# Patient Record
Sex: Female | Born: 1935 | Race: White | Hispanic: No | Smoking: Never smoker
Health system: Southern US, Community
[De-identification: ages and names within clinical notes are randomized; demographics above are authoritative.]

## PROBLEM LIST (undated history)

## (undated) DIAGNOSIS — N3010 Interstitial cystitis (chronic) without hematuria: Secondary | ICD-10-CM

## (undated) DIAGNOSIS — R109 Unspecified abdominal pain: Secondary | ICD-10-CM

## (undated) DIAGNOSIS — D649 Anemia, unspecified: Secondary | ICD-10-CM

## (undated) DIAGNOSIS — M899 Disorder of bone, unspecified: Secondary | ICD-10-CM

## (undated) DIAGNOSIS — B029 Zoster without complications: Secondary | ICD-10-CM

## (undated) DIAGNOSIS — R319 Hematuria, unspecified: Secondary | ICD-10-CM

## (undated) DIAGNOSIS — D473 Essential (hemorrhagic) thrombocythemia: Secondary | ICD-10-CM

## (undated) DIAGNOSIS — I454 Nonspecific intraventricular block: Secondary | ICD-10-CM

## (undated) DIAGNOSIS — M1990 Unspecified osteoarthritis, unspecified site: Secondary | ICD-10-CM

## (undated) DIAGNOSIS — M949 Disorder of cartilage, unspecified: Secondary | ICD-10-CM

## (undated) DIAGNOSIS — M545 Low back pain: Secondary | ICD-10-CM

## (undated) DIAGNOSIS — I499 Cardiac arrhythmia, unspecified: Secondary | ICD-10-CM

## (undated) DIAGNOSIS — B3321 Viral endocarditis: Secondary | ICD-10-CM

## (undated) DIAGNOSIS — S129XXA Fracture of neck, unspecified, initial encounter: Secondary | ICD-10-CM

## (undated) DIAGNOSIS — E785 Hyperlipidemia, unspecified: Secondary | ICD-10-CM

## (undated) DIAGNOSIS — R413 Other amnesia: Secondary | ICD-10-CM

## (undated) DIAGNOSIS — I4460 Unspecified fascicular block: Secondary | ICD-10-CM

## (undated) DIAGNOSIS — I10 Essential (primary) hypertension: Secondary | ICD-10-CM

## (undated) DIAGNOSIS — M5430 Sciatica, unspecified side: Secondary | ICD-10-CM

## (undated) DIAGNOSIS — R634 Abnormal weight loss: Secondary | ICD-10-CM

## (undated) DIAGNOSIS — K922 Gastrointestinal hemorrhage, unspecified: Secondary | ICD-10-CM

## (undated) DIAGNOSIS — K5730 Diverticulosis of large intestine without perforation or abscess without bleeding: Secondary | ICD-10-CM

## (undated) HISTORY — DX: Interstitial cystitis (chronic) without hematuria: N30.10

## (undated) HISTORY — DX: Unspecified fascicular block: I44.60

## (undated) HISTORY — DX: Disorder of bone, unspecified: M89.9

## (undated) HISTORY — PX: TONSILLECTOMY: SHX5217

## (undated) HISTORY — DX: Diverticulosis of large intestine without perforation or abscess without bleeding: K57.30

## (undated) HISTORY — DX: Low back pain: M54.5

## (undated) HISTORY — DX: Anemia, unspecified: D64.9

## (undated) HISTORY — DX: Essential (primary) hypertension: I10

## (undated) HISTORY — DX: Essential (hemorrhagic) thrombocythemia: D47.3

## (undated) HISTORY — DX: Hyperlipidemia, unspecified: E78.5

## (undated) HISTORY — DX: Unspecified osteoarthritis, unspecified site: M19.90

## (undated) HISTORY — DX: Zoster without complications: B02.9

## (undated) HISTORY — PX: CATARACT EXTRACTION W/ INTRAOCULAR LENS  IMPLANT, BILATERAL: SHX1307

## (undated) HISTORY — DX: Sciatica, unspecified side: M54.30

## (undated) HISTORY — DX: Hematuria, unspecified: R31.9

## (undated) HISTORY — DX: Unspecified abdominal pain: R10.9

## (undated) HISTORY — PX: APPENDECTOMY: SHX54

## (undated) HISTORY — DX: Other amnesia: R41.3

## (undated) HISTORY — PX: ABDOMINAL HYSTERECTOMY: SHX81

## (undated) HISTORY — DX: Disorder of cartilage, unspecified: M94.9

## (undated) HISTORY — DX: Fracture of neck, unspecified, initial encounter: S12.9XXA

## (undated) HISTORY — DX: Nonspecific intraventricular block: I45.4

## (undated) HISTORY — PX: BREAST CYST EXCISION: SHX579

## (undated) HISTORY — DX: Cardiac arrhythmia, unspecified: I49.9

## (undated) HISTORY — DX: Viral endocarditis: B33.21

## (undated) HISTORY — DX: Abnormal weight loss: R63.4

## (undated) HISTORY — DX: Gastrointestinal hemorrhage, unspecified: K92.2

---

## 1970-03-03 DIAGNOSIS — B3321 Viral endocarditis: Secondary | ICD-10-CM

## 1970-03-03 HISTORY — DX: Viral endocarditis: B33.21

## 2004-03-03 DIAGNOSIS — B029 Zoster without complications: Secondary | ICD-10-CM

## 2004-03-03 HISTORY — DX: Zoster without complications: B02.9

## 2004-04-22 DIAGNOSIS — R319 Hematuria, unspecified: Secondary | ICD-10-CM

## 2004-04-22 HISTORY — DX: Hematuria, unspecified: R31.9

## 2006-03-03 DIAGNOSIS — I499 Cardiac arrhythmia, unspecified: Secondary | ICD-10-CM

## 2006-03-03 DIAGNOSIS — M1990 Unspecified osteoarthritis, unspecified site: Secondary | ICD-10-CM

## 2006-03-03 DIAGNOSIS — I454 Nonspecific intraventricular block: Secondary | ICD-10-CM

## 2006-03-03 DIAGNOSIS — N3010 Interstitial cystitis (chronic) without hematuria: Secondary | ICD-10-CM

## 2006-03-03 HISTORY — DX: Interstitial cystitis (chronic) without hematuria: N30.10

## 2006-03-03 HISTORY — DX: Unspecified osteoarthritis, unspecified site: M19.90

## 2006-03-03 HISTORY — DX: Nonspecific intraventricular block: I45.4

## 2006-03-03 HISTORY — DX: Cardiac arrhythmia, unspecified: I49.9

## 2006-09-01 DIAGNOSIS — E785 Hyperlipidemia, unspecified: Secondary | ICD-10-CM

## 2006-09-01 HISTORY — DX: Hyperlipidemia, unspecified: E78.5

## 2007-03-09 DIAGNOSIS — K922 Gastrointestinal hemorrhage, unspecified: Secondary | ICD-10-CM

## 2007-03-09 HISTORY — DX: Gastrointestinal hemorrhage, unspecified: K92.2

## 2007-04-20 DIAGNOSIS — K5730 Diverticulosis of large intestine without perforation or abscess without bleeding: Secondary | ICD-10-CM

## 2007-04-20 DIAGNOSIS — M899 Disorder of bone, unspecified: Secondary | ICD-10-CM

## 2007-04-20 HISTORY — DX: Diverticulosis of large intestine without perforation or abscess without bleeding: K57.30

## 2007-04-20 HISTORY — DX: Disorder of bone, unspecified: M89.9

## 2009-12-19 DIAGNOSIS — R413 Other amnesia: Secondary | ICD-10-CM

## 2009-12-19 HISTORY — DX: Other amnesia: R41.3

## 2010-06-19 DIAGNOSIS — M5430 Sciatica, unspecified side: Secondary | ICD-10-CM

## 2010-06-19 HISTORY — DX: Sciatica, unspecified side: M54.30

## 2010-12-18 DIAGNOSIS — M545 Low back pain, unspecified: Secondary | ICD-10-CM

## 2010-12-18 HISTORY — DX: Low back pain, unspecified: M54.50

## 2011-06-18 ENCOUNTER — Other Ambulatory Visit: Payer: Self-pay | Admitting: Internal Medicine

## 2011-06-18 DIAGNOSIS — D649 Anemia, unspecified: Secondary | ICD-10-CM

## 2011-06-18 DIAGNOSIS — R634 Abnormal weight loss: Secondary | ICD-10-CM | POA: Insufficient documentation

## 2011-06-18 DIAGNOSIS — D473 Essential (hemorrhagic) thrombocythemia: Secondary | ICD-10-CM

## 2011-06-18 DIAGNOSIS — I4460 Unspecified fascicular block: Secondary | ICD-10-CM

## 2011-06-18 HISTORY — DX: Unspecified fascicular block: I44.60

## 2011-06-18 HISTORY — DX: Anemia, unspecified: D64.9

## 2011-06-18 HISTORY — DX: Essential (hemorrhagic) thrombocythemia: D47.3

## 2011-06-18 HISTORY — DX: Abnormal weight loss: R63.4

## 2011-06-19 ENCOUNTER — Ambulatory Visit
Admission: RE | Admit: 2011-06-19 | Discharge: 2011-06-19 | Disposition: A | Discharge status: Home / Self Care | Payer: Medicare Other | Source: Ambulatory Visit | Attending: Internal Medicine | Admitting: Internal Medicine

## 2011-06-26 ENCOUNTER — Other Ambulatory Visit: Payer: Self-pay | Admitting: Hematology and Oncology

## 2011-06-26 ENCOUNTER — Encounter (HOSPITAL_BASED_OUTPATIENT_CLINIC_OR_DEPARTMENT_OTHER): Payer: Medicare Other | Admitting: Hematology and Oncology

## 2011-06-26 ENCOUNTER — Encounter: Payer: Medicare Other | Admitting: Hematology and Oncology

## 2011-06-26 DIAGNOSIS — D649 Anemia, unspecified: Secondary | ICD-10-CM

## 2011-06-26 DIAGNOSIS — D473 Essential (hemorrhagic) thrombocythemia: Secondary | ICD-10-CM

## 2011-06-26 LAB — COMPREHENSIVE METABOLIC PANEL
ALT: 12 U/L (ref 0–35)
Albumin: 3.8 g/dL (ref 3.5–5.2)
CO2: 29 mEq/L (ref 19–32)
Chloride: 103 mEq/L (ref 96–112)
Glucose, Bld: 83 mg/dL (ref 70–99)
Potassium: 3.9 mEq/L (ref 3.5–5.3)
Sodium: 142 mEq/L (ref 135–145)
Total Protein: 6.7 g/dL (ref 6.0–8.3)

## 2011-06-26 LAB — CBC WITH DIFFERENTIAL/PLATELET
BASO%: 0.9 % (ref 0.0–2.0)
Basophils Absolute: 0.1 10e3/uL (ref 0.0–0.1)
EOS%: 1.9 % (ref 0.0–7.0)
Eosinophils Absolute: 0.1 10e3/uL (ref 0.0–0.5)
HCT: 35.2 % (ref 34.8–46.6)
HGB: 11.4 g/dL — ABNORMAL LOW (ref 11.6–15.9)
LYMPH%: 20.9 % (ref 14.0–49.7)
MCH: 29.9 pg (ref 25.1–34.0)
MCHC: 32.4 g/dL (ref 31.5–36.0)
MCV: 92.4 fL (ref 79.5–101.0)
MONO#: 0.6 10e3/uL (ref 0.1–0.9)
MONO%: 8.7 % (ref 0.0–14.0)
NEUT#: 4.3 10e3/uL (ref 1.5–6.5)
NEUT%: 67.6 % (ref 38.4–76.8)
Platelets: 1064 10e3/uL — ABNORMAL HIGH (ref 145–400)
RBC: 3.81 10e6/uL (ref 3.70–5.45)
RDW: 16.4 % — ABNORMAL HIGH (ref 11.2–14.5)
WBC: 6.4 10e3/uL (ref 3.9–10.3)
lymph#: 1.3 10e3/uL (ref 0.9–3.3)
nRBC: 0 % (ref 0–0)

## 2011-06-26 LAB — URINALYSIS, MICROSCOPIC - CHCC
Bilirubin (Urine): NEGATIVE
Ketones: NEGATIVE mg/dL
Specific Gravity, Urine: 1.01 (ref 1.003–1.035)

## 2011-06-26 LAB — MORPHOLOGY: PLT EST: INCREASED

## 2011-06-26 LAB — LACTATE DEHYDROGENASE: LDH: 585 U/L — ABNORMAL HIGH (ref 94–250)

## 2011-06-30 ENCOUNTER — Other Ambulatory Visit: Payer: Self-pay | Admitting: Hematology and Oncology

## 2011-06-30 DIAGNOSIS — D649 Anemia, unspecified: Secondary | ICD-10-CM

## 2011-06-30 LAB — JAK-2 V617F

## 2011-06-30 LAB — PROTEIN ELECTROPHORESIS, SERUM, WITH REFLEX
Alpha-1-Globulin: 5.4 % — ABNORMAL HIGH (ref 2.9–4.9)
Beta 2: 4.1 % (ref 3.2–6.5)
Gamma Globulin: 9.8 % — ABNORMAL LOW (ref 11.1–18.8)

## 2011-06-30 LAB — IRON AND TIBC
%SAT: 23 % (ref 20–55)
Iron: 84 ug/dL (ref 42–145)
TIBC: 364 ug/dL (ref 250–470)
UIBC: 280 ug/dL

## 2011-06-30 LAB — FERRITIN: Ferritin: 109 ng/mL (ref 10–291)

## 2011-07-01 ENCOUNTER — Encounter (HOSPITAL_BASED_OUTPATIENT_CLINIC_OR_DEPARTMENT_OTHER): Payer: Medicare Other | Admitting: Hematology and Oncology

## 2011-07-01 DIAGNOSIS — D473 Essential (hemorrhagic) thrombocythemia: Secondary | ICD-10-CM

## 2011-07-01 DIAGNOSIS — D649 Anemia, unspecified: Secondary | ICD-10-CM

## 2011-07-03 ENCOUNTER — Encounter (HOSPITAL_BASED_OUTPATIENT_CLINIC_OR_DEPARTMENT_OTHER): Payer: Medicare Other | Admitting: Hematology and Oncology

## 2011-07-03 ENCOUNTER — Other Ambulatory Visit: Payer: Self-pay | Admitting: Hematology and Oncology

## 2011-07-03 DIAGNOSIS — D649 Anemia, unspecified: Secondary | ICD-10-CM

## 2011-07-03 DIAGNOSIS — D473 Essential (hemorrhagic) thrombocythemia: Secondary | ICD-10-CM

## 2011-07-03 LAB — CBC WITH DIFFERENTIAL/PLATELET
Basophils Absolute: 0.1 10*3/uL (ref 0.0–0.1)
EOS%: 2.5 % (ref 0.0–7.0)
MCH: 29.3 pg (ref 25.1–34.0)
MCV: 92.3 fL (ref 79.5–101.0)
MONO%: 5.2 % (ref 0.0–14.0)
RBC: 3.65 10*6/uL — ABNORMAL LOW (ref 3.70–5.45)
RDW: 16.2 % — ABNORMAL HIGH (ref 11.2–14.5)
nRBC: 0 % (ref 0–0)

## 2011-07-08 ENCOUNTER — Encounter (HOSPITAL_BASED_OUTPATIENT_CLINIC_OR_DEPARTMENT_OTHER): Payer: Medicare Other | Admitting: Hematology and Oncology

## 2011-07-08 ENCOUNTER — Other Ambulatory Visit: Payer: Self-pay | Admitting: Hematology and Oncology

## 2011-07-08 DIAGNOSIS — D473 Essential (hemorrhagic) thrombocythemia: Secondary | ICD-10-CM

## 2011-07-08 DIAGNOSIS — D649 Anemia, unspecified: Secondary | ICD-10-CM

## 2011-07-08 LAB — CBC WITH DIFFERENTIAL/PLATELET
BASO%: 0.3 % (ref 0.0–2.0)
EOS%: 1.7 % (ref 0.0–7.0)
HCT: 32.2 % — ABNORMAL LOW (ref 34.8–46.6)
MCH: 30.8 pg (ref 25.1–34.0)
MCHC: 33.7 g/dL (ref 31.5–36.0)
MONO%: 1.2 % (ref 0.0–14.0)
NEUT%: 82.6 % — ABNORMAL HIGH (ref 38.4–76.8)
lymph#: 0.6 10*3/uL — ABNORMAL LOW (ref 0.9–3.3)

## 2011-07-08 LAB — BCR/ABL (LIO MMD)

## 2011-07-09 ENCOUNTER — Other Ambulatory Visit: Payer: Self-pay | Admitting: Hematology and Oncology

## 2011-07-09 ENCOUNTER — Encounter (HOSPITAL_BASED_OUTPATIENT_CLINIC_OR_DEPARTMENT_OTHER): Payer: Medicare Other | Admitting: Hematology and Oncology

## 2011-07-09 DIAGNOSIS — D649 Anemia, unspecified: Secondary | ICD-10-CM

## 2011-07-09 DIAGNOSIS — D473 Essential (hemorrhagic) thrombocythemia: Secondary | ICD-10-CM

## 2011-07-09 LAB — CBC WITH DIFFERENTIAL/PLATELET
BASO%: 0.8 % (ref 0.0–2.0)
Basophils Absolute: 0 10*3/uL (ref 0.0–0.1)
EOS%: 1.2 % (ref 0.0–7.0)
HGB: 10.8 g/dL — ABNORMAL LOW (ref 11.6–15.9)
MCH: 31 pg (ref 25.1–34.0)
MCHC: 33.7 g/dL (ref 31.5–36.0)
RBC: 3.49 10*6/uL — ABNORMAL LOW (ref 3.70–5.45)
RDW: 16.8 % — ABNORMAL HIGH (ref 11.2–14.5)
lymph#: 0.5 10*3/uL — ABNORMAL LOW (ref 0.9–3.3)

## 2011-07-14 ENCOUNTER — Other Ambulatory Visit: Payer: Self-pay | Admitting: Hematology and Oncology

## 2011-07-14 ENCOUNTER — Encounter (HOSPITAL_BASED_OUTPATIENT_CLINIC_OR_DEPARTMENT_OTHER): Payer: Medicare Other | Admitting: Hematology and Oncology

## 2011-07-14 LAB — CBC WITH DIFFERENTIAL/PLATELET
Basophils Absolute: 0 10*3/uL (ref 0.0–0.1)
Eosinophils Absolute: 0 10*3/uL (ref 0.0–0.5)
HGB: 10.1 g/dL — ABNORMAL LOW (ref 11.6–15.9)
MONO#: 0 10*3/uL — ABNORMAL LOW (ref 0.1–0.9)
NEUT#: 0.3 10*3/uL — CL (ref 1.5–6.5)
RDW: 15.2 % — ABNORMAL HIGH (ref 11.2–14.5)
WBC: 1 10*3/uL — ABNORMAL LOW (ref 3.9–10.3)
lymph#: 0.7 10*3/uL — ABNORMAL LOW (ref 0.9–3.3)
nRBC: 0 % (ref 0–0)

## 2011-07-17 ENCOUNTER — Encounter: Payer: Medicare Other | Admitting: Hematology and Oncology

## 2011-07-17 ENCOUNTER — Other Ambulatory Visit: Payer: Self-pay | Admitting: Hematology and Oncology

## 2011-07-17 DIAGNOSIS — D473 Essential (hemorrhagic) thrombocythemia: Secondary | ICD-10-CM

## 2011-07-17 DIAGNOSIS — D649 Anemia, unspecified: Secondary | ICD-10-CM

## 2011-07-17 LAB — CBC WITH DIFFERENTIAL/PLATELET
BASO%: 3.1 % — ABNORMAL HIGH (ref 0.0–2.0)
Basophils Absolute: 0 10*3/uL (ref 0.0–0.1)
HCT: 29.5 % — ABNORMAL LOW (ref 34.8–46.6)
HGB: 10.2 g/dL — ABNORMAL LOW (ref 11.6–15.9)
MONO#: 0 10*3/uL — ABNORMAL LOW (ref 0.1–0.9)
NEUT%: 21.8 % — ABNORMAL LOW (ref 38.4–76.8)
RDW: 16.3 % — ABNORMAL HIGH (ref 11.2–14.5)
WBC: 1 10*3/uL — ABNORMAL LOW (ref 3.9–10.3)
lymph#: 0.7 10*3/uL — ABNORMAL LOW (ref 0.9–3.3)

## 2011-07-21 ENCOUNTER — Other Ambulatory Visit: Payer: Self-pay | Admitting: Hematology and Oncology

## 2011-07-21 ENCOUNTER — Encounter (HOSPITAL_BASED_OUTPATIENT_CLINIC_OR_DEPARTMENT_OTHER): Payer: Medicare Other | Admitting: Hematology and Oncology

## 2011-07-21 DIAGNOSIS — D649 Anemia, unspecified: Secondary | ICD-10-CM

## 2011-07-21 DIAGNOSIS — D473 Essential (hemorrhagic) thrombocythemia: Secondary | ICD-10-CM

## 2011-07-21 LAB — CBC WITH DIFFERENTIAL/PLATELET
BASO%: 1.6 % (ref 0.0–2.0)
Basophils Absolute: 0 10e3/uL (ref 0.0–0.1)
EOS%: 0.7 % (ref 0.0–7.0)
Eosinophils Absolute: 0 10e3/uL (ref 0.0–0.5)
HCT: 29.1 % — ABNORMAL LOW (ref 34.8–46.6)
HGB: 9.9 g/dL — ABNORMAL LOW (ref 11.6–15.9)
LYMPH%: 45 % (ref 14.0–49.7)
MCH: 31.5 pg (ref 25.1–34.0)
MCHC: 33.9 g/dL (ref 31.5–36.0)
MCV: 92.9 fL (ref 79.5–101.0)
MONO#: 0.2 10e3/uL (ref 0.1–0.9)
MONO%: 11.4 % (ref 0.0–14.0)
NEUT#: 0.8 10e3/uL — ABNORMAL LOW (ref 1.5–6.5)
NEUT%: 41.3 % (ref 38.4–76.8)
Platelets: 106 10e3/uL — ABNORMAL LOW (ref 145–400)
RBC: 3.13 10e6/uL — ABNORMAL LOW (ref 3.70–5.45)
RDW: 16.3 % — ABNORMAL HIGH (ref 11.2–14.5)
WBC: 1.9 10e3/uL — ABNORMAL LOW (ref 3.9–10.3)
lymph#: 0.8 10e3/uL — ABNORMAL LOW (ref 0.9–3.3)

## 2011-07-28 ENCOUNTER — Encounter (HOSPITAL_BASED_OUTPATIENT_CLINIC_OR_DEPARTMENT_OTHER): Payer: Medicare Other | Admitting: Hematology and Oncology

## 2011-07-28 ENCOUNTER — Other Ambulatory Visit: Payer: Self-pay | Admitting: Hematology and Oncology

## 2011-07-28 DIAGNOSIS — D649 Anemia, unspecified: Secondary | ICD-10-CM

## 2011-07-28 DIAGNOSIS — D473 Essential (hemorrhagic) thrombocythemia: Secondary | ICD-10-CM

## 2011-07-28 LAB — CBC WITH DIFFERENTIAL/PLATELET
Basophils Absolute: 0 10*3/uL (ref 0.0–0.1)
EOS%: 1.9 % (ref 0.0–7.0)
Eosinophils Absolute: 0.1 10*3/uL (ref 0.0–0.5)
HGB: 10.1 g/dL — ABNORMAL LOW (ref 11.6–15.9)
LYMPH%: 28 % (ref 14.0–49.7)
MCH: 32.6 pg (ref 25.1–34.0)
MCV: 95.9 fL (ref 79.5–101.0)
MONO%: 9.2 % (ref 0.0–14.0)
NEUT#: 2 10*3/uL (ref 1.5–6.5)
NEUT%: 60.4 % (ref 38.4–76.8)
Platelets: 429 10*3/uL — ABNORMAL HIGH (ref 145–400)
RDW: 18.8 % — ABNORMAL HIGH (ref 11.2–14.5)

## 2011-08-05 ENCOUNTER — Encounter (HOSPITAL_BASED_OUTPATIENT_CLINIC_OR_DEPARTMENT_OTHER): Payer: Medicare Other | Admitting: Hematology and Oncology

## 2011-08-05 ENCOUNTER — Other Ambulatory Visit: Payer: Self-pay | Admitting: Hematology and Oncology

## 2011-08-05 DIAGNOSIS — D473 Essential (hemorrhagic) thrombocythemia: Secondary | ICD-10-CM

## 2011-08-05 DIAGNOSIS — D649 Anemia, unspecified: Secondary | ICD-10-CM

## 2011-08-05 LAB — CBC WITH DIFFERENTIAL/PLATELET
BASO%: 0.4 % (ref 0.0–2.0)
Basophils Absolute: 0 10*3/uL (ref 0.0–0.1)
MCH: 32.3 pg (ref 25.1–34.0)
MCHC: 33.4 g/dL (ref 31.5–36.0)
MCV: 96.7 fL (ref 79.5–101.0)
MONO#: 0.7 10*3/uL (ref 0.1–0.9)
NEUT#: 4.2 10*3/uL (ref 1.5–6.5)
NEUT%: 64.9 % (ref 38.4–76.8)
Platelets: 736 10*3/uL — ABNORMAL HIGH (ref 145–400)
RBC: 3.28 10*6/uL — ABNORMAL LOW (ref 3.70–5.45)
lymph#: 1.5 10*3/uL (ref 0.9–3.3)

## 2011-08-05 LAB — BASIC METABOLIC PANEL
BUN: 17 mg/dL (ref 6–23)
CO2: 26 mEq/L (ref 19–32)
Chloride: 106 mEq/L (ref 96–112)
Creatinine, Ser: 0.9 mg/dL (ref 0.50–1.10)

## 2011-08-12 ENCOUNTER — Other Ambulatory Visit: Payer: Self-pay | Admitting: Hematology and Oncology

## 2011-08-12 ENCOUNTER — Encounter (HOSPITAL_BASED_OUTPATIENT_CLINIC_OR_DEPARTMENT_OTHER): Payer: Medicare Other | Admitting: Hematology and Oncology

## 2011-08-12 DIAGNOSIS — D473 Essential (hemorrhagic) thrombocythemia: Secondary | ICD-10-CM

## 2011-08-12 DIAGNOSIS — D649 Anemia, unspecified: Secondary | ICD-10-CM

## 2011-08-12 LAB — CBC WITH DIFFERENTIAL/PLATELET
Basophils Absolute: 0 10*3/uL (ref 0.0–0.1)
EOS%: 0.7 % (ref 0.0–7.0)
HGB: 10.9 g/dL — ABNORMAL LOW (ref 11.6–15.9)
LYMPH%: 22.5 % (ref 14.0–49.7)
MCH: 32.9 pg (ref 25.1–34.0)
MCV: 96.5 fL (ref 79.5–101.0)
MONO%: 8 % (ref 0.0–14.0)
RDW: 21.4 % — ABNORMAL HIGH (ref 11.2–14.5)

## 2011-08-19 ENCOUNTER — Encounter (HOSPITAL_BASED_OUTPATIENT_CLINIC_OR_DEPARTMENT_OTHER): Payer: Medicare Other | Admitting: Hematology and Oncology

## 2011-08-19 ENCOUNTER — Other Ambulatory Visit: Payer: Self-pay | Admitting: Hematology and Oncology

## 2011-08-19 DIAGNOSIS — D649 Anemia, unspecified: Secondary | ICD-10-CM

## 2011-08-19 DIAGNOSIS — D473 Essential (hemorrhagic) thrombocythemia: Secondary | ICD-10-CM

## 2011-08-19 LAB — CBC WITH DIFFERENTIAL/PLATELET
BASO%: 0.4 % (ref 0.0–2.0)
Basophils Absolute: 0 10*3/uL (ref 0.0–0.1)
EOS%: 1.6 % (ref 0.0–7.0)
HCT: 34.2 % — ABNORMAL LOW (ref 34.8–46.6)
HGB: 11.4 g/dL — ABNORMAL LOW (ref 11.6–15.9)
MCH: 32.7 pg (ref 25.1–34.0)
MCHC: 33.2 g/dL (ref 31.5–36.0)
MONO#: 0.3 10*3/uL (ref 0.1–0.9)
NEUT%: 71.4 % (ref 38.4–76.8)
RDW: 22.3 % — ABNORMAL HIGH (ref 11.2–14.5)
WBC: 6.2 10*3/uL (ref 3.9–10.3)
lymph#: 1.3 10*3/uL (ref 0.9–3.3)

## 2011-08-26 ENCOUNTER — Encounter (HOSPITAL_BASED_OUTPATIENT_CLINIC_OR_DEPARTMENT_OTHER): Payer: Medicare Other | Admitting: Hematology and Oncology

## 2011-08-26 ENCOUNTER — Other Ambulatory Visit: Payer: Self-pay | Admitting: Hematology and Oncology

## 2011-08-26 DIAGNOSIS — D473 Essential (hemorrhagic) thrombocythemia: Secondary | ICD-10-CM

## 2011-08-26 DIAGNOSIS — D649 Anemia, unspecified: Secondary | ICD-10-CM

## 2011-08-26 LAB — CBC WITH DIFFERENTIAL/PLATELET
Basophils Absolute: 0 10*3/uL (ref 0.0–0.1)
Eosinophils Absolute: 0.2 10*3/uL (ref 0.0–0.5)
HCT: 34.7 % — ABNORMAL LOW (ref 34.8–46.6)
HGB: 11.7 g/dL (ref 11.6–15.9)
NEUT#: 4.1 10*3/uL (ref 1.5–6.5)
NEUT%: 69.3 % (ref 38.4–76.8)
RDW: 24 % — ABNORMAL HIGH (ref 11.2–14.5)
lymph#: 1.1 10*3/uL (ref 0.9–3.3)

## 2011-09-09 ENCOUNTER — Other Ambulatory Visit: Payer: Self-pay | Admitting: Hematology and Oncology

## 2011-09-09 ENCOUNTER — Encounter (HOSPITAL_BASED_OUTPATIENT_CLINIC_OR_DEPARTMENT_OTHER): Payer: Medicare Other | Admitting: Hematology and Oncology

## 2011-09-09 DIAGNOSIS — D473 Essential (hemorrhagic) thrombocythemia: Secondary | ICD-10-CM

## 2011-09-09 DIAGNOSIS — D649 Anemia, unspecified: Secondary | ICD-10-CM

## 2011-09-09 LAB — CBC WITH DIFFERENTIAL/PLATELET
Basophils Absolute: 0 10*3/uL (ref 0.0–0.1)
Eosinophils Absolute: 0.1 10*3/uL (ref 0.0–0.5)
HCT: 33.6 % — ABNORMAL LOW (ref 34.8–46.6)
HGB: 11.1 g/dL — ABNORMAL LOW (ref 11.6–15.9)
LYMPH%: 25.9 % (ref 14.0–49.7)
MCH: 34.2 pg — ABNORMAL HIGH (ref 25.1–34.0)
MCV: 103.2 fL — ABNORMAL HIGH (ref 79.5–101.0)
MONO%: 6.8 % (ref 0.0–14.0)
NEUT#: 3.3 10*3/uL (ref 1.5–6.5)
NEUT%: 64.5 % (ref 38.4–76.8)
Platelets: 387 10*3/uL (ref 145–400)

## 2011-09-09 LAB — BASIC METABOLIC PANEL
BUN: 22 mg/dL (ref 6–23)
Creatinine, Ser: 0.87 mg/dL (ref 0.50–1.10)
Glucose, Bld: 98 mg/dL (ref 70–99)
Potassium: 4.1 mEq/L (ref 3.5–5.3)

## 2011-09-23 ENCOUNTER — Other Ambulatory Visit (HOSPITAL_BASED_OUTPATIENT_CLINIC_OR_DEPARTMENT_OTHER): Payer: Medicare Other

## 2011-09-23 ENCOUNTER — Other Ambulatory Visit: Payer: Self-pay | Admitting: Hematology and Oncology

## 2011-09-23 ENCOUNTER — Telehealth: Payer: Self-pay | Admitting: Nurse Practitioner

## 2011-09-23 DIAGNOSIS — D473 Essential (hemorrhagic) thrombocythemia: Secondary | ICD-10-CM

## 2011-09-23 DIAGNOSIS — D649 Anemia, unspecified: Secondary | ICD-10-CM

## 2011-09-23 LAB — CBC WITH DIFFERENTIAL/PLATELET
BASO%: 0.9 % (ref 0.0–2.0)
LYMPH%: 24 % (ref 14.0–49.7)
MCHC: 33.6 g/dL (ref 31.5–36.0)
MONO#: 0.4 10*3/uL (ref 0.1–0.9)
MONO%: 9.9 % (ref 0.0–14.0)
Platelets: 321 10*3/uL (ref 145–400)
RBC: 3.31 10*6/uL — ABNORMAL LOW (ref 3.70–5.45)
RDW: 27.8 % — ABNORMAL HIGH (ref 11.2–14.5)
WBC: 4.1 10*3/uL (ref 3.9–10.3)

## 2011-09-23 NOTE — Telephone Encounter (Signed)
Spoke with patient.  Instructed per Dr. Dalene Carrow- continue with Hydrea 1000mg /500mg  alternating daily.  Confirmed next lab appointment for 10/07/11.

## 2011-10-07 ENCOUNTER — Other Ambulatory Visit (HOSPITAL_BASED_OUTPATIENT_CLINIC_OR_DEPARTMENT_OTHER): Payer: Medicare Other | Admitting: Lab

## 2011-10-07 ENCOUNTER — Telehealth: Payer: Self-pay | Admitting: *Deleted

## 2011-10-07 ENCOUNTER — Other Ambulatory Visit: Payer: Self-pay | Admitting: *Deleted

## 2011-10-07 ENCOUNTER — Other Ambulatory Visit: Payer: Self-pay | Admitting: Hematology and Oncology

## 2011-10-07 DIAGNOSIS — D649 Anemia, unspecified: Secondary | ICD-10-CM

## 2011-10-07 DIAGNOSIS — D473 Essential (hemorrhagic) thrombocythemia: Secondary | ICD-10-CM

## 2011-10-07 LAB — CBC WITH DIFFERENTIAL/PLATELET
Eosinophils Absolute: 0.1 10*3/uL (ref 0.0–0.5)
HGB: 11.9 g/dL (ref 11.6–15.9)
MONO#: 0.3 10*3/uL (ref 0.1–0.9)
NEUT#: 2.7 10*3/uL (ref 1.5–6.5)
RBC: 3.18 10*6/uL — ABNORMAL LOW (ref 3.70–5.45)
RDW: 26.9 % — ABNORMAL HIGH (ref 11.2–14.5)
WBC: 4.2 10*3/uL (ref 3.9–10.3)

## 2011-10-07 NOTE — Telephone Encounter (Signed)
Dr. Dalene Carrow reviewed lab results today.   Spoke with pt at home and instructed pt re:   Continue with  Hydrea  500 mg  Alternate with  1000 mg  As per md.    Gave pt date and time for lab  In 1 month  11/06/11  At  10  AM.

## 2011-10-08 ENCOUNTER — Telehealth: Payer: Self-pay | Admitting: Hematology and Oncology

## 2011-10-08 NOTE — Telephone Encounter (Signed)
lmonvm for pt re appt for 12/28 @ 10 am d/t per orders

## 2011-10-08 NOTE — Telephone Encounter (Signed)
lmonvm for pt re appt for 12/28 @ 10 am. Dec/feb schedule mailed today.

## 2011-11-06 ENCOUNTER — Telehealth: Payer: Self-pay | Admitting: *Deleted

## 2011-11-06 ENCOUNTER — Other Ambulatory Visit (HOSPITAL_BASED_OUTPATIENT_CLINIC_OR_DEPARTMENT_OTHER): Payer: Medicare Other | Admitting: Lab

## 2011-11-06 DIAGNOSIS — D473 Essential (hemorrhagic) thrombocythemia: Secondary | ICD-10-CM

## 2011-11-06 LAB — CBC WITH DIFFERENTIAL/PLATELET
Basophils Absolute: 0 10*3/uL (ref 0.0–0.1)
Eosinophils Absolute: 0.1 10*3/uL (ref 0.0–0.5)
HCT: 35.3 % (ref 34.8–46.6)
HGB: 12.1 g/dL (ref 11.6–15.9)
LYMPH%: 23.8 % (ref 14.0–49.7)
MONO#: 0.3 10*3/uL (ref 0.1–0.9)
NEUT#: 2.5 10*3/uL (ref 1.5–6.5)
NEUT%: 66 % (ref 38.4–76.8)
Platelets: 312 10*3/uL (ref 145–400)
WBC: 3.9 10*3/uL (ref 3.9–10.3)

## 2011-11-06 NOTE — Telephone Encounter (Signed)
Duplicate

## 2011-11-11 ENCOUNTER — Other Ambulatory Visit: Payer: Self-pay | Admitting: Nurse Practitioner

## 2011-11-11 DIAGNOSIS — D473 Essential (hemorrhagic) thrombocythemia: Secondary | ICD-10-CM

## 2011-11-11 MED ORDER — HYDROXYUREA 500 MG PO CAPS: 500.0000 mg | ORAL_CAPSULE | ORAL | Status: DC

## 2011-12-10 DIAGNOSIS — E785 Hyperlipidemia, unspecified: Secondary | ICD-10-CM | POA: Diagnosis not present

## 2011-12-10 DIAGNOSIS — I10 Essential (primary) hypertension: Secondary | ICD-10-CM | POA: Diagnosis not present

## 2011-12-10 DIAGNOSIS — D649 Anemia, unspecified: Secondary | ICD-10-CM | POA: Diagnosis not present

## 2011-12-21 ENCOUNTER — Other Ambulatory Visit: Payer: Self-pay | Admitting: Cardiology

## 2011-12-31 DIAGNOSIS — D473 Essential (hemorrhagic) thrombocythemia: Secondary | ICD-10-CM | POA: Diagnosis not present

## 2011-12-31 DIAGNOSIS — H26499 Other secondary cataract, unspecified eye: Secondary | ICD-10-CM | POA: Diagnosis not present

## 2011-12-31 DIAGNOSIS — H40019 Open angle with borderline findings, low risk, unspecified eye: Secondary | ICD-10-CM | POA: Diagnosis not present

## 2011-12-31 DIAGNOSIS — H43819 Vitreous degeneration, unspecified eye: Secondary | ICD-10-CM | POA: Diagnosis not present

## 2011-12-31 DIAGNOSIS — D649 Anemia, unspecified: Secondary | ICD-10-CM | POA: Diagnosis not present

## 2011-12-31 DIAGNOSIS — E785 Hyperlipidemia, unspecified: Secondary | ICD-10-CM | POA: Diagnosis not present

## 2011-12-31 DIAGNOSIS — Z961 Presence of intraocular lens: Secondary | ICD-10-CM | POA: Diagnosis not present

## 2011-12-31 DIAGNOSIS — I10 Essential (primary) hypertension: Secondary | ICD-10-CM | POA: Diagnosis not present

## 2012-01-06 ENCOUNTER — Encounter: Payer: Self-pay | Admitting: Hematology and Oncology

## 2012-01-06 ENCOUNTER — Other Ambulatory Visit (HOSPITAL_BASED_OUTPATIENT_CLINIC_OR_DEPARTMENT_OTHER): Payer: Medicare Other | Admitting: Lab

## 2012-01-06 ENCOUNTER — Ambulatory Visit (HOSPITAL_BASED_OUTPATIENT_CLINIC_OR_DEPARTMENT_OTHER): Payer: Medicare Other | Admitting: Hematology and Oncology

## 2012-01-06 ENCOUNTER — Telehealth: Payer: Self-pay | Admitting: Hematology and Oncology

## 2012-01-06 VITALS — BP 140/74 | HR 55 | Temp 97.4°F | Ht 61.0 in | Wt 100.1 lb

## 2012-01-06 DIAGNOSIS — D649 Anemia, unspecified: Secondary | ICD-10-CM | POA: Diagnosis not present

## 2012-01-06 DIAGNOSIS — H409 Unspecified glaucoma: Secondary | ICD-10-CM | POA: Insufficient documentation

## 2012-01-06 DIAGNOSIS — D473 Essential (hemorrhagic) thrombocythemia: Secondary | ICD-10-CM

## 2012-01-06 LAB — CBC WITH DIFFERENTIAL/PLATELET
Basophils Absolute: 0 10*3/uL (ref 0.0–0.1)
EOS%: 0.8 % (ref 0.0–7.0)
Eosinophils Absolute: 0 10*3/uL (ref 0.0–0.5)
HCT: 34.8 % (ref 34.8–46.6)
HGB: 11.7 g/dL (ref 11.6–15.9)
LYMPH%: 22.8 % (ref 14.0–49.7)
MCH: 42.5 pg — ABNORMAL HIGH (ref 25.1–34.0)
MCV: 126.3 fL — ABNORMAL HIGH (ref 79.5–101.0)
MONO%: 7.3 % (ref 0.0–14.0)
NEUT%: 68.7 % (ref 38.4–76.8)
Platelets: 306 10*3/uL (ref 145–400)

## 2012-01-06 LAB — BASIC METABOLIC PANEL
BUN: 17 mg/dL (ref 6–23)
Creatinine, Ser: 0.85 mg/dL (ref 0.50–1.10)
Glucose, Bld: 80 mg/dL (ref 70–99)

## 2012-01-06 NOTE — Progress Notes (Signed)
CC:   Lenon Curt. Chilton Si, M.D. Excell Seltzer. Annabell Howells, M.D. Georga Hacking, M.D.  IDENTIFYING STATEMENT:  The patient is an 78 year old woman with essential thrombocytosis who presents for followup.  INTERVAL HISTORY:  Mrs. Ishida has no current issues or concerns.  She is tolerating Hydrea with very minimal difficulties.  She denies bursitis.  She has good energy level.  CBC obtained on 01/06/2012:  White cell count 4, hemoglobin 11.7, hematocrit 34, platelets 306 (212).  MEDICATIONS:  Reviewed and updated.  PHYSICAL EXAMINATION:  General Appearance:  The patient is alert and oriented x3.  Vital Signs:  Pulse 55.  Blood pressure 140/74. Temperature 97.4.  Respirations 20.  Weight 100 pounds.  HEENT:  Had is atraumatic, normocephalic.  Sclerae are anicteric.  Mouth is moist. Chest:  Clear.  Abdomen:  Soft.  Bowel sounds present.  Extremities:  No edema.  LABORATORY DATA:  As above.  IMPRESSION AND PLAN:  Mrs. Campus is an 78 year old woman with essential thrombocytosis.  She was initiated on Hydrea on 07/05/2011.  Her lab parameters remain unremarkable.  She will continue with Hydrea at the present dose of 100 mg alternating with 500 mg daily.  She follows up with labs in 2 months' time.  She has a clinic visit in 6 months' time.    ______________________________ Laurice Record, M.D. LIO/MEDQ  D:  01/06/2012  T:  01/06/2012  Job:  [id number]

## 2012-01-06 NOTE — Patient Instructions (Signed)
Patient to follow up as instructed.  

## 2012-01-06 NOTE — Telephone Encounter (Signed)
appts made and printed for may and aug  aom

## 2012-01-06 NOTE — Progress Notes (Signed)
This office note has been dictated.

## 2012-02-11 DIAGNOSIS — H01009 Unspecified blepharitis unspecified eye, unspecified eyelid: Secondary | ICD-10-CM | POA: Diagnosis not present

## 2012-02-11 DIAGNOSIS — H4011X0 Primary open-angle glaucoma, stage unspecified: Secondary | ICD-10-CM | POA: Diagnosis not present

## 2012-02-11 DIAGNOSIS — H409 Unspecified glaucoma: Secondary | ICD-10-CM | POA: Diagnosis not present

## 2012-03-23 ENCOUNTER — Other Ambulatory Visit (HOSPITAL_BASED_OUTPATIENT_CLINIC_OR_DEPARTMENT_OTHER): Payer: Medicare Other | Admitting: Lab

## 2012-03-23 ENCOUNTER — Other Ambulatory Visit: Payer: Self-pay | Admitting: *Deleted

## 2012-03-23 ENCOUNTER — Telehealth: Payer: Self-pay | Admitting: *Deleted

## 2012-03-23 DIAGNOSIS — D473 Essential (hemorrhagic) thrombocythemia: Secondary | ICD-10-CM | POA: Diagnosis not present

## 2012-03-23 LAB — CBC WITH DIFFERENTIAL/PLATELET
BASO%: 0.9 % (ref 0.0–2.0)
Eosinophils Absolute: 0 10*3/uL (ref 0.0–0.5)
LYMPH%: 27.3 % (ref 14.0–49.7)
MCHC: 34.1 g/dL (ref 31.5–36.0)
MCV: 129.9 fL — ABNORMAL HIGH (ref 79.5–101.0)
MONO#: 0.4 10*3/uL (ref 0.1–0.9)
MONO%: 12.9 % (ref 0.0–14.0)
NEUT#: 2 10*3/uL (ref 1.5–6.5)
RBC: 2.6 10*6/uL — ABNORMAL LOW (ref 3.70–5.45)
RDW: 13.3 % (ref 11.2–14.5)
WBC: 3.4 10*3/uL — ABNORMAL LOW (ref 3.9–10.3)
nRBC: 0 % (ref 0–0)

## 2012-03-23 NOTE — Telephone Encounter (Signed)
Dr. Dalene Carrow reviewed lab results today.   Spoke with pt and instructed pt to continue with  Hydrea  500 mg  Alternate with  1000 mg  As per md.   Gave pt date and time for lab in 8 weeks  05/25/12 at 0830 am.   Confirmed f/u appt with pt for 06/24/12.    Pt voiced understanding.

## 2012-04-25 DIAGNOSIS — N3010 Interstitial cystitis (chronic) without hematuria: Secondary | ICD-10-CM | POA: Diagnosis not present

## 2012-05-02 DIAGNOSIS — N390 Urinary tract infection, site not specified: Secondary | ICD-10-CM | POA: Diagnosis not present

## 2012-05-09 DIAGNOSIS — N390 Urinary tract infection, site not specified: Secondary | ICD-10-CM | POA: Diagnosis not present

## 2012-05-25 ENCOUNTER — Other Ambulatory Visit (HOSPITAL_BASED_OUTPATIENT_CLINIC_OR_DEPARTMENT_OTHER): Payer: Medicare Other | Admitting: Lab

## 2012-05-25 ENCOUNTER — Telehealth: Payer: Self-pay | Admitting: *Deleted

## 2012-05-25 DIAGNOSIS — D473 Essential (hemorrhagic) thrombocythemia: Secondary | ICD-10-CM | POA: Diagnosis not present

## 2012-05-25 LAB — CBC WITH DIFFERENTIAL/PLATELET
Basophils Absolute: 0 10*3/uL (ref 0.0–0.1)
Eosinophils Absolute: 0 10*3/uL (ref 0.0–0.5)
HGB: 11.9 g/dL (ref 11.6–15.9)
LYMPH%: 26.2 % (ref 14.0–49.7)
MONO#: 0.3 10*3/uL (ref 0.1–0.9)
NEUT#: 1.8 10*3/uL (ref 1.5–6.5)
Platelets: 243 10*3/uL (ref 145–400)
RBC: 2.7 10*6/uL — ABNORMAL LOW (ref 3.70–5.45)
WBC: 2.9 10*3/uL — ABNORMAL LOW (ref 3.9–10.3)

## 2012-05-25 NOTE — Telephone Encounter (Signed)
Spoke with pt and instructed pt re:  Continue with  Hydrea  500 mg  Alternate with  1000 mg  As per md.   Confirmed next appt  For 06/24/12.   Pt voiced understanding.

## 2012-06-21 DIAGNOSIS — I10 Essential (primary) hypertension: Secondary | ICD-10-CM | POA: Diagnosis not present

## 2012-06-21 DIAGNOSIS — E785 Hyperlipidemia, unspecified: Secondary | ICD-10-CM | POA: Diagnosis not present

## 2012-06-21 DIAGNOSIS — D649 Anemia, unspecified: Secondary | ICD-10-CM | POA: Diagnosis not present

## 2012-06-24 ENCOUNTER — Other Ambulatory Visit (HOSPITAL_BASED_OUTPATIENT_CLINIC_OR_DEPARTMENT_OTHER): Payer: Medicare Other | Admitting: Lab

## 2012-06-24 ENCOUNTER — Ambulatory Visit (HOSPITAL_BASED_OUTPATIENT_CLINIC_OR_DEPARTMENT_OTHER): Payer: Medicare Other | Admitting: Hematology and Oncology

## 2012-06-24 ENCOUNTER — Telehealth: Payer: Self-pay | Admitting: Hematology and Oncology

## 2012-06-24 ENCOUNTER — Encounter: Payer: Self-pay | Admitting: Hematology and Oncology

## 2012-06-24 VITALS — BP 145/68 | HR 54 | Temp 97.0°F | Resp 16 | Ht 61.0 in | Wt 96.0 lb

## 2012-06-24 DIAGNOSIS — D473 Essential (hemorrhagic) thrombocythemia: Secondary | ICD-10-CM | POA: Diagnosis not present

## 2012-06-24 DIAGNOSIS — D649 Anemia, unspecified: Secondary | ICD-10-CM

## 2012-06-24 LAB — BASIC METABOLIC PANEL
BUN: 19 mg/dL (ref 6–23)
CO2: 28 mEq/L (ref 19–32)
Chloride: 106 mEq/L (ref 96–112)
Creatinine, Ser: 0.76 mg/dL (ref 0.50–1.10)
Glucose, Bld: 88 mg/dL (ref 70–99)

## 2012-06-24 LAB — CBC WITH DIFFERENTIAL/PLATELET
Eosinophils Absolute: 0 10*3/uL (ref 0.0–0.5)
HCT: 33.2 % — ABNORMAL LOW (ref 34.8–46.6)
LYMPH%: 18 % (ref 14.0–49.7)
MCHC: 34.3 g/dL (ref 31.5–36.0)
MCV: 124.3 fL — ABNORMAL HIGH (ref 79.5–101.0)
MONO#: 0.3 10*3/uL (ref 0.1–0.9)
NEUT#: 2.8 10*3/uL (ref 1.5–6.5)
NEUT%: 73.7 % (ref 38.4–76.8)
Platelets: 242 10*3/uL (ref 145–400)
WBC: 3.8 10*3/uL — ABNORMAL LOW (ref 3.9–10.3)

## 2012-06-24 NOTE — Telephone Encounter (Signed)
gv pt appt schedule for November 2013 and February 2014.

## 2012-06-24 NOTE — Progress Notes (Signed)
CC:   [name] [name]. [name] [name], M.D. [name] [name]. [name] [name], M.D. [name] [name], M.D.  IDENTIFYING STATEMENT:  The patient is an 78 year old woman with essential thrombocytosis who presents for followup.  INTERVAL HISTORY:  [name] [name] continues on Hydrea, which she is tolerating with very minimal difficulties.  She has good energy levels. She has no bleeding.  Denies mucositis or GI upset.  She has some concerns about ongoing weight loss with lack of appetite.  She is only managing to eat small meals.  CBC obtained 06/24/2012:  White cell count 3.8, hemoglobin 11.4, hematocrit 33.2, platelets 242.  ANC 2800.  MEDICATIONS:  Hydrea 500 mg alternating with 100 mg.  Rest of medicines reviewed and updated.  PHYSICAL EXAMINATION:  General:  The patient is a well-appearing, well- nourished woman in no distress.  Vitals:  Pulse 54, blood pressure 145/68, temperature 97, respirations 16, weight 96 pounds.  HEENT: Sclerae anicteric.  Mouth moist.  Chest:  Clear.  Abdomen:  Soft, nontender.  Bowel sounds present.  Extremities:  No edema.  LABORATORY DATA:  CBC as above.  BMET pending.  IMPRESSION AND PLAN:  [name] [name] is an 78 year old woman with essential thrombocytosis, who was initiated on Hydrea on 07/05/2011.  Her labs remain stable.  She will continue with Hydrea at the current dose.  I have advised her to try and will supplement her diet with a can of Ensure or Boost with each meal.  We will see if this helps.  She also tells me she sees Dr. Frederik Pear next week.  She follows up for labs in 3 months' time.  Has a clinic visit in 6 months' time.    ______________________________ Laurice Record, M.D. LIO/MEDQ  D:  06/24/2012  T:  06/24/2012  Job:  [id number]

## 2012-06-24 NOTE — Patient Instructions (Signed)
Teka Chanda  [id number]  [name] Discharge Instructions  RECOMMENDATIONS MADE BY THE CONSULTANT AND ANY TEST RESULTS WILL BE SENT TO YOUR REFERRING DOCTOR.   EXAM FINDINGS BY MD TODAY AND SIGNS AND SYMPTOMS TO REPORT TO CLINIC OR PRIMARY MD:   Your current list of medications are: Current Outpatient Prescriptions  Medication Sig Dispense Refill  . atenolol (TENORMIN) 50 MG tablet Take 50 mg by mouth daily.       . calcitonin, salmon, (MIACALCIN/FORTICAL) 200 UNIT/ACT nasal spray Place 1 spray into the nose daily. 1 spray in alternate nostril daily.      . Calcium-Vitamin D-Vitamin K (VIACTIV) 500-500-40 MG-UNT-MCG CHEW Chew 1 each by mouth daily.      . flurbiprofen (ANSAID) 100 MG tablet 100 mg daily.       . Glucosamine-Chondroit-Vit C-Mn (GLUCOSAMINE CHONDR 1500 COMPLX) CAPS Take 2 capsules by mouth daily.       . hydroxyurea (HYDREA) 500 MG capsule Take 1 capsule (500 mg total) by mouth as directed.  100 capsule  3  . latanoprost (XALATAN) 0.005 % ophthalmic solution Place 1 drop into both eyes daily.      . propafenone (RYTHMOL) 225 MG tablet 225 mg daily.       Marland Kitchen URELLE (URELLE/URISED) 81 MG TABS Take 1 tablet by mouth daily.       . vitamin E 400 UNIT capsule Take 400 Units by mouth every other day.         INSTRUCTIONS GIVEN AND DISCUSSED:   SPECIAL INSTRUCTIONS/FOLLOW-UP:  See above.  I acknowledge that I have been informed and understand all the instructions given to me and received a copy. I do not have any more questions at this time, but understand that I may call the [facility] at [phone] during business hours should I have any further questions or need assistance in obtaining follow-up care.

## 2012-06-24 NOTE — Progress Notes (Signed)
This office note has been dictated.

## 2012-06-30 DIAGNOSIS — I10 Essential (primary) hypertension: Secondary | ICD-10-CM | POA: Diagnosis not present

## 2012-06-30 DIAGNOSIS — D473 Essential (hemorrhagic) thrombocythemia: Secondary | ICD-10-CM | POA: Diagnosis not present

## 2012-06-30 DIAGNOSIS — E785 Hyperlipidemia, unspecified: Secondary | ICD-10-CM | POA: Diagnosis not present

## 2012-06-30 DIAGNOSIS — D649 Anemia, unspecified: Secondary | ICD-10-CM | POA: Diagnosis not present

## 2012-07-13 DIAGNOSIS — M899 Disorder of bone, unspecified: Secondary | ICD-10-CM | POA: Diagnosis not present

## 2012-07-13 DIAGNOSIS — M949 Disorder of cartilage, unspecified: Secondary | ICD-10-CM | POA: Diagnosis not present

## 2012-07-13 DIAGNOSIS — Z8262 Family history of osteoporosis: Secondary | ICD-10-CM | POA: Diagnosis not present

## 2012-08-02 ENCOUNTER — Other Ambulatory Visit: Payer: Self-pay | Admitting: Hematology and Oncology

## 2012-08-09 DIAGNOSIS — H4011X0 Primary open-angle glaucoma, stage unspecified: Secondary | ICD-10-CM | POA: Diagnosis not present

## 2012-08-09 DIAGNOSIS — H409 Unspecified glaucoma: Secondary | ICD-10-CM | POA: Diagnosis not present

## 2012-08-09 DIAGNOSIS — H26499 Other secondary cataract, unspecified eye: Secondary | ICD-10-CM | POA: Diagnosis not present

## 2012-08-11 DIAGNOSIS — D473 Essential (hemorrhagic) thrombocythemia: Secondary | ICD-10-CM | POA: Diagnosis not present

## 2012-08-11 DIAGNOSIS — N3010 Interstitial cystitis (chronic) without hematuria: Secondary | ICD-10-CM | POA: Diagnosis not present

## 2012-08-11 DIAGNOSIS — R109 Unspecified abdominal pain: Secondary | ICD-10-CM | POA: Diagnosis not present

## 2012-08-11 DIAGNOSIS — K5730 Diverticulosis of large intestine without perforation or abscess without bleeding: Secondary | ICD-10-CM | POA: Diagnosis not present

## 2012-08-11 HISTORY — DX: Unspecified abdominal pain: R10.9

## 2012-09-17 DIAGNOSIS — Z23 Encounter for immunization: Secondary | ICD-10-CM | POA: Diagnosis not present

## 2012-09-27 ENCOUNTER — Other Ambulatory Visit (HOSPITAL_BASED_OUTPATIENT_CLINIC_OR_DEPARTMENT_OTHER): Payer: Medicare Other | Admitting: Lab

## 2012-09-27 DIAGNOSIS — D473 Essential (hemorrhagic) thrombocythemia: Secondary | ICD-10-CM | POA: Diagnosis not present

## 2012-09-27 LAB — CBC WITH DIFFERENTIAL/PLATELET
Basophils Absolute: 0 10*3/uL (ref 0.0–0.1)
EOS%: 1.2 % (ref 0.0–7.0)
MCH: 44.8 pg — ABNORMAL HIGH (ref 25.1–34.0)
MCHC: 34.6 g/dL (ref 31.5–36.0)
MCV: 129.2 fL — ABNORMAL HIGH (ref 79.5–101.0)
MONO%: 8.7 % (ref 0.0–14.0)
RBC: 2.65 10*6/uL — ABNORMAL LOW (ref 3.70–5.45)
RDW: 12.2 % (ref 11.2–14.5)

## 2012-09-28 ENCOUNTER — Telehealth: Payer: Self-pay | Admitting: *Deleted

## 2012-09-28 NOTE — Telephone Encounter (Signed)
Dr. Dalene Carrow reviewed lab results done 09/27/12.   Spoke with pt at home and instructed pt to continue with  Hydrea 500 mg alternate with 1000 mg as per md's instructions.   Confirmed date and time for next appts on 12/27/12.   Pt voiced understanding.

## 2012-10-10 DIAGNOSIS — H5710 Ocular pain, unspecified eye: Secondary | ICD-10-CM | POA: Diagnosis not present

## 2012-11-07 DIAGNOSIS — J029 Acute pharyngitis, unspecified: Secondary | ICD-10-CM | POA: Diagnosis not present

## 2012-11-07 DIAGNOSIS — B9789 Other viral agents as the cause of diseases classified elsewhere: Secondary | ICD-10-CM | POA: Diagnosis not present

## 2012-11-12 ENCOUNTER — Telehealth: Payer: Self-pay | Admitting: Oncology

## 2012-11-12 ENCOUNTER — Encounter: Payer: Self-pay | Admitting: Oncology

## 2012-11-12 NOTE — Telephone Encounter (Signed)
s/w pt and she is aware of her new phy and appt with jh on 2/18.  a letter is mailed and a new sch printed for her.       anne 11/12/12

## 2012-12-20 DIAGNOSIS — I10 Essential (primary) hypertension: Secondary | ICD-10-CM | POA: Diagnosis not present

## 2012-12-20 DIAGNOSIS — D649 Anemia, unspecified: Secondary | ICD-10-CM | POA: Diagnosis not present

## 2012-12-23 ENCOUNTER — Telehealth: Payer: Self-pay | Admitting: Oncology

## 2012-12-23 NOTE — Telephone Encounter (Signed)
Moved to 2/18 appt to 3/5. lmonvm for pt re new appt and mailed schedule. Not able to reach friend listed in EPIC or lm.

## 2012-12-27 ENCOUNTER — Ambulatory Visit: Payer: Medicare Other | Admitting: Family

## 2012-12-27 ENCOUNTER — Ambulatory Visit: Payer: Medicare Other | Admitting: Hematology and Oncology

## 2012-12-27 ENCOUNTER — Other Ambulatory Visit: Payer: Medicare Other | Admitting: Lab

## 2013-01-04 ENCOUNTER — Other Ambulatory Visit: Payer: Medicare Other | Admitting: Lab

## 2013-01-04 ENCOUNTER — Ambulatory Visit: Payer: Medicare Other | Admitting: Physician Assistant

## 2013-01-11 ENCOUNTER — Ambulatory Visit (HOSPITAL_BASED_OUTPATIENT_CLINIC_OR_DEPARTMENT_OTHER): Payer: Medicare Other | Admitting: Physician Assistant

## 2013-01-11 ENCOUNTER — Other Ambulatory Visit (HOSPITAL_BASED_OUTPATIENT_CLINIC_OR_DEPARTMENT_OTHER): Payer: Medicare Other | Admitting: Lab

## 2013-01-11 VITALS — BP 150/68 | HR 52 | Temp 96.8°F | Resp 18 | Ht 61.0 in | Wt 99.4 lb

## 2013-01-11 DIAGNOSIS — D473 Essential (hemorrhagic) thrombocythemia: Secondary | ICD-10-CM | POA: Diagnosis not present

## 2013-01-11 LAB — CBC WITH DIFFERENTIAL/PLATELET
Eosinophils Absolute: 0 10*3/uL (ref 0.0–0.5)
MCV: 131.2 fL — ABNORMAL HIGH (ref 79.5–101.0)
MONO%: 11 % (ref 0.0–14.0)
NEUT#: 2.8 10*3/uL (ref 1.5–6.5)
RBC: 2.92 10*6/uL — ABNORMAL LOW (ref 3.70–5.45)
RDW: 14.9 % — ABNORMAL HIGH (ref 11.2–14.5)
WBC: 4.3 10*3/uL (ref 3.9–10.3)

## 2013-01-11 NOTE — Patient Instructions (Addendum)
Will check CBC every 2 months and will see you in follow up in 6 months with Dr. Arline Asp.
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
## 2013-02-07 DIAGNOSIS — H4011X0 Primary open-angle glaucoma, stage unspecified: Secondary | ICD-10-CM | POA: Diagnosis not present

## 2013-02-07 DIAGNOSIS — Z961 Presence of intraocular lens: Secondary | ICD-10-CM | POA: Diagnosis not present

## 2013-02-07 DIAGNOSIS — H26499 Other secondary cataract, unspecified eye: Secondary | ICD-10-CM | POA: Diagnosis not present

## 2013-03-13 ENCOUNTER — Other Ambulatory Visit (HOSPITAL_BASED_OUTPATIENT_CLINIC_OR_DEPARTMENT_OTHER): Payer: Medicare Other | Admitting: Lab

## 2013-03-13 DIAGNOSIS — D473 Essential (hemorrhagic) thrombocythemia: Secondary | ICD-10-CM | POA: Diagnosis not present

## 2013-03-13 LAB — CBC WITH DIFFERENTIAL/PLATELET
Eosinophils Absolute: 0 10*3/uL (ref 0.0–0.5)
LYMPH%: 21.5 % (ref 14.0–49.7)
MONO#: 0.4 10*3/uL (ref 0.1–0.9)
NEUT#: 2.6 10*3/uL (ref 1.5–6.5)
Platelets: 262 10*3/uL (ref 145–400)
RBC: 2.74 10*6/uL — ABNORMAL LOW (ref 3.70–5.45)
RDW: 13.4 % (ref 11.2–14.5)
WBC: 3.8 10*3/uL — ABNORMAL LOW (ref 3.9–10.3)
lymph#: 0.8 10*3/uL — ABNORMAL LOW (ref 0.9–3.3)

## 2013-03-17 DIAGNOSIS — K219 Gastro-esophageal reflux disease without esophagitis: Secondary | ICD-10-CM | POA: Diagnosis not present

## 2013-03-17 DIAGNOSIS — N390 Urinary tract infection, site not specified: Secondary | ICD-10-CM | POA: Diagnosis not present

## 2013-03-17 DIAGNOSIS — R319 Hematuria, unspecified: Secondary | ICD-10-CM | POA: Diagnosis not present

## 2013-03-17 DIAGNOSIS — R109 Unspecified abdominal pain: Secondary | ICD-10-CM | POA: Diagnosis not present

## 2013-03-20 DIAGNOSIS — N3010 Interstitial cystitis (chronic) without hematuria: Secondary | ICD-10-CM | POA: Diagnosis not present

## 2013-03-20 DIAGNOSIS — N94819 Vulvodynia, unspecified: Secondary | ICD-10-CM | POA: Diagnosis not present

## 2013-04-25 DIAGNOSIS — I10 Essential (primary) hypertension: Secondary | ICD-10-CM | POA: Diagnosis not present

## 2013-04-25 DIAGNOSIS — E785 Hyperlipidemia, unspecified: Secondary | ICD-10-CM | POA: Diagnosis not present

## 2013-05-04 ENCOUNTER — Non-Acute Institutional Stay: Payer: Medicare Other | Admitting: Internal Medicine

## 2013-05-04 ENCOUNTER — Encounter: Payer: Self-pay | Admitting: Internal Medicine

## 2013-05-04 VITALS — BP 124/62 | HR 60 | Ht 61.0 in | Wt 99.5 lb

## 2013-05-04 DIAGNOSIS — R413 Other amnesia: Secondary | ICD-10-CM

## 2013-05-04 DIAGNOSIS — D473 Essential (hemorrhagic) thrombocythemia: Secondary | ICD-10-CM | POA: Diagnosis not present

## 2013-05-04 DIAGNOSIS — D649 Anemia, unspecified: Secondary | ICD-10-CM

## 2013-05-04 DIAGNOSIS — R634 Abnormal weight loss: Secondary | ICD-10-CM | POA: Diagnosis not present

## 2013-05-04 DIAGNOSIS — E785 Hyperlipidemia, unspecified: Secondary | ICD-10-CM

## 2013-05-04 DIAGNOSIS — R490 Dysphonia: Secondary | ICD-10-CM

## 2013-05-04 DIAGNOSIS — I10 Essential (primary) hypertension: Secondary | ICD-10-CM

## 2013-05-04 NOTE — Patient Instructions (Addendum)
Continue current medications. 

## 2013-05-04 NOTE — Progress Notes (Signed)
Subjective:    Patient ID: [name] [name], female    DOB: [birth year], 79 y.o.   MRN: [id number]  HPI  Hypertension : Controlled  Essential thrombocythemia: Most recent platelet count was 234,000. Problem has been followed by Dr. Dalene Carrow, but [facility] has [facility] the [facility], so the patient will be switching doctors soon.  Anemia, unspecified: Mild, hemoglobin 11.4  Loss of weight: Patient's weight appears to be stable over the last year. Trying to gain weight. Not eating well. No night sweats or fevers. Dr. Dalene Carrow told her she should be over 100#.  Memory loss: She has noted some mild memory problems  Other and unspecified hyperlipidemia: Not recently checked  Hoarse: Raspy voice started in Dec 2013 when she had a cold and coughed a lot. Cough is better, but voice raspiness has persisted.    Review of Systems DATA OBTAINED: from patient GENERAL: Feels well.   No fevers, fatigue, change in appetite or weight SKIN: No itch, rash or open wounds EYES: No eye pain. History of dry eyes. No change in vision EARS: No earache, tinnitus, change in hearing NOSE: No congestion, drainage or bleeding MOUTH/THROAT: No mouth or tooth pain  No sore throat No difficulty chewing or swallowing RESPIRATORY: No cough, wheezing, SOB CARDIAC: No chest pain, palpitations  No edema. CHEST/BREASTS: No discomfort, discharge or lumps in breasts GI: No abdominal pain  No N/V/D or constipation. Constipation improved on Magnesium tablets. No heartburn or reflux  GU: No dysuria, frequency or urgency  No change in urine volume or character No nocturia or change in stream   MUSCULOSKELETAL: No joint pain, swelling or stiffness  No back pain  No muscle ache, pain, weakness  Gait is steady  No recent falls.  NEUROLOGIC: No dizziness, fainting, headache, imbalance, numbness  No change in mental status.  PSYCHIATRIC: No feelings of anxiety, depression Sleeps well.  No behavior issue.      Objective:   Physical Exam BP 124/62  Pulse 60  Ht 5\' 1"  (1.549 m)  Wt 99 lb 8 oz (45.133 kg)  BMI 18.81 kg/m2 GENERAL APPEARANCE: No acute distress, appropriately groomed, normal body habitus. Alert, pleasant, conversant. HEAD: Normocephalic, atraumatic EYES: Conjunctiva/lids clear. Pupils round, reactive. EOMs intact. Wearing corrective lenses. EARS: External exam WNL, canals clear, TM WNL. Hearing grossly normal. NECK: Supple, full ROM. No thyroid tenderness, enlargement or nodule LYMPHATICS: No head, neck or supraclavicular adenopathy RESPIRATORY: Breathing is even, unlabored. Lung sounds are clear and full.  CARDIOVASCULAR: Heart RRR. Grade 2/6 systolic ejection murmur along left sternal border.  ARTERIAL: No carotid, aortic or femoral bruit. Carotid, Femoral, Popliteal, DP,PT pulse 2+.  VENOUS: No varicosities. No venous stasis skin changes  EDEMA: No peripheral or periorbital edema. No ascites GASTROINTESTINAL: Abdomen is soft, non-tender, not distended w/ normal bowel sounds. No hepatic or splenic enlargement. No mass, ventral or inguinal hernia. MUSCULOSKELETAL: Moves all extremities with full ROM, strength and tone. Back is without kyphosis, scoliosis or spinal process tenderness. Gait is steady. Hands have both Heberden's nodes and Bouchard's nodes. NEUROLOGIC: Oriented to time, place, person. Cranial nerves 2-12 grossly intact, speech clear, no tremor. Patella, brachial DTR 2+. PSYCHIATRIC: Mood and affect appropriate to situation   Lab Reports  Appointment on 03/13/2013  Component Date Value Range Status  . WBC 03/13/2013 3.8* 3.9 - 10.3 10e3/uL Final  . NEUT# 03/13/2013 2.6  1.5 - 6.5 10e3/uL Final  . HGB 03/13/2013 12.0  11.6 - 15.9 g/dL Final  . HCT 40/98/1191 35.3  34.8 - 46.6 % Final  . Platelets 03/13/2013 262  145 - 400 10e3/uL Final  . MCV 03/13/2013 128.9* 79.5 - 101.0 fL Final  . MCH 03/13/2013 43.7* 25.1 - 34.0 pg Final  . MCHC 03/13/2013 33.9  31.5 - 36.0 g/dL Final  .  RBC 56/21/3086 2.74* 3.70 - 5.45 10e6/uL Final  . RDW 03/13/2013 13.4  11.2 - 14.5 % Final  . lymph# 03/13/2013 0.8* 0.9 - 3.3 10e3/uL Final  . MONO# 03/13/2013 0.4  0.1 - 0.9 10e3/uL Final  . Eosinophils Absolute 03/13/2013 0.0  0.0 - 0.5 10e3/uL Final  . Basophils Absolute 03/13/2013 0.0  0.0 - 0.1 10e3/uL Final  . NEUT% 03/13/2013 68.0  38.4 - 76.8 % Final  . LYMPH% 03/13/2013 21.5  14.0 - 49.7 % Final  . MONO% 03/13/2013 9.2  0.0 - 14.0 % Final  . EOS% 03/13/2013 0.9  0.0 - 7.0 % Final  . BASO% 03/13/2013 0.4  0.0 - 2.0 % Final   04/25/2013 CBC: WBC 2600, hemoglobin 11.4, MCV 122.0, platelet count 234,000 (patient has known hematologic abnormalities and is on Hydrea for previous thrombocythemia)  CMP normal     Assessment & Plan:  Hypertension -controlled on current medications   Plan: CMP  Essential thrombocythemia - under control. Associated with neutropenia and a mild anemia.   Plan: CBC With differential/Platelet  Anemia, unspecified: Mild, chronic  Loss of weight: Weight actually seems to be fairly stable although the patient continues to worry about it.   Memory loss: Stable  Other and unspecified hyperlipidemia: Last checked 06/21/12. At that time total cholesterol 195, triglyceride 73, HDL 67, LDL 113. She is not currently on medications. We are just following this periodically.  Hoarse: Discussed this new problem with the patient in detail today. She has no pain or discomfort in the throat. We discussed possible etiologies which could be lax vocal cords, vocal cord polyp, singer's nodules, or cancer of the vocal cords. We offered to get her an appointment with ear nose and throat specialist, but she declines at this time.

## 2013-05-07 DIAGNOSIS — R490 Dysphonia: Secondary | ICD-10-CM | POA: Insufficient documentation

## 2013-05-10 ENCOUNTER — Other Ambulatory Visit: Payer: Self-pay | Admitting: Medical Oncology

## 2013-05-10 DIAGNOSIS — N94819 Vulvodynia, unspecified: Secondary | ICD-10-CM | POA: Diagnosis not present

## 2013-05-10 DIAGNOSIS — N3010 Interstitial cystitis (chronic) without hematuria: Secondary | ICD-10-CM | POA: Diagnosis not present

## 2013-05-10 DIAGNOSIS — D473 Essential (hemorrhagic) thrombocythemia: Secondary | ICD-10-CM

## 2013-05-11 ENCOUNTER — Other Ambulatory Visit (HOSPITAL_BASED_OUTPATIENT_CLINIC_OR_DEPARTMENT_OTHER): Payer: Medicare Other | Admitting: Lab

## 2013-05-11 DIAGNOSIS — D473 Essential (hemorrhagic) thrombocythemia: Secondary | ICD-10-CM | POA: Diagnosis not present

## 2013-05-11 LAB — CBC WITH DIFFERENTIAL/PLATELET
Basophils Absolute: 0 10*3/uL (ref 0.0–0.1)
EOS%: 1.4 % (ref 0.0–7.0)
Eosinophils Absolute: 0 10*3/uL (ref 0.0–0.5)
HCT: 32.8 % — ABNORMAL LOW (ref 34.8–46.6)
HGB: 11.1 g/dL — ABNORMAL LOW (ref 11.6–15.9)
MCH: 43.9 pg — ABNORMAL HIGH (ref 25.1–34.0)
NEUT#: 1.7 10*3/uL (ref 1.5–6.5)
NEUT%: 59.1 % (ref 38.4–76.8)
lymph#: 0.8 10*3/uL — ABNORMAL LOW (ref 0.9–3.3)

## 2013-05-16 ENCOUNTER — Other Ambulatory Visit: Payer: Self-pay | Admitting: Medical Oncology

## 2013-05-18 ENCOUNTER — Telehealth: Payer: Self-pay | Admitting: Hematology and Oncology

## 2013-05-18 NOTE — Telephone Encounter (Signed)
Per Trey Paula pt moved from CP 1 9/5 to CP 2 9/25 as she wishes to see Dr Bertis Ruddy when she arrives. Pt moved to CP 2 as this is the scheduling for former [facility] pt's which Dr. Bertis Ruddy is expected to take over. S/w pt re changes w/new d/t/provider for 9/25 @ 1:30pm lb/CP 2 Bertis Ruddy). Schedule mailed.

## 2013-05-19 ENCOUNTER — Other Ambulatory Visit: Payer: Self-pay | Admitting: *Deleted

## 2013-05-19 DIAGNOSIS — D473 Essential (hemorrhagic) thrombocythemia: Secondary | ICD-10-CM

## 2013-05-19 MED ORDER — HYDROXYUREA 500 MG PO CAPS: 1000.0000 mg | ORAL_CAPSULE | ORAL | Status: DC

## 2013-06-05 ENCOUNTER — Encounter: Payer: Self-pay | Admitting: Internal Medicine

## 2013-06-23 DIAGNOSIS — J060 Acute laryngopharyngitis: Secondary | ICD-10-CM | POA: Diagnosis not present

## 2013-06-29 ENCOUNTER — Encounter: Payer: Self-pay | Admitting: Nurse Practitioner

## 2013-06-29 ENCOUNTER — Ambulatory Visit (INDEPENDENT_AMBULATORY_CARE_PROVIDER_SITE_OTHER): Payer: Medicare Other | Admitting: Nurse Practitioner

## 2013-06-29 VITALS — BP 116/62 | HR 65 | Temp 98.5°F | Resp 16

## 2013-06-29 DIAGNOSIS — R05 Cough: Secondary | ICD-10-CM

## 2013-06-29 DIAGNOSIS — R040 Epistaxis: Secondary | ICD-10-CM

## 2013-06-29 DIAGNOSIS — J060 Acute laryngopharyngitis: Secondary | ICD-10-CM

## 2013-06-29 DIAGNOSIS — R197 Diarrhea, unspecified: Secondary | ICD-10-CM | POA: Diagnosis not present

## 2013-06-29 MED ORDER — GUAIFENESIN-CODEINE 100-10 MG/5ML PO SYRP: 5.0000 mL | ORAL_SOLUTION | Freq: Three times a day (TID) | ORAL | Status: DC | PRN

## 2013-06-29 NOTE — Patient Instructions (Addendum)
STOP antibiotic (augmentin) Take probiotic twice daily for 1 week and Eat a yogurt daily -- this is to help promote gut health  Will give refill for cough medication

## 2013-06-29 NOTE — Progress Notes (Signed)
Patient ID: [name] [name], female   DOB: 02-04-1923, 79 y.o.   MRN: [id number]   Allergies  Allergen Reactions  . Darvocet [Propoxyphene-Acetaminophen] Nausea And Vomiting  . Darvon Nausea And Vomiting  . Meperidine And Related Nausea And Vomiting  . Vicodin [Hydrocodone-Acetaminophen] Nausea And Vomiting    Chief Complaint  Patient presents with  . Acute Visit    laryngopharyngitis, cough, nose bleed    HPI: Patient is a 79 y.o. female seen in the office today for follow up from urgent care.  Went for loss of voice and cough- denies sore throat was dx with laryngitis and prescribed augmentin however since she has been on this she has had increase diarrhea and loss of bowels with episodes on incontinence. She denies fevers, chills, congestion, sore throat, shortness of breath or chest pains  Had bloody nose on arrival to office. Pt reports she does not normally have these (maybe twice a year) and it stopped after a few mins. Reports some dizziness due to episode.   Review of Systems:  Review of Systems  Constitutional: Negative for fever, chills, weight loss and malaise/fatigue.  HENT: Positive for nosebleeds (today but not reoccurring ). Negative for congestion, sore throat and tinnitus.   Eyes: Negative.   Respiratory: Negative for cough and shortness of breath.   Cardiovascular: Negative for chest pain and palpitations.  Musculoskeletal: Negative.   Skin: Negative.   Neurological: Negative for dizziness, tremors, weakness and headaches.  Endo/Heme/Allergies: Does not bruise/bleed easily.     Past Medical History  Diagnosis Date  . Glaucoma   . Hypertension   . Arthritis   . Neck fracture   . Abdominal pain, unspecified site 08/11/2012  . Essential thrombocythemia 06/18/2011  . Anemia, unspecified 06/18/2011  . 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 Loss of weight 06/18/2011  . Lumbago 12/18/2010  . Sciatica 06/19/2010  . Memory loss 12/19/2009  . Diverticulosis of colon  (without mention of hemorrhage) 04/20/2007  . Disorder of bone and cartilage, unspecified 04/20/2007  . Hemorrhage of gastrointestinal tract, unspecified 03/09/2007  . Other and unspecified hyperlipidemia 09/01/2006  . BBB (bundle branch block) 03/03/2006  . Cardiac dysrhythmia, unspecified 03/03/2006  . Chronic interstitial cystitis 03/03/2006  . Osteoarthrosis, unspecified whether generalized or localized, unspecified site 03/03/2006  . Hematuria, unspecified 04/22/2004  . Herpes zoster without mention of complication 03/03/2004  . 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 Appendectomy    . Tonsillectomy    . Abdominal hysterectomy    . Cataract extraction w/ intraocular lens  implant, bilateral    . Breast cyst excision Bilateral     benign   Social History:   reports that she has never smoked. She has never used smokeless tobacco. She reports that she does not drink alcohol or use illicit drugs.  Family History  Problem Relation Age of Onset  . Heart disease Father     MI    Medications: Patient's Medications  New Prescriptions   No medications on file  Previous Medications   ATENOLOL (TENORMIN) 50 MG TABLET    Take 50 mg by mouth. Take 1/2 tablet twice daily for blood pressure   CALCITONIN, SALMON, (MIACALCIN/FORTICAL) 200 UNIT/ACT NASAL SPRAY    Place 1 spray into the nose daily. 1 spray in alternate nostril daily.   CALCIUM-VITAMIN D-VITAMIN K (VIACTIV) 500-500-40 MG-UNT-MCG CHEW    Chew 1 each by mouth daily.   FLURBIPROFEN (ANSAID) 100 MG TABLET  100 mg daily.    GLUCOSAMINE-CHONDROIT-VIT C-MN (GLUCOSAMINE CHONDR 1500 COMPLX) CAPS    Take 2 capsules by mouth daily.    HYDROXYUREA (HYDREA) 500 MG CAPSULE    Take 2 capsules (1,000 mg total) by mouth every other day. Alternating with 500 mg or as directed.  Take with food/   LATANOPROST (XALATAN) 0.005 % OPHTHALMIC SOLUTION    Place 1 drop into both eyes daily.   MAGNESIUM 250 MG  TABS    Take by mouth. Take one tablet daily to help with constipation   URELLE (URELLE/URISED) 81 MG TABS    Take 1 tablet by mouth. Take one twice daily   VITAMIN E 400 UNIT CAPSULE    Take 400 Units by mouth every other day.  Modified Medications   No medications on file  Discontinued Medications   No medications on file     Physical Exam:  Filed Vitals:   06/29/13 1031  BP: 116/62  Pulse: 65  Temp: 98.5 F (36.9 C)  TempSrc: Oral  Resp: 16  SpO2: 98%    Physical Exam  Constitutional: She is oriented to person, place, and time and well-developed, well-nourished, and in no distress. No distress.  HENT:  Head: Normocephalic and atraumatic.  Right Ear: Ear canal normal.  Left Ear: Ear canal normal.  Nose: Nose normal.  Mouth/Throat: Oropharynx is clear and moist and mucous membranes are normal. No oropharyngeal exudate.  Eyes: Conjunctivae and EOM are normal. Pupils are equal, round, and reactive to light.  Neck: Normal range of motion. Neck supple.  Cardiovascular: Normal rate, regular rhythm and normal heart sounds.   Pulmonary/Chest: Effort normal and breath sounds normal. No respiratory distress.  Abdominal: Soft. Bowel sounds are normal. She exhibits no distension.  Neurological: She is alert and oriented to person, place, and time.  Skin: Skin is warm and dry. She is not diaphoretic.  Psychiatric: Affect normal.    CBC:  Recent Labs  01/11/13 0759 03/13/13 0755 05/11/13 0810  WBC 4.3 3.8* 2.9*  NEUTROABS 2.8 2.6 1.7  HGB 12.5 12.0 11.1*  HCT 38.3 35.3 32.8*  MCV 131.2* 128.9* 129.6*  PLT 262 262 239    Assessment/Plan 1. Laryngopharyngitis Pt currently without symptoms of sore throat or congestion, pt was placed on antibiotics which are now causing her severe diarrhea. Will have pt stop taking antibiotic at this time due to question if she even needed one.  2. Cough Overall improved but still there will refill medication  - guaiFENesin-codeine  (ROBITUSSIN AC) 100-10 MG/5ML syrup; Take 5 mLs by mouth 3 (three) times daily as needed for cough.  Dispense: 120 mL; Refill: 0  3. Epistaxis One episode at office today will follow up cbc due to pts history of thrombocythemia - CBC With differential/Platelet  4. Diarrhea Will stop antibiotic which is most likely cause To take florastor twice daily for 1 week and add yogurt daily  - Basic metabolic panel To call and rtc if this has not resolved in 2-3 days

## 2013-06-30 ENCOUNTER — Encounter: Payer: Self-pay | Admitting: Geriatric Medicine

## 2013-06-30 LAB — CBC WITH DIFFERENTIAL
Basos: 0 % (ref 0–3)
Eos: 1 % (ref 0–5)
Eosinophils Absolute: 0.1 10*3/uL (ref 0.0–0.4)
HCT: 29.4 % — ABNORMAL LOW (ref 34.0–46.6)
Hemoglobin: 10.2 g/dL — ABNORMAL LOW (ref 11.1–15.9)
Lymphocytes Absolute: 0.6 10*3/uL — ABNORMAL LOW (ref 0.7–3.1)
MCH: 43 pg — ABNORMAL HIGH (ref 26.6–33.0)
MCHC: 34.7 g/dL (ref 31.5–35.7)
MCV: 124 fL — ABNORMAL HIGH (ref 79–97)
Monocytes Absolute: 0.6 10*3/uL (ref 0.1–0.9)
Neutrophils Absolute: 3.7 10*3/uL (ref 1.4–7.0)
Neutrophils Relative %: 77 % — ABNORMAL HIGH (ref 40–74)
RBC: 2.37 x10E6/uL — CL (ref 3.77–5.28)

## 2013-06-30 LAB — BASIC METABOLIC PANEL
BUN/Creatinine Ratio: 16 (ref 11–26)
BUN: 10 mg/dL (ref 8–27)
CO2: 26 mmol/L (ref 18–29)
Creatinine, Ser: 0.61 mg/dL (ref 0.57–1.00)
GFR calc non Af Amer: 81 mL/min/{1.73_m2} (ref >59)
Sodium: 139 mmol/L (ref 134–144)

## 2013-07-04 ENCOUNTER — Other Ambulatory Visit: Payer: Self-pay | Admitting: Nurse Practitioner

## 2013-07-07 ENCOUNTER — Telehealth: Payer: Self-pay | Admitting: Hematology and Oncology

## 2013-07-07 ENCOUNTER — Telehealth: Payer: Self-pay

## 2013-07-07 NOTE — Telephone Encounter (Signed)
Verified with patient that she is taking Hydrea 500 mg alt. with 1000 mg every other day.

## 2013-07-07 NOTE — Telephone Encounter (Signed)
Per desk nurse moved 9/25 appt to 9/10 due to pt needs sooner appt. Because pt needs sooner appt she will not be able to see Dr. Bertis Ruddy. Pt aware she will be seeing Dr. Rosie Fate. Pt has new appt d/t for lb/CP 9/10 @ 2:30pm. Pt aware 9/25 appt cx'd and Dr. Rosie Fate will decide when she should return for next f/u.

## 2013-07-07 NOTE — Telephone Encounter (Signed)
Ms. Trippett called wanting to see MD sooner than 08-03-13.  She has been feeling weak and strung out the last few weeks. Pt.'s Hgb. Was down to 10.2on 06-29-13 from 11.1 on 05-11-13 with visits with Candelaria Celeste NP with Dr. Frederik Pear. She denies any sob or further nose bleeds since 06-29-13 visit with Ms. Purnell Shoemaker.   Dory Peru in scheduling will call patient with an appointment with Provider with in the next 14 days.  Patient appreciated call.

## 2013-07-07 NOTE — Telephone Encounter (Signed)
Former LO pt calling requesting a sooner appt due to hemoglobin dropping. Message to desk nurse Sallye Ober) who is working on this. Pt has called a second time today and was made aware that nurse is working on her request and will call her back.

## 2013-07-14 ENCOUNTER — Ambulatory Visit: Payer: Medicare Other

## 2013-07-14 ENCOUNTER — Other Ambulatory Visit: Payer: Medicare Other | Admitting: Lab

## 2013-07-19 ENCOUNTER — Other Ambulatory Visit (HOSPITAL_BASED_OUTPATIENT_CLINIC_OR_DEPARTMENT_OTHER): Payer: Medicare Other | Admitting: Lab

## 2013-07-19 ENCOUNTER — Telehealth: Payer: Self-pay | Admitting: Internal Medicine

## 2013-07-19 ENCOUNTER — Ambulatory Visit (HOSPITAL_BASED_OUTPATIENT_CLINIC_OR_DEPARTMENT_OTHER): Payer: Medicare Other | Admitting: Internal Medicine

## 2013-07-19 VITALS — BP 118/54 | HR 65 | Temp 98.8°F | Resp 17 | Ht 61.0 in | Wt 95.1 lb

## 2013-07-19 DIAGNOSIS — D75839 Thrombocytosis, unspecified: Secondary | ICD-10-CM

## 2013-07-19 DIAGNOSIS — D473 Essential (hemorrhagic) thrombocythemia: Secondary | ICD-10-CM | POA: Diagnosis not present

## 2013-07-19 LAB — COMPREHENSIVE METABOLIC PANEL (CC13)
ALT: 12 U/L (ref 0–55)
AST: 18 U/L (ref 5–34)
Albumin: 3.3 g/dL — ABNORMAL LOW (ref 3.5–5.0)
Alkaline Phosphatase: 63 U/L (ref 40–150)
BUN: 15.1 mg/dL (ref 7.0–26.0)
CO2: 25 meq/L (ref 22–29)
Calcium: 9 mg/dL (ref 8.4–10.4)
Chloride: 105 meq/L (ref 98–109)
Creatinine: 0.9 mg/dL (ref 0.6–1.1)
Glucose: 155 mg/dL — ABNORMAL HIGH (ref 70–140)
Potassium: 4 meq/L (ref 3.5–5.1)
Sodium: 141 meq/L (ref 136–145)
Total Bilirubin: 0.42 mg/dL (ref 0.20–1.20)
Total Protein: 6.6 g/dL (ref 6.4–8.3)

## 2013-07-19 LAB — CBC WITH DIFFERENTIAL/PLATELET
BASO%: 0.4 % (ref 0.0–2.0)
Basophils Absolute: 0 10e3/uL (ref 0.0–0.1)
EOS%: 0.3 % (ref 0.0–7.0)
Eosinophils Absolute: 0 10e3/uL (ref 0.0–0.5)
HCT: 34.4 % — ABNORMAL LOW (ref 34.8–46.6)
HGB: 11.7 g/dL (ref 11.6–15.9)
LYMPH%: 18.9 % (ref 14.0–49.7)
MCH: 44.3 pg — ABNORMAL HIGH (ref 25.1–34.0)
MCHC: 34.1 g/dL (ref 31.5–36.0)
MCV: 129.8 fL — ABNORMAL HIGH (ref 79.5–101.0)
MONO#: 0.4 10e3/uL (ref 0.1–0.9)
MONO%: 8.4 % (ref 0.0–14.0)
NEUT#: 3.2 10e3/uL (ref 1.5–6.5)
NEUT%: 72 % (ref 38.4–76.8)
Platelets: 542 10e3/uL — ABNORMAL HIGH (ref 145–400)
RBC: 2.65 10e6/uL — ABNORMAL LOW (ref 3.70–5.45)
RDW: 12.7 % (ref 11.2–14.5)
WBC: 4.5 10e3/uL (ref 3.9–10.3)
lymph#: 0.9 10e3/uL (ref 0.9–3.3)

## 2013-07-19 NOTE — Patient Instructions (Signed)
Fall Prevention, Elderly Falls are the leading cause of injuries, accidents, and accidental deaths in people over the age of 65. Falling is a real threat to your ability to live on your own. CAUSES    Poor eyesight or poor hearing can make you more likely to fall.   Illnesses and physical conditions can affect your strength and balance.   Poor lighting, throw rugs and pets in your home can make you more likely to trip or slip.   The side effects of some medicines can upset your balance and lead to falling. These include medicines for depression, sleep problems, high blood pressure, diabetes, and heart conditions.  PREVENTION   Be sure your home is as safe as possible. Here are some tips:  Wear shoes with non-skid soles (not house slippers).   Be sure your home and outside area are well lit.   Use night lights throughout your house, including hallways and stairways.   Remove clutter and clean up spills on floors and walkways.   Remove throw rugs or fasten them to the floor with carpet tape. Tack down carpet edges.   Do not place electrical cords across pathways.   Install grab bars in your bathtub, shower, and toilet area. Towel bars should not be used as a grab bar.   Install handrails on both sides of stairways.   Do not climb on stools or stepladders. Get someone else to help with jobs that require climbing.   Do not wax your floors at all, or use a non-skid wax.   Repair uneven or unsafe sidewalks, walkways or stairs.   Keep frequently used items within reach.   Be aware of pets so you do not trip.  Get regular check-ups from your doctor, and take good care of yourself:  Have your eyes checked every year for vision changes, cataracts, glaucoma, and other eye problems. Wear eyeglasses as directed.   Have your hearing checked every 2 years, or anytime you or others think that you cannot hear well. Use hearing aids as directed.   See your caregiver if you have foot pain  or corns. Sore feet can contribute to falls.   Let your caregiver know if a medicine is making you feel dizzy or making you lose your balance.   Use a cane, walker, or wheelchair as directed. Use walker or wheelchair brakes when getting in and out.   When you get up from bed, sit on the side of the bed for 1 to 2 minutes before you stand up. This will give your blood pressure time to adjust, and you will feel less dizzy.   If you need to go to the bathroom often, consider using a bedside commode.  Keep your body in good shape:  Get regular exercise, especially walking.   Do exercises to strengthen the muscles you use for walking and lifting.   Do not smoke.   Minimize use of alcohol.  SEEK IMMEDIATE MEDICAL CARE IF:    You feel dizzy, weak, or unsteady on your feet.   You feel confused.   You fall.  Document Released: 10/26/2005 Document Revised: 10/15/2011 Document Reviewed: 04/22/2007 Exit[employer] Patient Information 2012 [employer]. 

## 2013-07-19 NOTE — Telephone Encounter (Signed)
Gave pt appt for labs  And Md visit on December 2014

## 2013-07-23 NOTE — Progress Notes (Signed)
Hematology and Oncology Follow Up Visit  [place] Fratto [id number] 10/12/1923 79 y.o. 07/19/2013 8:00 pm GREEN, Lenon Curt, MD  Principle Diagnosis: Essential thrombocytosis,intitiated on Hydrea on 07/05/2011 ; Ree Kida 2 mutation analysis performed on 06/26/2011 was negative. Quantitative RT-PCR for BCR-ABL 1 performed on 07/07/2011 was not detected. Initially the patient was on Hydrea 1500 mg Q. OD and 1000 mg QOD, with significant drop in platelet count to 60,000and neutropenia, adjusted to current dose of  Hydrea 500 mg ii po qod and i po qod, with adequate values.  Prior Therapy:  None  Current therapy: None  Interim History:  Ms. Trapani returns to the office of the cancer Center for a followup visit. She is overall in good health.she remains active. She denies any headaches, nausea, vomiting, shortness of breath, or cardiac complaints. No GI or GU complaints . Denies any bleeding issues, or mucositis. Her appetite is adequate.  She is accompanied by her grandson Will [place].  She states one month ago she was diagnosed with acute laryngophargitis but it has now improved.   Medications: I have reviewed the patient's current medications.  Current Outpatient Prescriptions  Medication Sig Dispense Refill  . atenolol (TENORMIN) 50 MG tablet Take 50 mg by mouth. Take 1/2 tablet twice daily for blood pressure      . calcitonin, salmon, (MIACALCIN/FORTICAL) 200 UNIT/ACT nasal spray Place 1 spray into the nose daily. 1 spray in alternate nostril daily.      . Calcium-Vitamin D-Vitamin K (VIACTIV) 500-500-40 MG-UNT-MCG CHEW Chew 1 each by mouth daily.      . flurbiprofen (ANSAID) 100 MG tablet 100 mg daily.       . Glucosamine-Chondroit-Vit C-Mn (GLUCOSAMINE CHONDR 1500 COMPLX) CAPS Take 2 capsules by mouth daily.       . hydroxyurea (HYDREA) 500 MG capsule Take 2 capsules (1,000 mg total) by mouth every other day. Alternating with 500 mg or as directed.  Take with food/  100 capsule  1  . latanoprost  (XALATAN) 0.005 % ophthalmic solution Place 1 drop into both eyes daily.      . Magnesium 250 MG TABS Take by mouth. Take one tablet daily to help with constipation      . URELLE (URELLE/URISED) 81 MG TABS Take 1 tablet by mouth. Take one twice daily      . vitamin E 400 UNIT capsule Take 400 Units by mouth every other day.       No current facility-administered medications for this visit.     Allergies:  Allergies  Allergen Reactions  . Darvocet [Propoxyphene-Acetaminophen] Nausea And Vomiting  . Darvon Nausea And Vomiting  . Meperidine And Related Nausea And Vomiting  . Vicodin [Hydrocodone-Acetaminophen] Nausea And Vomiting    Past Medical History, Surgical history, Social history, and Family History were reviewed and updated.  Review of Systems: Constitutional:  Negative for fever, chills, night sweats, anorexia, weight loss, pain. Cardiovascular: no chest pain or dyspnea on exertion Respiratory: no cough, shortness of breath, or wheezing Neurological: no TIA or stroke symptoms Dermatological: negative for rash ENT: negative for - epistaxis or headaches Skin: Negative. Gastrointestinal: no abdominal pain, change in bowel habits, or black or bloody stools Genito-Urinary: no dysuria, trouble voiding, or hematuria Hematological and Lymphatic: negative for - bleeding problems or blood clots Breast: negative for breast lumps Musculoskeletal: negative for - gait disturbance Remaining ROS negative. Physical Exam: Blood pressure 118/54, pulse 65, temperature 98.8 F (37.1 C), temperature source Oral, resp. rate 17, height 5\' 1"  (1.549  m), weight 95 lb 1.6 oz (43.137 kg), SpO2 99.00%. ECOG: 0 General appearance: alert, cooperative, appears stated age and no distress Head: Normocephalic, without obvious abnormality, atraumatic Neck: no adenopathy, supple, symmetrical, trachea midline and thyroid not enlarged, symmetric, no tenderness/mass/nodules HEENT: OP clear; PERRL; EOMi.   Lymph nodes: Cervical, supraclavicular, and axillary nodes normal. Heart:regular rate and rhythm, S1, S2 normal, no murmur, click, rub or gallop Lung:chest clear, no wheezing, rales, normal symmetric air entry Abdomin: soft, non-tender, without masses or organomegaly EXT:No peripheral edema   Lab Results: Lab Results  Component Value Date   WBC 4.5 07/19/2013   HGB 11.7 07/19/2013   HCT 34.4* 07/19/2013   MCV 129.8* 07/19/2013   PLT 542* 07/19/2013     Chemistry      Component Value Date/Time   NA 141 07/19/2013 1417   NA 139 06/29/2013 1119   NA 140 06/24/2012 0902   K 4.0 07/19/2013 1417   K 4.1 06/29/2013 1119   CL 101 06/29/2013 1119   CO2 25 07/19/2013 1417   CO2 26 06/29/2013 1119   BUN 15.1 07/19/2013 1417   BUN 10 06/29/2013 1119   BUN 19 06/24/2012 0902   CREATININE 0.9 07/19/2013 1417   CREATININE 0.61 06/29/2013 1119      Component Value Date/Time   CALCIUM 9.0 07/19/2013 1417   CALCIUM 8.6 06/29/2013 1119   ALKPHOS 63 07/19/2013 1417   ALKPHOS 54 06/26/2011 1448   AST 18 07/19/2013 1417   AST 22 06/26/2011 1448   ALT 12 07/19/2013 1417   ALT 12 06/26/2011 1448   BILITOT 0.42 07/19/2013 1417   BILITOT 0.4 06/26/2011 1448       Radiological Studies: No results found.   Impression and Plan: 1. Thrombocytosis.  -- [name] is an 79 year old woman with essential thrombocytosis, currently on Hydrea, tolerating this current dosage well.  -- We reviewed her labs, her plts of 542 are slightly up from prior levels.  We will repeat her CBC in one month and if increased will slowly titrate her hydrea up.   -- She will return in 6 months for a followup visit with at which time CBC with differential and CMET will be drawn.  Spent more than half the time coordinating care.    Fermin Yan, MD 07/19/2013 8:00 pm

## 2013-07-25 ENCOUNTER — Telehealth: Payer: Self-pay | Admitting: Dietician

## 2013-08-03 ENCOUNTER — Other Ambulatory Visit: Payer: Medicare Other | Admitting: Lab

## 2013-08-03 ENCOUNTER — Ambulatory Visit: Payer: Medicare Other

## 2013-08-08 DIAGNOSIS — H43819 Vitreous degeneration, unspecified eye: Secondary | ICD-10-CM | POA: Diagnosis not present

## 2013-08-08 DIAGNOSIS — H04129 Dry eye syndrome of unspecified lacrimal gland: Secondary | ICD-10-CM | POA: Diagnosis not present

## 2013-08-08 DIAGNOSIS — H01029 Squamous blepharitis unspecified eye, unspecified eyelid: Secondary | ICD-10-CM | POA: Diagnosis not present

## 2013-08-08 DIAGNOSIS — H18519 Endothelial corneal dystrophy, unspecified eye: Secondary | ICD-10-CM | POA: Diagnosis not present

## 2013-08-08 DIAGNOSIS — Z961 Presence of intraocular lens: Secondary | ICD-10-CM | POA: Diagnosis not present

## 2013-08-08 DIAGNOSIS — H4011X0 Primary open-angle glaucoma, stage unspecified: Secondary | ICD-10-CM | POA: Diagnosis not present

## 2013-08-12 DIAGNOSIS — Z23 Encounter for immunization: Secondary | ICD-10-CM | POA: Diagnosis not present

## 2013-08-18 ENCOUNTER — Other Ambulatory Visit (HOSPITAL_BASED_OUTPATIENT_CLINIC_OR_DEPARTMENT_OTHER): Payer: Medicare Other

## 2013-08-18 ENCOUNTER — Encounter (INDEPENDENT_AMBULATORY_CARE_PROVIDER_SITE_OTHER): Payer: Self-pay

## 2013-08-18 DIAGNOSIS — D473 Essential (hemorrhagic) thrombocythemia: Secondary | ICD-10-CM

## 2013-08-18 LAB — CBC WITH DIFFERENTIAL/PLATELET
Eosinophils Absolute: 0.1 10*3/uL (ref 0.0–0.5)
LYMPH%: 19.2 % (ref 14.0–49.7)
MCV: 127.2 fL — ABNORMAL HIGH (ref 79.5–101.0)
MONO%: 9.2 % (ref 0.0–14.0)
NEUT#: 3.5 10*3/uL (ref 1.5–6.5)
Platelets: 390 10*3/uL (ref 145–400)
RBC: 2.9 10*6/uL — ABNORMAL LOW (ref 3.70–5.45)

## 2013-08-21 ENCOUNTER — Other Ambulatory Visit: Payer: Self-pay | Admitting: Internal Medicine

## 2013-08-29 DIAGNOSIS — D649 Anemia, unspecified: Secondary | ICD-10-CM | POA: Diagnosis not present

## 2013-08-29 DIAGNOSIS — I10 Essential (primary) hypertension: Secondary | ICD-10-CM | POA: Diagnosis not present

## 2013-09-07 ENCOUNTER — Non-Acute Institutional Stay: Payer: Medicare Other | Admitting: Internal Medicine

## 2013-09-07 ENCOUNTER — Encounter: Payer: Self-pay | Admitting: Internal Medicine

## 2013-09-07 VITALS — BP 104/66 | HR 64 | Ht 61.0 in | Wt 98.0 lb

## 2013-09-07 DIAGNOSIS — E785 Hyperlipidemia, unspecified: Secondary | ICD-10-CM

## 2013-09-07 DIAGNOSIS — D473 Essential (hemorrhagic) thrombocythemia: Secondary | ICD-10-CM | POA: Diagnosis not present

## 2013-09-07 DIAGNOSIS — R634 Abnormal weight loss: Secondary | ICD-10-CM

## 2013-09-07 DIAGNOSIS — D649 Anemia, unspecified: Secondary | ICD-10-CM

## 2013-09-07 DIAGNOSIS — I10 Essential (primary) hypertension: Secondary | ICD-10-CM

## 2013-09-07 NOTE — Patient Instructions (Signed)
Continuecurrent medicatioins 

## 2013-09-07 NOTE — Progress Notes (Signed)
Subjective:    Patient ID: [name] [name], female    DOB: 04-02-1923, 79 y.o.   MRN: [id number]  Chief Complaint  Patient presents with  . Medical Managment of Chronic Issues    blood pressure, anemia, weight loss, memory    HPI Hypertension: controlled  Essential thrombocythemia: controlled  Anemia, unspecified: stable  Loss of weight: regained 3#  Other and unspecified hyperlipidemia: controlled    Current Outpatient Prescriptions on File Prior to Visit  Medication Sig Dispense Refill  . atenolol (TENORMIN) 50 MG tablet Take by mouth. Take 1/2 tablet twice daily for blood pressure      . calcitonin, salmon, (MIACALCIN/FORTICAL) 200 UNIT/ACT nasal spray USE 1 SQUIRT IN ALTERNATING NOSTRILS DAILY FOR BONES  3.7 mL  3  . Calcium-Vitamin D-Vitamin K (VIACTIV) 500-500-40 MG-UNT-MCG CHEW Chew 1 each by mouth daily.      . flurbiprofen (ANSAID) 100 MG tablet 100 mg daily.       . Glucosamine-Chondroit-Vit C-Mn (GLUCOSAMINE CHONDR 1500 COMPLX) CAPS Take 2 capsules by mouth daily.       . hydroxyurea (HYDREA) 500 MG capsule Take 2 capsules (1,000 mg total) by mouth every other day. Alternating with 500 mg or as directed.  Take with food/  100 capsule  1  . latanoprost (XALATAN) 0.005 % ophthalmic solution Place 1 drop into both eyes daily.      . Magnesium 250 MG TABS Take by mouth. Take one tablet daily to help with constipation      . URELLE (URELLE/URISED) 81 MG TABS Take 1 tablet by mouth. Take one every other day      . vitamin E 400 UNIT capsule Take 400 Units by mouth every other day.       No current facility-administered medications on file prior to visit.    Review of Systems  Constitutional: Negative for fever, diaphoresis, activity change, appetite change, fatigue and unexpected weight change.  Eyes: Negative.   Respiratory: Negative.   Cardiovascular: Negative.   Gastrointestinal: Negative.   Endocrine: Negative.   Genitourinary: Negative.   Musculoskeletal:  Negative.   Neurological: Negative.   Hematological:       Thrombocythemia  Psychiatric/Behavioral: Negative.        Objective:BP 104/66  Pulse 64  Ht 5\' 1"  (1.549 m)  Wt 98 lb (44.453 kg)  BMI 18.53 kg/m2    Physical Exam  Constitutional: She is oriented to person, place, and time. She appears well-developed and well-nourished. No distress.  HENT:  Head: Normocephalic and atraumatic.  Right Ear: External ear normal.  Left Ear: External ear normal.  Nose: Nose normal.  Mouth/Throat: Oropharynx is clear and moist.  Eyes: Conjunctivae and EOM are normal. Pupils are equal, round, and reactive to light.  Corrective lenses  Neck: No tracheal deviation present. No thyromegaly present.  Cardiovascular: Normal rate and regular rhythm.  Exam reveals no gallop and no friction rub.   Murmur (2/6/LSB SEM) heard. Pulmonary/Chest: No respiratory distress. She has no wheezes. She has no rales.  Abdominal: She exhibits no mass. There is no tenderness.  Musculoskeletal:  Heberden's nodes. Bouchard's nodes.  Neurological: She is alert and oriented to person, place, and time. She has normal reflexes. No cranial nerve deficit. Coordination normal.  Skin: No rash noted. No erythema. No pallor.  Psychiatric: She has a normal mood and affect. Her behavior is normal. Judgment and thought content normal.      LAB REVIEW 08/29/13 CBC; WBC 3,000, HGB 11.9, MCV 119.9, PLT 424,000  CMP: normal    Assessment & Plan:  Hypertension: controlled  Essential thrombocythemia;controlled  Anemia, unspecified: stable  Loss of weight: improving  Other and unspecified hyperlipidemia: recheck next visit

## 2013-09-12 ENCOUNTER — Encounter: Payer: Self-pay | Admitting: Internal Medicine

## 2013-09-26 ENCOUNTER — Encounter: Payer: Self-pay | Admitting: Cardiology

## 2013-09-26 DIAGNOSIS — I447 Left bundle-branch block, unspecified: Secondary | ICD-10-CM | POA: Diagnosis not present

## 2013-09-26 NOTE — Progress Notes (Signed)
Patient ID: [name] [name], female   DOB: 1923/09/20, 79 y.o.   MRN: [id number]  [name], [name]  Date of visit:  09/26/2013 DOB:  [birth year]    Age:  79 yrs. Medical record number:  [id number]     Account number:  [id number] Primary Care Provider: GREEN III, ARTHUR G ____________________________ CURRENT DIAGNOSES  1. Aortic Valve Disorder  2. Essential Thrombocythemia  3. Cardiomyopathy Idiopathic  4. LBBB  5. Arrhythmia-PVC's(symptomatic) ____________________________ ALLERGIES  Ace Inhibitors, Intolerance-cough  Darvon, Intolerance-unknown  Demerol, Intolerance-unknown  Disopyramide, Intolerance-unknown  Hydrocodone, Intolerance-unknown  Mexiletine, Intolerance-unknown  Procainamide, Intolerance-unknown  Tylenol, Intolerance-unknown ____________________________ MEDICATIONS  1. Glucosamine 500 mg tablet, TID  2. vitamin E 400 unit capsule, qod  3. Urelle 81-0.12 mg Tablet, 1 p.o. daily  4. calcitonin (salmon) 200 unit/actuation Spray, Non-Aerosol, 1 spray qd  5. hydroxyurea 500 mg capsule, Take as directed  6. flurbiprofen 100 mg tablet, 1 p.o. daily  7. Viactiv 500-500-40 mg-unit-mcg tablet,chewable, 1 p.o. daily  8. latanoprost 0.005 % drops, 1 gtt ou qd  9. atenolol 50 mg tablet, 1/2 tab b.i.d.  10. magnesium 250 mg tablet, 1 p.o. daily ____________________________ CHIEF COMPLAINTS  Followup of Aortic Valve Disorder  Followup of Arrhythmia-PVC's(symptomatic)  Followup of LBBB ____________________________ HISTORY OF PRESENT ILLNESS Patient seen for cardiac followup. She has doing well since she was previously here. She denies angina and has no PND, orthopnea or claudication. She has a history of a cardiomyopathy that has resolved. She remains underweight. Her major complaint is some persistent hoarseness that is persistent after an upper respiratory infection. She does have known aortic valve disease. ____________________________ PAST HISTORY  Past Medical Illnesses:   hypertension, hyperlipidemia, history of Coxsackie virus infection, history of interstitial cystitis, history of migraine headaches, diverticulosis, essential thrombocyosis;  Cardiovascular Illnesses:  cardiomyopathy, valvular heart disease;  Surgical Procedures:  bladder surgery, breast lumpectomy, cataract extraction OU;  Cardiology Procedures-Invasive:  no history of prior cardiac procedures;  Cardiology Procedures-Noninvasive:  echocardiogram January 2011;  LVEF of 60% documented via echocardiogram on 11/27/2009,   ____________________________ CARDIO-PULMONARY TEST DATES EKG Date:  09/26/2013;  Holter/Event Monitor Date: 07/30/2004;  Echocardiography Date: 11/27/2009;   ____________________________ FAMILY HISTORY Brother -- Brother dead Brother -- Brother dead, Myocardial infarction Father -- Myocardial infarction, Father dead Mother -- Mother dead Sister -- Sister dead Sister -- Sister dead ____________________________ SOCIAL HISTORY Alcohol Use:  no alcohol use;  Smoking:  never smoked;  Diet:  regular diet;  Lifestyle:  widowed and 2 sons;  Exercise:  some exercise;  Occupation:  retired Teacher, early years/pre;  Residence:  assisted living resident [facility];   ____________________________ REVIEW OF SYSTEMS General:  denies recent weight change, fatique or change in exercise tolerance. Eyes: cataract extraction bilaterally, wears eye glasses/contact lenses Respiratory: denies dyspnea, cough, wheezing or hemoptysis. Cardiovascular:  please review HPI Abdominal: occasional constipationGenitourinary-Female: no dysuria, urgency, frequency, UTIs, or stress incontinence Musculoskeletal:  arthritis of the hands, arthritis of the neck, nocturnal cramps Hematological/Immunologic:  see HPI ____________________________ PHYSICAL EXAMINATION VITAL SIGNS  Blood Pressure:  130/60 Sitting, Left arm, regular cuff  , 134/60 Standing, Left arm and regular cuff   Pulse:  64/min. Weight:  97.50 lbs. Height:  62"BMI:  18  Constitutional:  pleasant white female, in no acute distress Head:  normocephalic, normal hair pattern, no masses or tenderness ENT:  ears, nose and throat reveal no gross abnormalities.  Dentition good. Neck:  supple, no masses, thyromegaly, JVD. Carotid pulses are full and equal bilaterally without bruits. Chest:  normal symmetry, clear to auscultation and percussion. Cardiac:  regular rhythm, normal S1 and S2, no S3 or S4, grade 1/6 systolic murmur Peripheral Pulses:  the femoral,dorsalis pedis, and posterior tibial pulses are full and equal bilaterally with no bruits auscultated. Extremities & Back:  Heberden's nodes of hands Neurological:  no gross motor or sensory deficits noted, affect appropriate, oriented x3. ____________________________ MOST RECENT LIPID PANEL 06/21/12  CHOL TOTL 195 mg/dl, LDL 096 calc, HDL 67 mg/dl, TRIGLYCER 73 mg/dl and CHOL/HDL 2.9 (Calc) ____________________________ IMPRESSIONS/PLAN 1. Aortic valve disease with mild aortic regurgitation 2. History of cardio myopathy that is resolved 3. Chronic left bundle branch block 4. PVCs that have been symptomatic in the past  Recommendations:  Continue hydroxyurea for thrombocytosis. She is clinically doing well and recommended followup in one year. EKG shows her to be in sinus rhythm without PVCs today. ____________________________ TODAYS ORDERS  1. 12 Lead EKG: Today  2. Return Visit: 1 year                       ____________________________ Cardiology Physician:  Darden Palmer MD [facility]

## 2013-10-19 ENCOUNTER — Other Ambulatory Visit (HOSPITAL_BASED_OUTPATIENT_CLINIC_OR_DEPARTMENT_OTHER): Payer: Medicare Other

## 2013-10-19 ENCOUNTER — Telehealth: Payer: Self-pay | Admitting: Internal Medicine

## 2013-10-19 ENCOUNTER — Ambulatory Visit (HOSPITAL_BASED_OUTPATIENT_CLINIC_OR_DEPARTMENT_OTHER): Payer: Medicare Other | Admitting: Internal Medicine

## 2013-10-19 VITALS — BP 139/63 | HR 53 | Temp 97.0°F | Resp 17 | Ht 61.0 in | Wt 99.3 lb

## 2013-10-19 DIAGNOSIS — D473 Essential (hemorrhagic) thrombocythemia: Secondary | ICD-10-CM

## 2013-10-19 LAB — CBC WITH DIFFERENTIAL/PLATELET
BASO%: 0.5 % (ref 0.0–2.0)
EOS%: 0.7 % (ref 0.0–7.0)
LYMPH%: 17.5 % (ref 14.0–49.7)
MCHC: 33.6 g/dL (ref 31.5–36.0)
MCV: 127 fL — ABNORMAL HIGH (ref 79.5–101.0)
MONO%: 10.1 % (ref 0.0–14.0)
Platelets: 474 10*3/uL — ABNORMAL HIGH (ref 145–400)
RBC: 3.02 10*6/uL — ABNORMAL LOW (ref 3.70–5.45)
WBC: 5.6 10*3/uL (ref 3.9–10.3)

## 2013-10-19 LAB — COMPREHENSIVE METABOLIC PANEL (CC13)
ALT: 14 U/L (ref 0–55)
Alkaline Phosphatase: 50 U/L (ref 40–150)
Sodium: 142 mEq/L (ref 136–145)
Total Bilirubin: 0.53 mg/dL (ref 0.20–1.20)
Total Protein: 6.8 g/dL (ref 6.4–8.3)

## 2013-10-19 NOTE — Telephone Encounter (Signed)
Gave pt appt for for lab and MD on February and April 2015

## 2013-10-19 NOTE — Patient Instructions (Signed)
Hydroxyurea capsules What is this medicine? HYDROXYUREA (hye drox ee yoor EE a) is a chemotherapy drug. It slows the growth of cancer cells. This medicine is used to treat certain leukemias, skin cancer, head and neck cancer, and advanced ovarian cancer. It is also used to control the painful crises of sickle cell anemia. This medicine may be used for other purposes; ask your health care provider or pharmacist if you have questions. COMMON BRAND NAME(S): Droxia, Hydrea What should I tell my health care provider before I take this medicine? They need to know if you have any of these conditions: -immune system problems -infection (especially a virus infection such as chickenpox, cold sores, or herpes) -kidney disease -low blood counts, like low white cell, platelet, or red cell counts -previous or ongoing radiation therapy -an unusual or allergic reaction to hydroxyurea, other chemotherapy, other medicines, foods, dyes, or preservatives -pregnant or trying to get pregnant -breast-feeding How should I use this medicine? Take this medicine by mouth with a glass of water. Follow the directions on the prescription label. Take your medicine at regular intervals. Do not take it more often than directed. Do not stop taking except on your doctor's advice. People who are not taking this medicine should not be exposed to it. Wash your hands before and after handling your bottle or medicine. Caregivers should wear disposable gloves if they must touch the bottle or medicine. Clean up any medicine powder that spills with a damp disposable towel and throw the towel away in a closed container, such as a plastic bag. Talk to your pediatrician regarding the use of this medicine in children. Special care may be needed. Patients over 65 years old may have a stronger reaction and need a smaller dose. Overdosage: If you think you have taken too much of this medicine contact a poison control center or emergency room at  once. NOTE: This medicine is only for you. Do not share this medicine with others. What if I miss a dose? If you miss a dose, take it as soon as you can. If it is almost time for your next dose, take only that dose. Do not take double or extra doses. What may interact with this medicine? -didanosine -other chemotherapy agents -stavudine -tenofovir -vaccines This list may not describe all possible interactions. Give your health care provider a list of all the medicines, herbs, non-prescription drugs, or dietary supplements you use. Also tell them if you smoke, drink alcohol, or use illegal drugs. Some items may interact with your medicine. What should I watch for while using this medicine? This drug may make you feel generally unwell. This is not uncommon, as chemotherapy can affect healthy cells as well as cancer cells. Report any side effects. Continue your course of treatment even though you feel ill unless your doctor tells you to stop. You will receive regular blood tests during your treatment. Call your doctor or health care professional for advice if you get a fever, chills or sore throat, or other symptoms of a cold or flu. Do not treat yourself. This drug decreases your body's ability to fight infections. Try to avoid being around people who are sick. This medicine may increase your risk to bruise or bleed. Call your doctor or health care professional if you notice any unusual bleeding. Be careful brushing and flossing your teeth or using a toothpick because you may get an infection or bleed more easily. If you have any dental work done, tell your dentist you are   receiving this medicine. Avoid taking products that contain aspirin, acetaminophen, ibuprofen, naproxen, or ketoprofen unless instructed by your doctor. These medicines may hide a fever. Do not become pregnant while taking this medicine. Women should inform their doctor if they wish to become pregnant or think they might be  pregnant. There is a potential for serious side effects to an unborn child. Men should inform their doctors if they wish to father a child. This medicine may lower sperm counts. Talk to your health care professional or pharmacist for more information. Do not breast-feed an infant while taking this medicine. What side effects may I notice from receiving this medicine? Side effects that you should report to your doctor or health care professional as soon as possible: -allergic reactions like skin rash, itching or hives, swelling of the face, lips, or tongue -low blood counts - this medicine may decrease the number of white blood cells, red blood cells and platelets. You may be at increased risk for infections and bleeding. -signs of infection - fever or chills, cough, sore throat, pain or difficulty passing urine -signs of decreased platelets or bleeding - bruising, pinpoint red spots on the skin, black, tarry stools, blood in the urine -signs of decreased red blood cells - unusually weak or tired, fainting spells, lightheadedness -breathing problems -burning, redness or pain at the site of any radiation therapy -changes in skin color -confusion -mouth sores -pain, tingling, numbness in the hands or feet -seizures -skin ulcers -trouble passing urine or change in the amount of urine -vomiting Side effects that usually do not require medical attention (report to your doctor or health care professional if they continue or are bothersome): -headache -loss of appetite -red color to the face This list may not describe all possible side effects. Call your doctor for medical advice about side effects. You may report side effects to FDA at 1-800-FDA-1088. Where should I keep my medicine? Keep out of the reach of children. Store at room temperature between 15 and 30 degrees C (59 and 86 degrees F). Keep tightly closed. Throw away any unused medicine after the expiration date. NOTE: This sheet is a  summary. It may not cover all possible information. If you have questions about this medicine, talk to your doctor, pharmacist, or health care provider.  2014, Elsevier/Gold Standard. (2008-03-09 15:03:29)  

## 2013-10-19 NOTE — Progress Notes (Signed)
Hematology and Oncology Follow Up Visit  [name] [name] [id number] 1923/07/15 79 y.o. 07/19/2013 8:00 pm MAST, MAN X, NP  Chief Complaint: Essential Thrombocytosis  Principle Diagnosis: Essential thrombocytosis, intitiated on Hydrea on 07/05/2011; JAK2 mutation analysis performed on 06/26/2011 was negative. Quantitative RT-PCR for BCR-ABL 1 performed on 07/07/2011 was not detected. Initially the patient was on Hydrea 1500 mg QOD and 1000 mg QOD, with significant drop in platelet count to 60,000 and neutropenia, adjusted to current dose of  Hydrea 500 mg ii po qod and i po qod, with adequate values.  Prior Therapy:  None  Current therapy: Hydrea 1,000 mg qod and 500 mg qod.  Interim History:  [name] [name] returns to the office of the cancer Center for a followup visit. She is overall in good health. She remains active. She denies any headaches, nausea, vomiting, shortness of breath, or cardiac complaints. No GI or GU complaints . Denies any bleeding issues, or mucositis. Her appetite is adequate.  She is reports that her grandson [name] [name] is in [place] teaching.   Medications: I have reviewed the patient's current medications.  Current Outpatient Prescriptions  Medication Sig Dispense Refill  . atenolol (TENORMIN) 50 MG tablet Take by mouth. Take 1/2 tablet twice daily for blood pressure      . calcitonin, salmon, (MIACALCIN/FORTICAL) 200 UNIT/ACT nasal spray USE 1 SQUIRT IN ALTERNATING NOSTRILS DAILY FOR BONES  3.7 mL  3  . Calcium-Vitamin D-Vitamin K (VIACTIV) 500-500-40 MG-UNT-MCG CHEW Chew 1 each by mouth daily.      . flurbiprofen (ANSAID) 100 MG tablet 100 mg daily.       . Glucosamine-Chondroit-Vit C-Mn (GLUCOSAMINE CHONDR 1500 COMPLX) CAPS Take 2 capsules by mouth daily.       . hydroxyurea (HYDREA) 500 MG capsule Take 2 capsules (1,000 mg total) by mouth every other day. Alternating with 500 mg or as directed.  Take with food/  100 capsule  1  . latanoprost (XALATAN) 0.005 %  ophthalmic solution Place 1 drop into both eyes daily.      . Magnesium 250 MG TABS Take by mouth. Take one tablet daily to help with constipation      . URELLE (URELLE/URISED) 81 MG TABS Take 1 tablet by mouth daily. Take one every other day      . vitamin E 400 UNIT capsule Take 400 Units by mouth every other day.       No current facility-administered medications for this visit.   Allergies:  Allergies  Allergen Reactions  . Darvocet [Propoxyphene-Acetaminophen] Nausea And Vomiting  . Darvon Nausea And Vomiting  . Meperidine And Related Nausea And Vomiting  . Vicodin [Hydrocodone-Acetaminophen] Nausea And Vomiting   Past Medical History, Surgical history, Social history, and Family History were reviewed and updated.  Review of Systems: Constitutional:  Negative for fever, chills, night sweats, anorexia, weight loss, pain. Cardiovascular: no chest pain or dyspnea on exertion Respiratory: no cough, shortness of breath, or wheezing Neurological: no TIA or stroke symptoms Dermatological: negative for rash ENT: negative for - epistaxis or headaches Skin: Negative. Gastrointestinal: no abdominal pain, change in bowel habits, or black or bloody stools Genito-Urinary: no dysuria, trouble voiding, or hematuria Hematological and Lymphatic: negative for - bleeding problems or blood clots Breast: negative for breast lumps Musculoskeletal: negative for - gait disturbance Remaining ROS negative. Physical Exam: Blood pressure 139/63, pulse 53, temperature 97 F (36.1 C), temperature source Oral, resp. rate 17, height 5\' 1"  (1.549 m), weight 99 lb 4.8 oz (45.042  kg), SpO2 96.00%. ECOG: 0 General appearance: alert, cooperative, appears stated age and no distress Head: Normocephalic, without obvious abnormality, atraumatic Neck: no adenopathy, supple, symmetrical, trachea midline and thyroid not enlarged, symmetric, no tenderness/mass/nodules HEENT: OP clear; PERRL; EOMi.  Lymph nodes:  Cervical, supraclavicular, and axillary nodes normal. Heart:regular rate and rhythm, S1, S2 normal, no murmur, click, rub or gallop Lung:chest clear, no wheezing, rales, normal symmetric air entry Abdomin: soft, non-tender, without masses or organomegaly EXT:No peripheral edema   Lab Results: Lab Results  Component Value Date   WBC 5.6 10/19/2013   HGB 12.9 10/19/2013   HCT 38.3 10/19/2013   MCV 127.0* 10/19/2013   PLT 474* 10/19/2013     Chemistry      Component Value Date/Time   NA 142 10/19/2013 0910   NA 139 06/29/2013 1119   NA 140 06/24/2012 0902   K 4.0 10/19/2013 0910   K 4.1 06/29/2013 1119   CL 101 06/29/2013 1119   CO2 26 10/19/2013 0910   CO2 26 06/29/2013 1119   BUN 20.0 10/19/2013 0910   BUN 10 06/29/2013 1119   BUN 19 06/24/2012 0902   CREATININE 0.8 10/19/2013 0910   CREATININE 0.61 06/29/2013 1119      Component Value Date/Time   CALCIUM 9.1 10/19/2013 0910   CALCIUM 8.6 06/29/2013 1119   ALKPHOS 50 10/19/2013 0910   ALKPHOS 54 06/26/2011 1448   AST 24 10/19/2013 0910   AST 22 06/26/2011 1448   ALT 14 10/19/2013 0910   ALT 12 06/26/2011 1448   BILITOT 0.53 10/19/2013 0910   BILITOT 0.4 06/26/2011 1448     Radiological Studies: No results found.   Impression and Plan: 1. Thrombocytosis.  -- [name] [name] is an 79 year old woman with essential thrombocytosis, currently on Hydrea, tolerating this current dosage well.  -- We reviewed her labs, her plts of 474 down from 542 from prior levels.  We [name] repeat her CBC in three month and if increased [name] slowly titrate her hydrea up.   -- She [name] return in 6 months for a followup visit with at which time CBC with differential and CMET [name] be drawn.  Spent more than half the time coordinating care.    [name] Mckiver, MD 10/20/2013  6:00 pm

## 2013-10-30 ENCOUNTER — Other Ambulatory Visit: Payer: Self-pay | Admitting: Oncology

## 2013-10-30 DIAGNOSIS — D473 Essential (hemorrhagic) thrombocythemia: Secondary | ICD-10-CM

## 2013-11-29 ENCOUNTER — Other Ambulatory Visit: Payer: Self-pay | Admitting: Internal Medicine

## 2013-12-01 ENCOUNTER — Other Ambulatory Visit: Payer: Self-pay | Admitting: Internal Medicine

## 2013-12-20 ENCOUNTER — Other Ambulatory Visit (HOSPITAL_BASED_OUTPATIENT_CLINIC_OR_DEPARTMENT_OTHER): Payer: Medicare Other

## 2013-12-20 DIAGNOSIS — D473 Essential (hemorrhagic) thrombocythemia: Secondary | ICD-10-CM | POA: Diagnosis not present

## 2013-12-20 DIAGNOSIS — D75839 Thrombocytosis, unspecified: Secondary | ICD-10-CM

## 2013-12-20 LAB — CBC WITH DIFFERENTIAL/PLATELET
BASO%: 0.5 % (ref 0.0–2.0)
BASOS ABS: 0 10*3/uL (ref 0.0–0.1)
EOS ABS: 0.1 10*3/uL (ref 0.0–0.5)
EOS%: 1.2 % (ref 0.0–7.0)
HCT: 37.4 % (ref 34.8–46.6)
HGB: 12.7 g/dL (ref 11.6–15.9)
LYMPH#: 0.8 10*3/uL — AB (ref 0.9–3.3)
LYMPH%: 18.6 % (ref 14.0–49.7)
MCH: 43.1 pg — ABNORMAL HIGH (ref 25.1–34.0)
MCHC: 34 g/dL (ref 31.5–36.0)
MCV: 127 fL — AB (ref 79.5–101.0)
MONO#: 0.5 10*3/uL (ref 0.1–0.9)
MONO%: 10.8 % (ref 0.0–14.0)
NEUT#: 3 10*3/uL (ref 1.5–6.5)
NEUT%: 68.9 % (ref 38.4–76.8)
Platelets: 441 10*3/uL — ABNORMAL HIGH (ref 145–400)
RBC: 2.94 10*6/uL — ABNORMAL LOW (ref 3.70–5.45)
RDW: 14.2 % (ref 11.2–14.5)
WBC: 4.4 10*3/uL (ref 3.9–10.3)

## 2014-01-16 DIAGNOSIS — N390 Urinary tract infection, site not specified: Secondary | ICD-10-CM | POA: Diagnosis not present

## 2014-01-16 DIAGNOSIS — R319 Hematuria, unspecified: Secondary | ICD-10-CM | POA: Diagnosis not present

## 2014-01-19 DIAGNOSIS — N390 Urinary tract infection, site not specified: Secondary | ICD-10-CM | POA: Diagnosis not present

## 2014-01-23 DIAGNOSIS — R209 Unspecified disturbances of skin sensation: Secondary | ICD-10-CM | POA: Diagnosis not present

## 2014-01-23 DIAGNOSIS — N8111 Cystocele, midline: Secondary | ICD-10-CM | POA: Diagnosis not present

## 2014-02-09 ENCOUNTER — Other Ambulatory Visit: Payer: Self-pay | Admitting: Internal Medicine

## 2014-02-14 DIAGNOSIS — N816 Rectocele: Secondary | ICD-10-CM | POA: Diagnosis not present

## 2014-02-14 DIAGNOSIS — N3010 Interstitial cystitis (chronic) without hematuria: Secondary | ICD-10-CM | POA: Diagnosis not present

## 2014-02-16 ENCOUNTER — Ambulatory Visit (HOSPITAL_BASED_OUTPATIENT_CLINIC_OR_DEPARTMENT_OTHER): Payer: Medicare Other | Admitting: Internal Medicine

## 2014-02-16 ENCOUNTER — Other Ambulatory Visit (HOSPITAL_BASED_OUTPATIENT_CLINIC_OR_DEPARTMENT_OTHER): Payer: Medicare Other

## 2014-02-16 ENCOUNTER — Telehealth: Payer: Self-pay | Admitting: Internal Medicine

## 2014-02-16 VITALS — BP 149/60 | HR 50 | Temp 97.5°F | Resp 18 | Ht 61.0 in | Wt 99.0 lb

## 2014-02-16 DIAGNOSIS — D473 Essential (hemorrhagic) thrombocythemia: Secondary | ICD-10-CM

## 2014-02-16 DIAGNOSIS — D75839 Thrombocytosis, unspecified: Secondary | ICD-10-CM

## 2014-02-16 LAB — CBC WITH DIFFERENTIAL/PLATELET
BASO%: 1.1 % (ref 0.0–2.0)
BASOS ABS: 0 10*3/uL (ref 0.0–0.1)
EOS ABS: 0.1 10*3/uL (ref 0.0–0.5)
EOS%: 1.3 % (ref 0.0–7.0)
HCT: 36.8 % (ref 34.8–46.6)
HEMOGLOBIN: 12.4 g/dL (ref 11.6–15.9)
LYMPH%: 19.7 % (ref 14.0–49.7)
MCH: 43.5 pg — ABNORMAL HIGH (ref 25.1–34.0)
MCHC: 33.7 g/dL (ref 31.5–36.0)
MCV: 128.8 fL — AB (ref 79.5–101.0)
MONO#: 0.4 10*3/uL (ref 0.1–0.9)
MONO%: 9.3 % (ref 0.0–14.0)
NEUT%: 68.6 % (ref 38.4–76.8)
NEUTROS ABS: 3.2 10*3/uL (ref 1.5–6.5)
PLATELETS: 419 10*3/uL — AB (ref 145–400)
RBC: 2.86 10*6/uL — ABNORMAL LOW (ref 3.70–5.45)
RDW: 13.8 % (ref 11.2–14.5)
WBC: 4.6 10*3/uL (ref 3.9–10.3)
lymph#: 0.9 10*3/uL (ref 0.9–3.3)

## 2014-02-16 LAB — COMPREHENSIVE METABOLIC PANEL (CC13)
ALBUMIN: 3.3 g/dL — AB (ref 3.5–5.0)
ALT: <6 U/L (ref 0–55)
AST: 14 U/L (ref 5–34)
Alkaline Phosphatase: 40 U/L (ref 40–150)
Anion Gap: 8 mEq/L (ref 3–11)
BILIRUBIN TOTAL: 0.58 mg/dL (ref 0.20–1.20)
BUN: 18.7 mg/dL (ref 7.0–26.0)
CO2: 28 mEq/L (ref 22–29)
Calcium: 9 mg/dL (ref 8.4–10.4)
Chloride: 106 mEq/L (ref 98–109)
Creatinine: 0.8 mg/dL (ref 0.6–1.1)
GLUCOSE: 95 mg/dL (ref 70–140)
Potassium: 4.1 mEq/L (ref 3.5–5.1)
Sodium: 142 mEq/L (ref 136–145)
Total Protein: 5.7 g/dL — ABNORMAL LOW (ref 6.4–8.3)

## 2014-02-16 NOTE — Telephone Encounter (Signed)
s.w. pt and advised on July and Oct appt....pt ok and aware

## 2014-02-16 NOTE — Progress Notes (Signed)
Hematology and Oncology Follow Up Visit  [name] [name] [id number] 17-Sep-1923 80 y.o. MAST, MAN X, NP  Chief Complaint: Essential Thrombocytosis  Principle Diagnosis: Essential thrombocytosis, intitiated on Hydrea on 07/05/2011; JAK2 mutation analysis performed on 06/26/2011 was negative. Quantitative RT-PCR for BCR-ABL 1 performed on 07/07/2011 was not detected. Initially the patient was on Hydrea 1500 mg QOD and 1000 mg QOD, with significant drop in platelet count to 60,000 and neutropenia, adjusted to current dose of  Hydrea 500 mg ii po qod and i po qod, with adequate values.  Prior Therapy:  None  Current therapy: Hydrea 1,000 mg qod and 500 mg qod.  Interim History:  [name] [name] returns to the office of the [facility] for a followup visit. She is overall in good health. She remains active. She denies any headaches, nausea, vomiting, shortness of breath, or cardiac complaints. No GI or GU complaints . Denies any bleeding issues, or mucositis. Her appetite is adequate. Had UTI about two months ago with complete resolution with antibiotics.  Arthritis in the hands is stable.  No hospitalizations or emergency room visits.  Dr Nyoka Cowden is here PCP and visits regularly at [facility].  Her appetite and weight is stablel.   Medications: I have reviewed the patient's current medications.  Current Outpatient Prescriptions  Medication Sig Dispense Refill  . atenolol (TENORMIN) 50 MG tablet Take by mouth. Take 1/2 tablet twice daily for blood pressure      . calcitonin, salmon, (MIACALCIN/FORTICAL) 200 UNIT/ACT nasal spray USE 1 SQUIRT IN ALTERNATING NOSTRILS DAILY FOR BONES  3.7 mL  5  . Calcium-Vitamin D-Vitamin K (VIACTIV) 809-983-38 MG-UNT-MCG CHEW Chew 1 each by mouth daily.      . flurbiprofen (ANSAID) 100 MG tablet TAKE 1 TABLET BY MOUTH EVERY DAY  90 tablet  0  . Glucosamine-Chondroit-Vit C-Mn (GLUCOSAMINE CHONDR 1500 COMPLX) CAPS Take 2 capsules by mouth daily.       . hydroxyurea (HYDREA)  500 MG capsule TAKE 2 CAPSULES EVERY OTHER DAY ALTERNATING WITH 500MG  OR AS DIRECTED (TAKE WITH FOOD)  100 capsule  3  . latanoprost (XALATAN) 0.005 % ophthalmic solution Place 1 drop into both eyes daily.      . Magnesium 250 MG TABS Take by mouth. Take one tablet daily to help with constipation      . URELLE (URELLE/URISED) 81 MG TABS Take 1 tablet by mouth daily. Take one every other day      . vitamin E 400 UNIT capsule Take 400 Units by mouth every other day.       No current facility-administered medications for this visit.   Allergies:  Allergies  Allergen Reactions  . Darvocet [Propoxyphene N-Acetaminophen] Nausea And Vomiting  . Darvon Nausea And Vomiting  . Meperidine And Related Nausea And Vomiting  . Vicodin [Hydrocodone-Acetaminophen] Nausea And Vomiting   Past Medical History, Surgical history, Social history, and Family History were reviewed and updated.  Review of Systems: Constitutional:  Negative for fever, chills, night sweats, anorexia, weight loss, pain. Cardiovascular: no chest pain or dyspnea on exertion Respiratory: no cough, shortness of breath, or wheezing Neurological: no TIA or stroke symptoms Dermatological: negative for rash ENT: negative for - epistaxis or headaches Skin: Negative. Gastrointestinal: no abdominal pain, change in bowel habits, or black or bloody stools Genito-Urinary: no dysuria, trouble voiding, or hematuria Hematological and Lymphatic: negative for - bleeding problems or blood clots Breast: negative for breast lumps Musculoskeletal: negative for - gait disturbance Remaining ROS negative. Physical Exam: Blood  pressure 149/60, pulse 50, temperature 97.5 F (36.4 C), temperature source Oral, resp. rate 18, height 5\' 1"  (1.549 m), weight 99 lb (44.906 kg). ECOG: 0 General appearance: alert, cooperative, appears stated age and no distress Head: Normocephalic, without obvious abnormality, atraumatic Neck: no adenopathy, supple,  symmetrical, trachea midline and thyroid not enlarged, symmetric, no tenderness/mass/nodules HEENT: OP clear; PERRL; EOMi.  Lymph nodes: Cervical, supraclavicular, and axillary nodes normal. Heart:regular rate and rhythm, S1, S2 normal, no murmur, click, rub or gallop Lung:chest clear, no wheezing, rales, normal symmetric air entry Abdomin: soft, non-tender, without masses or organomegaly EXT:No peripheral edema   Lab Results: Lab Results  Component Value Date   WBC 4.6 02/16/2014   HGB 12.4 02/16/2014   HCT 36.8 02/16/2014   MCV 128.8* 02/16/2014   PLT 419* 02/16/2014     Chemistry      Component Value Date/Time   NA 142 10/19/2013 0910   NA 139 06/29/2013 1119   NA 140 06/24/2012 0902   K 4.0 10/19/2013 0910   K 4.1 06/29/2013 1119   CL 101 06/29/2013 1119   CO2 26 10/19/2013 0910   CO2 26 06/29/2013 1119   BUN 20.0 10/19/2013 0910   BUN 10 06/29/2013 1119   BUN 19 06/24/2012 0902   CREATININE 0.8 10/19/2013 0910   CREATININE 0.61 06/29/2013 1119      Component Value Date/Time   CALCIUM 9.1 10/19/2013 0910   CALCIUM 8.6 06/29/2013 1119   ALKPHOS 50 10/19/2013 0910   ALKPHOS 54 06/26/2011 1448   AST 24 10/19/2013 0910   AST 22 06/26/2011 1448   ALT 14 10/19/2013 0910   ALT 12 06/26/2011 1448   BILITOT 0.53 10/19/2013 0910   BILITOT 0.4 06/26/2011 1448     Radiological Studies: No results found.   Impression and Plan: 1. Thrombocytosis.  -- [name] [name] is an 80 year old woman with essential thrombocytosis, currently on Hydrea, tolerating this current dosage well. Her Chemisitries are still pending.  -- We reviewed her labs, her plts of 419  Are down from 4441 down from 474 from prior levels.  We will repeat her CBC in three months and if increased will slowly titrate her hydrea up.   -- She will return in 6 months for a followup visit with at which time CBC with differential and CMET will be drawn.  Spent more than half the time coordinating care.    Concha Norway,  MD 02/16/2014 8:50 AM

## 2014-03-09 ENCOUNTER — Other Ambulatory Visit: Payer: Self-pay | Admitting: Internal Medicine

## 2014-03-09 DIAGNOSIS — H26499 Other secondary cataract, unspecified eye: Secondary | ICD-10-CM | POA: Diagnosis not present

## 2014-03-09 DIAGNOSIS — H18519 Endothelial corneal dystrophy, unspecified eye: Secondary | ICD-10-CM | POA: Diagnosis not present

## 2014-03-09 DIAGNOSIS — H04129 Dry eye syndrome of unspecified lacrimal gland: Secondary | ICD-10-CM | POA: Diagnosis not present

## 2014-03-09 DIAGNOSIS — Z961 Presence of intraocular lens: Secondary | ICD-10-CM | POA: Diagnosis not present

## 2014-03-09 DIAGNOSIS — H4011X0 Primary open-angle glaucoma, stage unspecified: Secondary | ICD-10-CM | POA: Diagnosis not present

## 2014-03-15 DIAGNOSIS — N3010 Interstitial cystitis (chronic) without hematuria: Secondary | ICD-10-CM | POA: Diagnosis not present

## 2014-03-15 DIAGNOSIS — N816 Rectocele: Secondary | ICD-10-CM | POA: Diagnosis not present

## 2014-03-22 ENCOUNTER — Non-Acute Institutional Stay: Payer: Medicare Other | Admitting: Internal Medicine

## 2014-03-22 ENCOUNTER — Encounter: Payer: Self-pay | Admitting: Internal Medicine

## 2014-03-22 VITALS — BP 130/58 | HR 72 | Temp 97.8°F | Resp 18 | Ht 61.0 in | Wt 98.8 lb

## 2014-03-22 DIAGNOSIS — E785 Hyperlipidemia, unspecified: Secondary | ICD-10-CM

## 2014-03-22 DIAGNOSIS — D473 Essential (hemorrhagic) thrombocythemia: Secondary | ICD-10-CM

## 2014-03-22 DIAGNOSIS — R490 Dysphonia: Secondary | ICD-10-CM

## 2014-03-22 DIAGNOSIS — I10 Essential (primary) hypertension: Secondary | ICD-10-CM

## 2014-03-22 DIAGNOSIS — D649 Anemia, unspecified: Secondary | ICD-10-CM | POA: Diagnosis not present

## 2014-03-22 DIAGNOSIS — R413 Other amnesia: Secondary | ICD-10-CM

## 2014-03-22 DIAGNOSIS — R634 Abnormal weight loss: Secondary | ICD-10-CM

## 2014-03-22 NOTE — Progress Notes (Signed)
Patient ID: [name] [name], female   DOB: [birth year], 80 y.o.   MRN: [id number]    Location:  [facility]   Place of Service: Clinic (12)  PCP: Estill Dooms, MD  Code Status: LIVING WILL  Extended Emergency Contact Information Primary Emergency Contact: [name] Phone: [phone] Relation: Friend  Allergies  Allergen Reactions  . Darvocet [Propoxyphene N-Acetaminophen] Nausea And Vomiting  . Darvon Nausea And Vomiting  . Meperidine And Related Nausea And Vomiting  . Vicodin [Hydrocodone-Acetaminophen] Nausea And Vomiting    Chief Complaint  Patient presents with  . Annual Exam    HPI:  Essential thrombocythemia: Stable  Anemia, unspecified: Resolved. Hemoglobin 12.4 g percent  Hoarse: Persistent since September 2014 she had bronchitis. Etiology uncertain. It seems to be worse on humid and chilly days. There is no pain in the throat when speaking or swallowing.  Hyperlipidemia: Controlled  Hypertension: Controlled  Loss of weight: Stabilized  Memory : Unchanged      Past Medical History  Diagnosis Date  . Glaucoma   . Hypertension   . Arthritis   . Neck fracture   . Abdominal pain, unspecified site 08/11/2012  . Essential thrombocythemia 06/18/2011  . Anemia, unspecified 06/18/2011  . 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 Loss of weight 06/18/2011  . Lumbago 12/18/2010  . Sciatica 06/19/2010  . Memory loss 12/19/2009  . Diverticulosis of colon (without mention of hemorrhage) 04/20/2007  . Disorder of bone and cartilage, unspecified 04/20/2007  . Hemorrhage of gastrointestinal tract, unspecified 03/09/2007  . Other and unspecified hyperlipidemia 09/01/2006  . BBB (bundle branch block) 03/03/2006  . Cardiac dysrhythmia, unspecified 03/03/2006  . Chronic interstitial cystitis 03/03/2006  . Osteoarthrosis, unspecified whether generalized or localized, unspecified site 03/03/2006  . Hematuria, unspecified 04/22/2004  .  Herpes zoster without mention of complication 7/82/4235  . 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 Appendectomy    . Tonsillectomy    . Abdominal hysterectomy    . Cataract extraction w/ intraocular lens  implant, bilateral    . Breast cyst excision Bilateral     benign    CONSULTANTS Cardiologist-Dr.Tilley Urology-Dr.Wrenn Dermatologist-Dr.Nolan Dentist-Dr.Grossman Eye-Dr.Groat GI -Dr. Oletta Lamas Oncology: Dr. Concha Norway   PAST PROCEDURES 2000- [place] for bladder problem- Dr. Narda Amber 2005- Bone Density- [place]- [place] for bladder 04/07/07- Colonoscopy diverticulosis/hemorrhoids- Dr. Oletta Lamas 01/11/07 Bone density: Osteopenia 06/19/2011-Abdomen Ultrasound: No abdominal aortic aneurysm. large left renal cyst measures               up to 4.2cm 11/09/2011 Mammogram -negative  Social History: History   Social History  . Marital Status: Widowed    Spouse Name: N/A    Number of Children: N/A  . Years of Education: N/A   Social History Main Topics  . Smoking status: Never Smoker   . Smokeless tobacco: Never Used  . Alcohol Use: No  . Drug Use: No  . Sexual Activity: No   Other Topics Concern  . None   Social History Narrative   Lives at [facility]    Family History Family Status  Relation Status Death Age  . Mother Deceased 76    killed when plane hit home  . Father Deceased 74  . Sister Deceased     killed when plane hit home  . Brother Deceased     killed when plane hit home  . Son Alive   . Son [employer]  Family History  Problem Relation Age of Onset  . Heart disease Father     MI     Medications: Patient's Medications  New Prescriptions   No medications on file  Previous Medications   ATENOLOL (TENORMIN) 50 MG TABLET    Take by mouth. Take 1/2 tablet twice daily for blood pressure   CALCITONIN, SALMON, (MIACALCIN/FORTICAL) 200 UNIT/ACT NASAL SPRAY    USE 1 SQUIRT IN ALTERNATING NOSTRILS  DAILY FOR BONES   CALCIUM-VITAMIN D-VITAMIN K (VIACTIV) 295-284-13 MG-UNT-MCG CHEW    Chew 1 each by mouth daily.   DORZOLAMIDE-TIMOLOL (COSOPT) 22.3-6.8 MG/ML OPHTHALMIC SOLUTION    Place 1 drop into both eyes 2 (two) times daily.    FLURBIPROFEN (ANSAID) 100 MG TABLET    TAKE 1 TABLET BY MOUTH EVERY DAY   GLUCOSAMINE-CHONDROIT-VIT C-MN (GLUCOSAMINE CHONDR 1500 COMPLX) CAPS    Take 2 capsules by mouth daily.    HYDROXYUREA (HYDREA) 500 MG CAPSULE    TAKE 2 CAPSULES EVERY OTHER DAY ALTERNATING WITH 500MG  OR AS DIRECTED (TAKE WITH FOOD)   LATANOPROST (XALATAN) 0.005 % OPHTHALMIC SOLUTION    Place 1 drop into both eyes daily.   MAGNESIUM 250 MG TABS    Take by mouth. Take one tablet daily to help with constipation   URELLE (URELLE/URISED) 81 MG TABS    Take 1 tablet by mouth daily. Take one every other day   VITAMIN E 400 UNIT CAPSULE    Take 400 Units by mouth every other day.  Modified Medications   No medications on file  Discontinued Medications   No medications on file    Immunization History  Administered Date(s) Administered  . Influenza Whole 08/09/2012, 07/10/2013  . Pneumococcal Polysaccharide-23 11/09/2004  . Zoster 10/07/2006     Review of Systems  Constitutional: Negative for fever, diaphoresis, activity change, appetite change, fatigue and unexpected weight change.  HENT: Positive for voice change (hoarse since September 2014).   Eyes: Negative.   Respiratory: Negative.   Cardiovascular: Negative.   Gastrointestinal: Negative.   Endocrine: Negative.   Genitourinary: Negative.   Musculoskeletal: Negative.   Neurological: Negative.   Hematological:       Thrombocythemia  Psychiatric/Behavioral: Negative.       Filed Vitals:   03/22/14 1458  BP: 130/58  Pulse: 72  Temp: 97.8 F (36.6 C)  TempSrc: Oral  Resp: 18  Height: 5\' 1"  (1.549 m)  Weight: 98 lb 12.8 oz (44.815 kg)   Body mass index is 18.68 kg/(m^2).  Physical Exam  Constitutional: She is  oriented to person, place, and time. She appears well-developed and well-nourished. No distress.  HENT:  Head: Normocephalic and atraumatic.  Right Ear: External ear normal.  Left Ear: External ear normal.  Nose: Nose normal.  Mouth/Throat: Oropharynx is clear and moist.  Eyes: Conjunctivae and EOM are normal. Pupils are equal, round, and reactive to light.  Corrective lenses  Neck: Neck supple. No JVD present. No tracheal deviation present. No thyromegaly present.  Cardiovascular: Normal rate and regular rhythm.  Exam reveals no gallop and no friction rub.   Murmur (2/6/LSB SEM) heard. Pulmonary/Chest: No respiratory distress. She has no wheezes. She has no rales.  Abdominal: She exhibits no distension and no mass. There is no tenderness.  Musculoskeletal: She exhibits no edema and no tenderness.  Heberden's nodes. Bouchard's nodes. Contracture of the right hand.  Lymphadenopathy:    She has no cervical adenopathy.  Neurological: She is alert and oriented to person, place, and time.  She has normal reflexes. No cranial nerve deficit. Coordination normal.  Skin: No rash noted. No erythema. No pallor.  Psychiatric: She has a normal mood and affect. Her behavior is normal. Judgment and thought content normal.        Labs reviewed: Appointment on 02/16/2014  Component Date Value Ref Range Status  . WBC 02/16/2014 4.6  3.9 - 10.3 10e3/uL Final  . NEUT# 02/16/2014 3.2  1.5 - 6.5 10e3/uL Final  . HGB 02/16/2014 12.4  11.6 - 15.9 g/dL Final  . HCT 02/16/2014 36.8  34.8 - 46.6 % Final  . Platelets 02/16/2014 419* 145 - 400 10e3/uL Final  . MCV 02/16/2014 128.8* 79.5 - 101.0 fL Final  . MCH 02/16/2014 43.5* 25.1 - 34.0 pg Final  . MCHC 02/16/2014 33.7  31.5 - 36.0 g/dL Final  . RBC 02/16/2014 2.86* 3.70 - 5.45 10e6/uL Final  . RDW 02/16/2014 13.8  11.2 - 14.5 % Final  . lymph# 02/16/2014 0.9  0.9 - 3.3 10e3/uL Final  . MONO# 02/16/2014 0.4  0.1 - 0.9 10e3/uL Final  . Eosinophils  Absolute 02/16/2014 0.1  0.0 - 0.5 10e3/uL Final  . Basophils Absolute 02/16/2014 0.0  0.0 - 0.1 10e3/uL Final  . NEUT% 02/16/2014 68.6  38.4 - 76.8 % Final  . LYMPH% 02/16/2014 19.7  14.0 - 49.7 % Final  . MONO% 02/16/2014 9.3  0.0 - 14.0 % Final  . EOS% 02/16/2014 1.3  0.0 - 7.0 % Final  . BASO% 02/16/2014 1.1  0.0 - 2.0 % Final  . Sodium 02/16/2014 142  136 - 145 mEq/L Final  . Potassium 02/16/2014 4.1  3.5 - 5.1 mEq/L Final  . Chloride 02/16/2014 106  98 - 109 mEq/L Final  . CO2 02/16/2014 28  22 - 29 mEq/L Final  . Glucose 02/16/2014 95  70 - 140 mg/dl Final  . BUN 02/16/2014 18.7  7.0 - 26.0 mg/dL Final  . Creatinine 02/16/2014 0.8  0.6 - 1.1 mg/dL Final  . Total Bilirubin 02/16/2014 0.58  0.20 - 1.20 mg/dL Final  . Alkaline Phosphatase 02/16/2014 40  40 - 150 U/L Final  . AST 02/16/2014 14  5 - 34 U/L Final  . ALT 02/16/2014 <6  0 - 55 U/L Final  . Total Protein 02/16/2014 5.7* 6.4 - 8.3 g/dL Final  . Albumin 02/16/2014 3.3* 3.5 - 5.0 g/dL Final  . Calcium 02/16/2014 9.0  8.4 - 10.4 mg/dL Final  . Anion Gap 02/16/2014 8  3 - 11 mEq/L Final     Assessment/Plan  1. Essential thrombocythemia Controlled. Most recent platelet count 419,000.  2. Anemia, unspecified Resolved  3. Hoarse Discussed referral to the ENT for visualization of vocal cords. Patient does not want to go through this at this time. Continue to observe with routine visits.  4. Hyperlipidemia Recheck next visit  5. Hypertension Control  6. Loss of weight Stabilized  7. Memory loss No further changes

## 2014-03-30 DIAGNOSIS — Z961 Presence of intraocular lens: Secondary | ICD-10-CM | POA: Diagnosis not present

## 2014-03-30 DIAGNOSIS — H26499 Other secondary cataract, unspecified eye: Secondary | ICD-10-CM | POA: Diagnosis not present

## 2014-03-30 DIAGNOSIS — H4011X0 Primary open-angle glaucoma, stage unspecified: Secondary | ICD-10-CM | POA: Diagnosis not present

## 2014-04-12 NOTE — Addendum Note (Signed)
Addended by: Estill Dooms on: 04/12/2014 12:48 PM   Modules accepted: Level of Service

## 2014-04-21 ENCOUNTER — Telehealth: Payer: Self-pay | Admitting: Internal Medicine

## 2014-04-21 NOTE — Telephone Encounter (Signed)
HOLIDAY MOVED 7/3 TO 7/2. S/W PT SHE IS AWARE.

## 2014-05-10 ENCOUNTER — Other Ambulatory Visit (HOSPITAL_BASED_OUTPATIENT_CLINIC_OR_DEPARTMENT_OTHER): Payer: Medicare Other

## 2014-05-10 DIAGNOSIS — D473 Essential (hemorrhagic) thrombocythemia: Secondary | ICD-10-CM

## 2014-05-10 DIAGNOSIS — D75839 Thrombocytosis, unspecified: Secondary | ICD-10-CM

## 2014-05-10 LAB — CBC WITH DIFFERENTIAL/PLATELET
BASO%: 1 % (ref 0.0–2.0)
BASOS ABS: 0 10*3/uL (ref 0.0–0.1)
EOS ABS: 0.1 10*3/uL (ref 0.0–0.5)
EOS%: 2 % (ref 0.0–7.0)
HCT: 37.1 % (ref 34.8–46.6)
HEMOGLOBIN: 12.2 g/dL (ref 11.6–15.9)
LYMPH%: 18.2 % (ref 14.0–49.7)
MCH: 42.3 pg — ABNORMAL HIGH (ref 25.1–34.0)
MCHC: 32.8 g/dL (ref 31.5–36.0)
MCV: 129 fL — AB (ref 79.5–101.0)
MONO#: 0.4 10*3/uL (ref 0.1–0.9)
MONO%: 11.1 % (ref 0.0–14.0)
NEUT#: 2.6 10*3/uL (ref 1.5–6.5)
NEUT%: 67.7 % (ref 38.4–76.8)
PLATELETS: 467 10*3/uL — AB (ref 145–400)
RBC: 2.88 10*6/uL — AB (ref 3.70–5.45)
RDW: 13.4 % (ref 11.2–14.5)
WBC: 3.9 10*3/uL (ref 3.9–10.3)
lymph#: 0.7 10*3/uL — ABNORMAL LOW (ref 0.9–3.3)

## 2014-05-11 ENCOUNTER — Other Ambulatory Visit: Payer: Medicare Other

## 2014-06-01 ENCOUNTER — Other Ambulatory Visit: Payer: Self-pay | Admitting: Internal Medicine

## 2014-07-06 ENCOUNTER — Other Ambulatory Visit: Payer: Self-pay | Admitting: Internal Medicine

## 2014-07-31 ENCOUNTER — Telehealth: Payer: Self-pay | Admitting: *Deleted

## 2014-07-31 NOTE — Telephone Encounter (Signed)
[facility] sent a physicians Order for a signature to perform 2 year follow up Bone Density. Stamped and faxed back

## 2014-08-02 DIAGNOSIS — M899 Disorder of bone, unspecified: Secondary | ICD-10-CM | POA: Diagnosis not present

## 2014-08-02 LAB — HM DEXA SCAN

## 2014-08-08 ENCOUNTER — Telehealth: Payer: Self-pay | Admitting: Hematology

## 2014-08-08 NOTE — Telephone Encounter (Signed)
due to CP1 out moved 10/2 appt to 10/9. lmonvm for pt re change w/new d/t.

## 2014-08-10 ENCOUNTER — Ambulatory Visit: Payer: Medicare Other

## 2014-08-10 ENCOUNTER — Other Ambulatory Visit: Payer: Medicare Other

## 2014-08-13 ENCOUNTER — Encounter: Payer: Self-pay | Admitting: *Deleted

## 2014-08-14 DIAGNOSIS — Z23 Encounter for immunization: Secondary | ICD-10-CM | POA: Diagnosis not present

## 2014-08-16 ENCOUNTER — Other Ambulatory Visit: Payer: Self-pay | Admitting: *Deleted

## 2014-08-16 DIAGNOSIS — D473 Essential (hemorrhagic) thrombocythemia: Secondary | ICD-10-CM

## 2014-08-17 ENCOUNTER — Telehealth: Payer: Self-pay | Admitting: Hematology

## 2014-08-17 ENCOUNTER — Encounter: Payer: Self-pay | Admitting: Hematology

## 2014-08-17 ENCOUNTER — Ambulatory Visit (HOSPITAL_BASED_OUTPATIENT_CLINIC_OR_DEPARTMENT_OTHER): Payer: Medicare Other | Admitting: Hematology

## 2014-08-17 ENCOUNTER — Encounter: Payer: Self-pay | Admitting: General Practice

## 2014-08-17 ENCOUNTER — Other Ambulatory Visit (HOSPITAL_BASED_OUTPATIENT_CLINIC_OR_DEPARTMENT_OTHER): Payer: Medicare Other

## 2014-08-17 VITALS — BP 134/54 | HR 49 | Temp 98.1°F | Resp 17 | Ht 61.0 in | Wt 95.7 lb

## 2014-08-17 DIAGNOSIS — D473 Essential (hemorrhagic) thrombocythemia: Secondary | ICD-10-CM | POA: Diagnosis not present

## 2014-08-17 LAB — COMPREHENSIVE METABOLIC PANEL (CC13)
ALT: 7 U/L (ref 0–55)
AST: 16 U/L (ref 5–34)
Albumin: 3.1 g/dL — ABNORMAL LOW (ref 3.5–5.0)
Alkaline Phosphatase: 45 U/L (ref 40–150)
Anion Gap: 5 mEq/L (ref 3–11)
BUN: 25.8 mg/dL (ref 7.0–26.0)
CO2: 30 mEq/L — ABNORMAL HIGH (ref 22–29)
Calcium: 8.7 mg/dL (ref 8.4–10.4)
Chloride: 110 mEq/L — ABNORMAL HIGH (ref 98–109)
Creatinine: 0.7 mg/dL (ref 0.6–1.1)
Glucose: 94 mg/dl (ref 70–140)
Potassium: 4.2 mEq/L (ref 3.5–5.1)
SODIUM: 144 meq/L (ref 136–145)
Total Bilirubin: 0.55 mg/dL (ref 0.20–1.20)
Total Protein: 5.6 g/dL — ABNORMAL LOW (ref 6.4–8.3)

## 2014-08-17 LAB — CBC WITH DIFFERENTIAL/PLATELET
BASO%: 0.3 % (ref 0.0–2.0)
BASOS ABS: 0 10*3/uL (ref 0.0–0.1)
EOS%: 1 % (ref 0.0–7.0)
Eosinophils Absolute: 0.1 10*3/uL (ref 0.0–0.5)
HEMATOCRIT: 36.7 % (ref 34.8–46.6)
HEMOGLOBIN: 12 g/dL (ref 11.6–15.9)
LYMPH#: 0.8 10*3/uL — AB (ref 0.9–3.3)
LYMPH%: 11.2 % — AB (ref 14.0–49.7)
MCH: 42.3 pg — ABNORMAL HIGH (ref 25.1–34.0)
MCHC: 32.7 g/dL (ref 31.5–36.0)
MCV: 129.2 fL — AB (ref 79.5–101.0)
MONO#: 0.7 10*3/uL (ref 0.1–0.9)
MONO%: 9.9 % (ref 0.0–14.0)
NEUT#: 5.2 10*3/uL (ref 1.5–6.5)
NEUT%: 77.6 % — ABNORMAL HIGH (ref 38.4–76.8)
Platelets: 541 10*3/uL — ABNORMAL HIGH (ref 145–400)
RBC: 2.84 10*6/uL — ABNORMAL LOW (ref 3.70–5.45)
RDW: 13.4 % (ref 11.2–14.5)
WBC: 6.7 10*3/uL (ref 3.9–10.3)

## 2014-08-17 NOTE — Telephone Encounter (Signed)
[facility] PT APPT SCHEDULE FOR OCT °

## 2014-08-17 NOTE — Progress Notes (Signed)
Met Macala this morning through another pt and family who are her neighbors.  Provided pastoral presence, spiritual companionship, and witness to her story, visiting with her twice in the lobby.  She was very appreciative.  Chaplain  , MDiv [phone] 

## 2014-08-17 NOTE — Progress Notes (Signed)
Hematology and Oncology Follow Up Visit  [name] [name] [id number] July 11, 1923 80 y.o. [name], [name] Spare, MD  Chief Complaint: Essential Thrombocytosis.  Principle Diagnosis: Essential thrombocytosis, intitiated on Hydrea on 07/05/2011; JAK2 mutation analysis performed on 06/26/2011 was negative. Quantitative RT-PCR for BCR-ABL 1 performed on 07/07/2011 was not detected. Initially the patient was on Hydrea 1500 mg QOD and 1000 mg QOD, with significant drop in platelet count to 60,000 and neutropenia, adjusted to current dose of  Hydrea 500 mg ii po qod and i po qod, with adequate values.  Prior Therapy:  None  Current therapy:  Changed dose of Hydrea 1,000 mg daily . I added Aspirin 81 mg also for thromboprophylaxis.  Interim History:  [name] [name] returns to the office of the [facility] for a followup visit. She is overall in good health. She remains active. She denies any headaches, nausea, vomiting, shortness of breath, or cardiac complaints. No GI or GU complaints . Denies any bleeding issues, or mucositis. Her appetite is adequate. Had UTI about two months ago with complete resolution with antibiotics.  Arthritis in the hands is stable.  No hospitalizations or emergency room visits.  Dr Nyoka Cowden is here PCP and visits regularly at [facility].  Her appetite and weight is stable.   She is compliant to hydrea as evidence by her high MCV in blood. I will increase dose to 1000 mg daily and have her cbc repeated in 3 weeks. She did get her flu shot this year.  Medications: I have reviewed the patient's current medications.  Current Outpatient Prescriptions  Medication Sig Dispense Refill  . atenolol (TENORMIN) 50 MG tablet Take by mouth. Take 1/2 tablet twice daily for blood pressure      . calcitonin, salmon, (MIACALCIN/FORTICAL) 200 UNIT/ACT nasal spray USE 1 SQUIRT IN ALTERNATING NOSTRILS DAILY FOR BONES  3.7 mL  5  . Calcium-Vitamin D-Vitamin K (VIACTIV) 086-578-46 MG-UNT-MCG CHEW Chew 1 each by  mouth daily.      . dorzolamide-timolol (COSOPT) 22.3-6.8 MG/ML ophthalmic solution Place 1 drop into both eyes 2 (two) times daily.       . flurbiprofen (ANSAID) 100 MG tablet TAKE 1 TABLET BY MOUTH EVERY DAY  90 tablet  0  . Glucosamine-Chondroit-Vit C-Mn (GLUCOSAMINE CHONDR 1500 COMPLX) CAPS Take 2 capsules by mouth daily.       . hydroxyurea (HYDREA) 500 MG capsule TAKE 2 CAPSULES BY MOUTH EVERY OTHER DAY ALTERNATING WITH 500 MG OR AS DIRECTED  100 capsule  2  . latanoprost (XALATAN) 0.005 % ophthalmic solution Place 1 drop into both eyes daily.      . Magnesium 250 MG TABS Take by mouth. Take one tablet daily to help with constipation      . URELLE (URELLE/URISED) 81 MG TABS Take 1 tablet by mouth daily. Take one every other day      . vitamin E 400 UNIT capsule Take 400 Units by mouth every other day.       No current facility-administered medications for this visit.   Allergies:  Allergies  Allergen Reactions  . Darvocet [Propoxyphene N-Acetaminophen] Nausea And Vomiting  . Darvon Nausea And Vomiting  . Meperidine And Related Nausea And Vomiting  . Vicodin [Hydrocodone-Acetaminophen] Nausea And Vomiting   Past Medical History, Surgical history, Social history, and Family History were reviewed and updated.  Review of Systems: Constitutional:  Negative for fever, chills, night sweats, anorexia, weight loss, pain. Cardiovascular: no chest pain or dyspnea on exertion Respiratory: no cough, shortness of  breath, or wheezing Neurological: no TIA or stroke symptoms Dermatological: negative for rash ENT: negative for - epistaxis or headaches Skin: Negative. Gastrointestinal: no abdominal pain, change in bowel habits, or black or bloody stools Genito-Urinary: no dysuria, trouble voiding, or hematuria Hematological and Lymphatic: negative for - bleeding problems or blood clots Breast: negative for breast lumps Musculoskeletal: negative for - gait disturbance Remaining ROS  negative. Physical Exam: Blood pressure 134/54, pulse 49, temperature 98.1 F (36.7 C), temperature source Oral, resp. rate 17, height 5\' 1"  (1.549 m), weight 95 lb 11.2 oz (43.409 kg), SpO2 98.00%. ECOG: 0 General appearance: alert, cooperative, appears stated age and no distress Head: Normocephalic, without obvious abnormality, atraumatic Neck: no adenopathy, supple, symmetrical, trachea midline and thyroid not enlarged, symmetric, no tenderness/mass/nodules HEENT: OP clear; PERRL; EOMi.  Lymph nodes: Cervical, supraclavicular, and axillary nodes normal. Heart:regular rate and rhythm, S1, S2 normal, no murmur, click, rub or gallop Lung:chest clear, no wheezing, rales, normal symmetric air entry Abdomin: soft, non-tender, without masses or organomegaly EXT:No peripheral edema   Lab Results: Lab Results  Component Value Date   WBC 6.7 08/17/2014   HGB 12.0 08/17/2014   HCT 36.7 08/17/2014   MCV 129.2* 08/17/2014   PLT 541* 08/17/2014     Chemistry      Component Value Date/Time   NA 144 08/17/2014 1019   NA 139 06/29/2013 1119   NA 140 06/24/2012 0902   K 4.2 08/17/2014 1019   K 4.1 06/29/2013 1119   CL 101 06/29/2013 1119   CO2 30* 08/17/2014 1019   CO2 26 06/29/2013 1119   BUN 25.8 08/17/2014 1019   BUN 10 06/29/2013 1119   BUN 19 06/24/2012 0902   CREATININE 0.7 08/17/2014 1019   CREATININE 0.61 06/29/2013 1119      Component Value Date/Time   CALCIUM 8.7 08/17/2014 1019   CALCIUM 8.6 06/29/2013 1119   ALKPHOS 45 08/17/2014 1019   ALKPHOS 54 06/26/2011 1448   AST 16 08/17/2014 1019   AST 22 06/26/2011 1448   ALT 7 08/17/2014 1019   ALT 12 06/26/2011 1448   BILITOT 0.55 08/17/2014 1019   BILITOT 0.4 06/26/2011 1448       Impression and Plan:  1. Thrombocytosis.  -- [name] [name] is an 80 year old woman with essential thrombocytosis, currently on Hydrea, tolerating this current dosage well. Her Chemisitries are normal. MCV high meaning she is compliant.   -- We reviewed her labs, her  plts of 541.  We will repeat her CBC in three weeks and see if the hydrea 1 gram dose is effective or not?   -- She will return in 3 weeks for a followup visit with at which time CBC will be drawn.  Time spent with patient: 20 minutes   [place], MD Medical Hematologist/Oncologist [place] Pager: [phone] Office No: [phone]

## 2014-09-07 ENCOUNTER — Other Ambulatory Visit (HOSPITAL_BASED_OUTPATIENT_CLINIC_OR_DEPARTMENT_OTHER): Payer: Medicare Other

## 2014-09-07 ENCOUNTER — Telehealth: Payer: Self-pay | Admitting: Hematology

## 2014-09-07 ENCOUNTER — Ambulatory Visit (HOSPITAL_BASED_OUTPATIENT_CLINIC_OR_DEPARTMENT_OTHER): Payer: Medicare Other | Admitting: Hematology

## 2014-09-07 ENCOUNTER — Other Ambulatory Visit: Payer: Self-pay | Admitting: Internal Medicine

## 2014-09-07 VITALS — BP 136/57 | HR 55 | Temp 98.2°F | Resp 17 | Ht 61.0 in | Wt 96.3 lb

## 2014-09-07 DIAGNOSIS — D473 Essential (hemorrhagic) thrombocythemia: Secondary | ICD-10-CM | POA: Diagnosis not present

## 2014-09-07 LAB — CBC WITH DIFFERENTIAL/PLATELET
BASO%: 0.4 % (ref 0.0–2.0)
BASOS ABS: 0 10*3/uL (ref 0.0–0.1)
EOS%: 1.2 % (ref 0.0–7.0)
Eosinophils Absolute: 0 10*3/uL (ref 0.0–0.5)
HCT: 33.4 % — ABNORMAL LOW (ref 34.8–46.6)
HGB: 11.1 g/dL — ABNORMAL LOW (ref 11.6–15.9)
LYMPH%: 29 % (ref 14.0–49.7)
MCH: 43.2 pg — ABNORMAL HIGH (ref 25.1–34.0)
MCHC: 33.2 g/dL (ref 31.5–36.0)
MCV: 130 fL — ABNORMAL HIGH (ref 79.5–101.0)
MONO#: 0.3 10*3/uL (ref 0.1–0.9)
MONO%: 9.7 % (ref 0.0–14.0)
NEUT%: 59.7 % (ref 38.4–76.8)
NEUTROS ABS: 1.6 10*3/uL (ref 1.5–6.5)
Platelets: 337 10*3/uL (ref 145–400)
RBC: 2.57 10*6/uL — AB (ref 3.70–5.45)
RDW: 14.3 % (ref 11.2–14.5)
WBC: 2.6 10*3/uL — ABNORMAL LOW (ref 3.9–10.3)
lymph#: 0.8 10*3/uL — ABNORMAL LOW (ref 0.9–3.3)

## 2014-09-07 NOTE — Telephone Encounter (Signed)
Gave avs & cal for Nov.  °

## 2014-09-07 NOTE — Progress Notes (Signed)
Hematology and Oncology Follow Up Visit Date of Visit: 09/07/2014  [name] [name] [id number] [birth year] 80 y.o. GREEN, [name] Spare, MD  Chief Complaint: Essential Thrombocytosis.  Principle Diagnosis: Essential thrombocytosis, intitiated on Hydrea on 07/05/2011; JAK2 mutation analysis performed on 06/26/2011 was negative. Quantitative RT-PCR for BCR-ABL 1 performed on 07/07/2011 was not detected. Initially the patient was on Hydrea 1500 mg QOD and 1000 mg QOD, with significant drop in platelet count to 60,000 and neutropenia, adjusted to current dose of  Hydrea 500 mg ii po qod and i po qod, with adequate values.  Current therapy:  Changed dose of Hydrea today to 1,000 mg 5 days Monday through Friday and 500 mg on Weekends Saturday/Sunday. Aspirin 81 mg also for thromboprophylaxis.  Interim History:  [name] [name] returns to the office of the [facility] for a followup visit. She is overall in good health. She remains active. She denies any headaches, nausea, vomiting, shortness of breath, or cardiac complaints. No GI or GU complaints . Denies any bleeding issues, or mucositis. Her appetite is adequate. Had UTI about two months ago with complete resolution with antibiotics.  Arthritis in the hands is stable.  No hospitalizations or emergency room visits.  Dr Nyoka Cowden is here PCP and visits [facility] at [facility].  Her appetite and weight is stable.   She is compliant to hydrea as evidence by her high MCV in blood. Based on her CBC, Platelet response is excellent but I will cut down dose little bit because of the anemia and leukopenia. We will check her CBC in 3 weeks. She did get her flu shot this year.  Medications: I have reviewed the patient's current medications.  Current Outpatient Prescriptions  Medication Sig Dispense Refill  . aspirin 81 MG tablet Take 81 mg by mouth daily.      Marland Kitchen atenolol (TENORMIN) 50 MG tablet Take by mouth. Take 1/2 tablet twice daily for blood pressure      . calcitonin,  salmon, (MIACALCIN/FORTICAL) 200 UNIT/ACT nasal spray USE 1 SQUIRT IN ALTERNATING NOSTRILS DAILY FOR BONES  3.7 mL  5  . Calcium-Vitamin D-Vitamin K (VIACTIV) 366-440-34 MG-UNT-MCG CHEW Chew 1 each by mouth daily.      . dorzolamide-timolol (COSOPT) 22.3-6.8 MG/ML ophthalmic solution Place 1 drop into both eyes 2 (two) times daily.       . flurbiprofen (ANSAID) 100 MG tablet TAKE 1 TABLET BY MOUTH EVERY DAY  90 tablet  0  . Glucosamine-Chondroit-Vit C-Mn (GLUCOSAMINE CHONDR 1500 COMPLX) CAPS Take 2 capsules by mouth daily.       . hydroxyurea (HYDREA) 500 MG capsule take 2 capsules by mouth Monday through Thursday 5 days and 1 capsule on sat/sunday (weekend)      . latanoprost (XALATAN) 0.005 % ophthalmic solution Place 1 drop into both eyes daily.      . Magnesium 250 MG TABS Take by mouth. Take one tablet daily to help with constipation      . URELLE (URELLE/URISED) 81 MG TABS Take 1 tablet by mouth daily. Take one every other day      . vitamin E 400 UNIT capsule Take 400 Units by mouth every other day.       No current facility-administered medications for this visit.   Allergies:  Allergies  Allergen Reactions  . Darvocet [Propoxyphene N-Acetaminophen] Nausea And Vomiting  . Darvon Nausea And Vomiting  . Meperidine And Related Nausea And Vomiting  . Vicodin [Hydrocodone-Acetaminophen] Nausea And Vomiting   Past Medical History, Surgical history,  Social history, and Family History were reviewed and updated.  Review of Systems: Constitutional:  Negative for fever, chills, night sweats, anorexia, weight loss, pain. Cardiovascular: no chest pain or dyspnea on exertion Respiratory: no cough, shortness of breath, or wheezing Neurological: no TIA or stroke symptoms Dermatological: negative for rash ENT: negative for - epistaxis or headaches Skin: Negative. Gastrointestinal: no abdominal pain, change in bowel habits, or black or bloody stools Genito-Urinary: no dysuria, trouble voiding,  or hematuria Hematological and Lymphatic: negative for - bleeding problems or blood clots Breast: negative for breast lumps Musculoskeletal: negative for - gait disturbance Remaining ROS negative.  Physical Exam: Blood pressure 136/57, pulse 55, temperature 98.2 F (36.8 C), temperature source Oral, resp. rate 17, height 5\' 1"  (1.549 m), weight 96 lb 4.8 oz (43.681 kg), SpO2 98.00%. ECOG: 0 General appearance: alert, cooperative, appears stated age and no distress Head: Normocephalic, without obvious abnormality, atraumatic Neck: no adenopathy, supple, symmetrical, trachea midline and thyroid not enlarged, symmetric, no tenderness/mass/nodules HEENT: OP clear; PERRL; EOMi.  Lymph nodes: Cervical, supraclavicular, and axillary nodes normal. Heart:regular rate and rhythm, S1, S2 normal, no murmur, click, rub or gallop Lung:chest clear, no wheezing, rales, normal symmetric air entry Abdomin: soft, non-tender, without masses or organomegaly EXT:No peripheral edema   Lab Results: Lab Results  Component Value Date   WBC 2.6* 09/07/2014   HGB 11.1* 09/07/2014   HCT 33.4* 09/07/2014   MCV 130.0* 09/07/2014   PLT 337 09/07/2014     Chemistry      Component Value Date/Time   NA 144 08/17/2014 1019   NA 139 06/29/2013 1119   NA 140 06/24/2012 0902   K 4.2 08/17/2014 1019   K 4.1 06/29/2013 1119   CL 101 06/29/2013 1119   CO2 30* 08/17/2014 1019   CO2 26 06/29/2013 1119   BUN 25.8 08/17/2014 1019   BUN 10 06/29/2013 1119   BUN 19 06/24/2012 0902   CREATININE 0.7 08/17/2014 1019   CREATININE 0.61 06/29/2013 1119      Component Value Date/Time   CALCIUM 8.7 08/17/2014 1019   CALCIUM 8.6 06/29/2013 1119   ALKPHOS 45 08/17/2014 1019   ALKPHOS 54 06/26/2011 1448   AST 16 08/17/2014 1019   AST 22 06/26/2011 1448   ALT 7 08/17/2014 1019   ALT 12 06/26/2011 1448   BILITOT 0.55 08/17/2014 1019   BILITOT 0.4 06/26/2011 1448       Impression and Plan:  1. Thrombocytosis.  -- [name] [name] is an  80 year old woman with essential thrombocytosis, currently on Hydrea, tolerating this current dosage well. MCV high meaning she is compliant.   -- We reviewed her labs, her plts of 337. White count is 2.6 and ANC 1600, Hemoglobin 11.1 grams. I will repeat her CBC in three weeks and see if the modified dose of hydrea 1 gram 5 days and 500 mg on weekends is effective or not?   -- She will return in 3 weeks for a followup visit with at which time CBC will be drawn.  Time spent with patient: 15 minutes   [place], MD Medical Hematologist/Oncologist [place] Pager: [phone] Office No: [phone]

## 2014-09-10 ENCOUNTER — Other Ambulatory Visit: Payer: Self-pay | Admitting: Internal Medicine

## 2014-09-21 ENCOUNTER — Telehealth: Payer: Self-pay

## 2014-09-21 NOTE — Telephone Encounter (Signed)
Pt called asking her labs be faxed to Dr Maudie Flakes [facility]. His office is requiring them. The office was closed. Left message for desk nurse to fax on Monday.

## 2014-09-24 ENCOUNTER — Telehealth: Payer: Self-pay

## 2014-09-24 NOTE — Telephone Encounter (Signed)
Labs faxed to [facility] per pt's request

## 2014-09-28 ENCOUNTER — Telehealth: Payer: Self-pay | Admitting: Hematology

## 2014-09-28 ENCOUNTER — Ambulatory Visit (HOSPITAL_BASED_OUTPATIENT_CLINIC_OR_DEPARTMENT_OTHER): Payer: Medicare Other | Admitting: Nurse Practitioner

## 2014-09-28 ENCOUNTER — Encounter: Payer: Self-pay | Admitting: Nurse Practitioner

## 2014-09-28 ENCOUNTER — Other Ambulatory Visit (HOSPITAL_BASED_OUTPATIENT_CLINIC_OR_DEPARTMENT_OTHER): Payer: Medicare Other

## 2014-09-28 VITALS — BP 140/59 | HR 56 | Temp 98.0°F | Resp 18 | Ht 61.0 in | Wt 94.8 lb

## 2014-09-28 DIAGNOSIS — D473 Essential (hemorrhagic) thrombocythemia: Secondary | ICD-10-CM | POA: Diagnosis not present

## 2014-09-28 LAB — CBC WITH DIFFERENTIAL/PLATELET
BASO%: 0.3 % (ref 0.0–2.0)
Basophils Absolute: 0 10*3/uL (ref 0.0–0.1)
EOS%: 0.6 % (ref 0.0–7.0)
Eosinophils Absolute: 0 10*3/uL (ref 0.0–0.5)
HCT: 34.1 % — ABNORMAL LOW (ref 34.8–46.6)
HGB: 11.5 g/dL — ABNORMAL LOW (ref 11.6–15.9)
LYMPH%: 21.1 % (ref 14.0–49.7)
MCH: 44.6 pg — AB (ref 25.1–34.0)
MCHC: 33.7 g/dL (ref 31.5–36.0)
MCV: 132.2 fL — ABNORMAL HIGH (ref 79.5–101.0)
MONO#: 0.3 10*3/uL (ref 0.1–0.9)
MONO%: 9.3 % (ref 0.0–14.0)
NEUT#: 2.2 10*3/uL (ref 1.5–6.5)
NEUT%: 68.7 % (ref 38.4–76.8)
Platelets: 343 10*3/uL (ref 145–400)
RBC: 2.58 10*6/uL — AB (ref 3.70–5.45)
RDW: 14.6 % — ABNORMAL HIGH (ref 11.2–14.5)
WBC: 3.2 10*3/uL — AB (ref 3.9–10.3)
lymph#: 0.7 10*3/uL — ABNORMAL LOW (ref 0.9–3.3)

## 2014-09-28 LAB — BASIC METABOLIC PANEL (CC13)
ANION GAP: 9 meq/L (ref 3–11)
BUN: 18.5 mg/dL (ref 7.0–26.0)
CALCIUM: 8.4 mg/dL (ref 8.4–10.4)
CO2: 27 mEq/L (ref 22–29)
Chloride: 108 mEq/L (ref 98–109)
Creatinine: 0.8 mg/dL (ref 0.6–1.1)
Glucose: 93 mg/dl (ref 70–140)
Potassium: 3.7 mEq/L (ref 3.5–5.1)
SODIUM: 144 meq/L (ref 136–145)

## 2014-09-28 NOTE — Progress Notes (Signed)
will   SYMPTOM MANAGEMENT CLINIC   HPI: [name] [name] 80 y.o. female diagnosed with essential thrombocytosis.  Currently undergoing Hydrea therapy.  Patient presents to the [facility] today for followup of her essential part for psychosis.  She recently had her Hydrea dosing decreased to Hydrea 1000 mg on a Monday through Friday basis; and Hydrea 500 mg on Saturdays and Sundays.  She denies any new symptoms whatsoever.  She states her energy level and her appetite remains stable.  She denies any recent fevers or chills.   HPI   ROS  Past Medical History  Diagnosis Date  . Glaucoma   . Hypertension   . Arthritis   . Neck fracture   . Abdominal pain, unspecified site 08/11/2012  . Essential thrombocythemia 06/18/2011  . Anemia, unspecified 06/18/2011  . 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 Loss of weight 06/18/2011  . Lumbago 12/18/2010  . Sciatica 06/19/2010  . Memory loss 12/19/2009  . Diverticulosis of colon (without mention of hemorrhage) 04/20/2007  . Disorder of bone and cartilage, unspecified 04/20/2007  . Hemorrhage of gastrointestinal tract, unspecified 03/09/2007  . Other and unspecified hyperlipidemia 09/01/2006  . BBB (bundle branch block) 03/03/2006  . Cardiac dysrhythmia, unspecified 03/03/2006  . Chronic interstitial cystitis 03/03/2006  . Osteoarthrosis, unspecified whether generalized or localized, unspecified site 03/03/2006  . Hematuria, unspecified 04/22/2004  . Herpes zoster without mention of complication 4/33/2951  . 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 Appendectomy    . Tonsillectomy    . Abdominal hysterectomy    . Cataract extraction w/ intraocular lens  implant, bilateral    . Breast cyst excision Bilateral     benign    has Glaucoma; Thrombocytosis; Hypertension; Anemia, unspecified; Loss of weight; Memory loss; Hyperlipidemia; Hoarse; and Essential thrombocythemia on her problem list.     is  allergic to darvocet; darvon; meperidine and related; and vicodin.    Medication List       This list is accurate as of: 09/28/14  4:37 PM.  Always use your most recent med list.               aspirin 81 MG tablet  Take 81 mg by mouth daily.     atenolol 50 MG tablet  Commonly known as:  TENORMIN  Take by mouth. Take 1/2 tablet twice daily for blood pressure     calcitonin (salmon) 200 UNIT/ACT nasal spray  Commonly known as:  MIACALCIN/FORTICAL  USE 1 SQUIRT IN ALTERNATING NOSTRILS DAILY FOR BONES     dorzolamide-timolol 22.3-6.8 MG/ML ophthalmic solution  Commonly known as:  COSOPT  Place 1 drop into both eyes 2 (two) times daily.     flurbiprofen 100 MG tablet  Commonly known as:  ANSAID  TAKE 1 TABLET BY MOUTH EVERY DAY     GLUCOSAMINE CHONDR 1500 COMPLX Caps  Take 2 capsules by mouth daily.     hydroxyurea 500 MG capsule  Commonly known as:  HYDREA  take 2 capsules by mouth Monday through Thursday 5 days and 1 capsule on sat/sunday (weekend)     latanoprost 0.005 % ophthalmic solution  Commonly known as:  XALATAN  Place 1 drop into both eyes daily.     Magnesium 250 MG Tabs  Take by mouth. Take one tablet daily to help with constipation     URELLE 81 MG Tabs tablet  Take 1 tablet by mouth daily. Take one every other day  VIACTIV 595-638-75 MG-UNT-MCG Chew  Generic drug:  Calcium-Vitamin D-Vitamin K  Chew 1 each by mouth daily.     vitamin E 400 UNIT capsule  Take 400 Units by mouth every other day.         PHYSICAL EXAMINATION  Blood pressure 140/59, pulse 56, temperature 98 F (36.7 C), temperature source Oral, resp. rate 18, height 5\' 1"  (1.549 m), weight 94 lb 12.8 oz (43.001 kg), SpO2 98 %.  Physical Exam  Constitutional: She is oriented to person, place, and time and well-developed, well-nourished, and in no distress.  HENT:  Head: Normocephalic and atraumatic.  Mouth/Throat: Oropharynx is clear and moist.  Eyes: Conjunctivae and EOM  are normal. Pupils are equal, round, and reactive to light. Right eye exhibits no discharge. Left eye exhibits no discharge. No scleral icterus.  Neck: Normal range of motion. Neck supple. No JVD present. No tracheal deviation present. No thyromegaly present.  Cardiovascular: Normal rate, regular rhythm, normal heart sounds and intact distal pulses.   Pulmonary/Chest: Effort normal and breath sounds normal. No respiratory distress. She has no wheezes. She has no rales.  Abdominal: Soft. Bowel sounds are normal. She exhibits no distension and no mass. There is no tenderness. There is no rebound and no guarding.  Musculoskeletal: Normal range of motion. She exhibits no edema or tenderness.  Lymphadenopathy:    She has no cervical adenopathy.  Neurological: She is alert and oriented to person, place, and time. Gait normal.  Skin: Skin is warm and dry. No rash noted. No erythema.  Psychiatric: Affect normal.  Nursing note and vitals reviewed.   LABORATORY DATA:. Appointment on 09/28/2014  Component Date Value Ref Range Status  . WBC 09/28/2014 3.2* 3.9 - 10.3 10e3/uL Final  . NEUT# 09/28/2014 2.2  1.5 - 6.5 10e3/uL Final  . HGB 09/28/2014 11.5* 11.6 - 15.9 g/dL Final  . HCT 09/28/2014 34.1* 34.8 - 46.6 % Final  . Platelets 09/28/2014 343  145 - 400 10e3/uL Final  . MCV 09/28/2014 132.2* 79.5 - 101.0 fL Final  . MCH 09/28/2014 44.6* 25.1 - 34.0 pg Final  . MCHC 09/28/2014 33.7  31.5 - 36.0 g/dL Final  . RBC 09/28/2014 2.58* 3.70 - 5.45 10e6/uL Final  . RDW 09/28/2014 14.6* 11.2 - 14.5 % Final  . lymph# 09/28/2014 0.7* 0.9 - 3.3 10e3/uL Final  . MONO# 09/28/2014 0.3  0.1 - 0.9 10e3/uL Final  . Eosinophils Absolute 09/28/2014 0.0  0.0 - 0.5 10e3/uL Final  . Basophils Absolute 09/28/2014 0.0  0.0 - 0.1 10e3/uL Final  . NEUT% 09/28/2014 68.7  38.4 - 76.8 % Final  . LYMPH% 09/28/2014 21.1  14.0 - 49.7 % Final  . MONO% 09/28/2014 9.3  0.0 - 14.0 % Final  . EOS% 09/28/2014 0.6  0.0 - 7.0 %  Final  . BASO% 09/28/2014 0.3  0.0 - 2.0 % Final  . Sodium 09/28/2014 144  136 - 145 mEq/L Final  . Potassium 09/28/2014 3.7  3.5 - 5.1 mEq/L Final  . Chloride 09/28/2014 108  98 - 109 mEq/L Final  . CO2 09/28/2014 27  22 - 29 mEq/L Final  . Glucose 09/28/2014 93  70 - 140 mg/dl Final  . BUN 09/28/2014 18.5  7.0 - 26.0 mg/dL Final  . Creatinine 09/28/2014 0.8  0.6 - 1.1 mg/dL Final  . Calcium 09/28/2014 8.4  8.4 - 10.4 mg/dL Final  . Anion Gap 09/28/2014 9  3 - 11 mEq/L Final     RADIOGRAPHIC STUDIES: No  results found.  ASSESSMENT/PLAN:    Essential thrombocythemia Patient presents for followup of her essential psychosis.  Patient's Hydrea was decreased to 1000 mg on a Monday through Friday basis; and 500 mg daily on Saturdays and Sundays.  Blood count obtained today remained essentially stable.  Patient continues with no symptoms whatsoever.  She denies any recent fevers or chills.  Patient will continue at current Hydrea dosing; and return to the [facility] for follow up labs and visit in approximately 4 weeks.  Advised patient that we would call her with new appointment time.  She was encouraged to call in the interim if she has any new worries or concerns.   Patient stated understanding of all instructions; and was in agreement with this plan of care. The patient knows to call the clinic with any problems, questions or concerns.   Review/collaboration with Dr. Lona Kettle regarding all aspects of patient's visit today.   Total time spent with patient was 25 minutes;  with greater than 80 percent of that time spent in face to face counseling regarding her symptoms, review of her lab results, and coordination of care and follow up.  Disclaimer: This note was dictated with voice recognition software. Similar sounding words can inadvertently be transcribed and may not be corrected upon review.   Drue Second, NP 09/28/2014

## 2014-09-28 NOTE — Telephone Encounter (Signed)
Gave avs  ° °

## 2014-09-28 NOTE — Assessment & Plan Note (Signed)
Patient presents for followup of her essential psychosis.  Patient's Hydrea was decreased to 1000 mg on a Monday through Friday basis; and 500 mg daily on Saturdays and Sundays.  Blood count obtained today remained essentially stable.  Patient continues with no symptoms whatsoever.  She denies any recent fevers or chills.  Patient will continue at current Hydrea dosing; and return to the [facility] for follow up labs and visit in approximately 4 weeks.  Advised patient that we would call her with new appointment time.  She was encouraged to call in the interim if she has any new worries or concerns.

## 2014-10-01 ENCOUNTER — Telehealth: Payer: Self-pay

## 2014-10-01 ENCOUNTER — Telehealth: Payer: Self-pay | Admitting: Hematology

## 2014-10-01 NOTE — Telephone Encounter (Signed)
Pt is upset she did not get a call Friday for her f/u appt. She saw Granville Lewis on Friday. The pof states in approx 4 weeks. In basket sent to [facility].

## 2014-10-01 NOTE — Telephone Encounter (Signed)
Noted appt made and pt called

## 2014-10-01 NOTE — Telephone Encounter (Signed)
LM to confirm appt d/t. Mailed cal

## 2014-10-02 DIAGNOSIS — H4011X1 Primary open-angle glaucoma, mild stage: Secondary | ICD-10-CM | POA: Diagnosis not present

## 2014-10-02 DIAGNOSIS — H04123 Dry eye syndrome of bilateral lacrimal glands: Secondary | ICD-10-CM | POA: Diagnosis not present

## 2014-10-02 DIAGNOSIS — H26492 Other secondary cataract, left eye: Secondary | ICD-10-CM | POA: Diagnosis not present

## 2014-10-02 DIAGNOSIS — H1851 Endothelial corneal dystrophy: Secondary | ICD-10-CM | POA: Diagnosis not present

## 2014-10-02 DIAGNOSIS — H4011X3 Primary open-angle glaucoma, severe stage: Secondary | ICD-10-CM | POA: Diagnosis not present

## 2014-10-02 DIAGNOSIS — Z961 Presence of intraocular lens: Secondary | ICD-10-CM | POA: Diagnosis not present

## 2014-10-11 DIAGNOSIS — R4182 Altered mental status, unspecified: Secondary | ICD-10-CM | POA: Diagnosis not present

## 2014-10-11 DIAGNOSIS — R009 Unspecified abnormalities of heart beat: Secondary | ICD-10-CM | POA: Diagnosis not present

## 2014-10-11 LAB — LIPID PANEL
CHOLESTEROL: 163 mg/dL (ref 0–200)
HDL: 53 mg/dL (ref 35–70)
LDL CALC: 13 mg/dL
LDl/HDL Ratio: 3.1
Triglycerides: 63 mg/dL (ref 40–160)

## 2014-10-11 LAB — BASIC METABOLIC PANEL
BUN: 19 mg/dL (ref 4–21)
Creatinine: 0.7 mg/dL (ref 0.5–1.1)
Glucose: 78 mg/dL
POTASSIUM: 4.4 mmol/L (ref 3.4–5.3)
Sodium: 141 mmol/L (ref 137–147)

## 2014-10-11 LAB — HEPATIC FUNCTION PANEL
ALK PHOS: 41 U/L (ref 25–125)
ALT: 8 U/L (ref 7–35)
AST: 13 U/L (ref 13–35)
BILIRUBIN, TOTAL: 0.5 mg/dL

## 2014-10-18 ENCOUNTER — Encounter: Payer: Self-pay | Admitting: Internal Medicine

## 2014-10-18 ENCOUNTER — Non-Acute Institutional Stay: Payer: Medicare Other | Admitting: Internal Medicine

## 2014-10-18 VITALS — BP 132/58 | HR 60 | Temp 97.5°F | Wt 94.0 lb

## 2014-10-18 DIAGNOSIS — E785 Hyperlipidemia, unspecified: Secondary | ICD-10-CM | POA: Diagnosis not present

## 2014-10-18 DIAGNOSIS — R413 Other amnesia: Secondary | ICD-10-CM | POA: Diagnosis not present

## 2014-10-18 DIAGNOSIS — R634 Abnormal weight loss: Secondary | ICD-10-CM | POA: Diagnosis not present

## 2014-10-18 DIAGNOSIS — D649 Anemia, unspecified: Secondary | ICD-10-CM | POA: Diagnosis not present

## 2014-10-18 DIAGNOSIS — D473 Essential (hemorrhagic) thrombocythemia: Secondary | ICD-10-CM | POA: Diagnosis not present

## 2014-10-18 DIAGNOSIS — I10 Essential (primary) hypertension: Secondary | ICD-10-CM | POA: Diagnosis not present

## 2014-10-18 NOTE — Progress Notes (Signed)
Patient ID: [name] [name], female   DOB: September 18, 1923, 80 y.o.   MRN: [id number]    [facility]    Place of Service: Clinic (12) OFFICE   Allergies  Allergen Reactions  . Darvocet [Propoxyphene N-Acetaminophen] Nausea And Vomiting  . Darvon Nausea And Vomiting  . Meperidine And Related Nausea And Vomiting  . Vicodin [Hydrocodone-Acetaminophen] Nausea And Vomiting    Chief Complaint  Patient presents with  . Medical Management of Chronic Issues    blood pressure, cholesterol, anemia    HPI:  Essential hypertension: controlled  Hyperlipidemia: controlled  Anemia, unspecified anemia type: remains anemic.   Memory loss: stable  Essential thrombocythemia: normal platelets count in Nov 2015  Loss of weight: appetite is OK. Lost 2 ponds since last visit.    Medications: Patient's Medications  New Prescriptions   No medications on file  Previous Medications   ASPIRIN 81 MG TABLET    Take 81 mg by mouth daily.   ATENOLOL (TENORMIN) 50 MG TABLET    Take by mouth. Take 1/2 tablet twice daily for blood pressure   CALCITONIN, SALMON, (MIACALCIN/FORTICAL) 200 UNIT/ACT NASAL SPRAY    USE 1 SQUIRT IN ALTERNATING NOSTRILS DAILY FOR BONES   CALCIUM-VITAMIN D-VITAMIN K (VIACTIV) 131-438-88 MG-UNT-MCG CHEW    Chew 1 each by mouth daily.   DORZOLAMIDE-TIMOLOL (COSOPT) 22.3-6.8 MG/ML OPHTHALMIC SOLUTION    Place 1 drop into both eyes 2 (two) times daily.    FLURBIPROFEN (ANSAID) 100 MG TABLET    TAKE 1 TABLET BY MOUTH EVERY DAY   GLUCOSAMINE-CHONDROIT-VIT C-MN (GLUCOSAMINE CHONDR 1500 COMPLX) CAPS    Take 2 capsules by mouth daily.    HYDROXYUREA (HYDREA) 500 MG CAPSULE    take 2 capsules by mouth Monday through Thursday 5 days and 1 capsule on sat/sunday (weekend)   LATANOPROST (XALATAN) 0.005 % OPHTHALMIC SOLUTION    Place 1 drop into both eyes daily.   MAGNESIUM 250 MG TABS    Take by mouth. Take one tablet daily to help with constipation   URELLE (URELLE/URISED)  81 MG TABS    Take 1 tablet by mouth daily. Take one every other day   VITAMIN E 400 UNIT CAPSULE    Take 400 Units by mouth every other day.  Modified Medications   No medications on file  Discontinued Medications   No medications on file     Review of Systems  Constitutional: Negative for fever, diaphoresis, activity change, appetite change, fatigue and unexpected weight change.  HENT: Positive for voice change (hoarse since September 2014).   Eyes: Negative.   Respiratory: Negative.   Cardiovascular: Negative.   Gastrointestinal: Negative.   Endocrine: Negative.   Genitourinary: Negative.   Musculoskeletal: Negative.   Neurological: Negative.   Hematological:       Thrombocythemia  Psychiatric/Behavioral: Negative.     Filed Vitals:   10/18/14 1413  BP: 132/58  Pulse: 60  Temp: 97.5 F (36.4 C)  TempSrc: Oral  Weight: 94 lb (42.638 kg)   Body mass index is 17.77 kg/(m^2).  Physical Exam  Constitutional: She is oriented to person, place, and time. She appears well-developed and well-nourished. No distress.  HENT:  Head: Normocephalic and atraumatic.  Right Ear: External ear normal.  Left Ear: External ear normal.  Nose: Nose normal.  Mouth/Throat: Oropharynx is clear and moist.  Eyes: Conjunctivae and EOM are normal. Pupils are equal, round, and reactive to light.  Corrective lenses  Neck: Neck supple. No JVD present. No tracheal  deviation present. No thyromegaly present.  Cardiovascular: Normal rate and regular rhythm.  Exam reveals no gallop and no friction rub.   Murmur (2/6/LSB SEM) heard. Pulmonary/Chest: No respiratory distress. She has no wheezes. She has no rales.  Abdominal: She exhibits no distension and no mass. There is no tenderness.  Musculoskeletal: She exhibits no edema or tenderness.  Heberden's nodes. Bouchard's nodes. Contracture of the right hand.  Lymphadenopathy:    She has no cervical adenopathy.  Neurological: She is alert and oriented  to person, place, and time. She has normal reflexes. No cranial nerve deficit. Coordination normal.  Skin: No rash noted. No erythema. No pallor.  Psychiatric: She has a normal mood and affect. Her behavior is normal. Judgment and thought content normal.     Labs reviewed: Nursing Home on 10/18/2014  Component Date Value Ref Range Status  . Glucose 10/11/2014 78   Final  . BUN 10/11/2014 19  4 - 21 mg/dL Final  . Creatinine 10/11/2014 0.7  0.5 - 1.1 mg/dL Final  . Potassium 10/11/2014 4.4  3.4 - 5.3 mmol/L Final  . Sodium 10/11/2014 141  137 - 147 mmol/L Final  . LDl/HDL Ratio 10/11/2014 3.1   Final  . Triglycerides 10/11/2014 63  40 - 160 mg/dL Final  . Cholesterol 10/11/2014 163  0 - 200 mg/dL Final  . HDL 10/11/2014 53  35 - 70 mg/dL Final  . LDL Cholesterol 10/11/2014 13   Final  . Alkaline Phosphatase 10/11/2014 41  25 - 125 U/L Final  . ALT 10/11/2014 8  7 - 35 U/L Final  . AST 10/11/2014 13  13 - 35 U/L Final  . Bilirubin, Total 10/11/2014 0.5   Final  Appointment on 09/28/2014  Component Date Value Ref Range Status  . WBC 09/28/2014 3.2* 3.9 - 10.3 10e3/uL Final  . NEUT# 09/28/2014 2.2  1.5 - 6.5 10e3/uL Final  . HGB 09/28/2014 11.5* 11.6 - 15.9 g/dL Final  . HCT 09/28/2014 34.1* 34.8 - 46.6 % Final  . Platelets 09/28/2014 343  145 - 400 10e3/uL Final  . MCV 09/28/2014 132.2* 79.5 - 101.0 fL Final  . MCH 09/28/2014 44.6* 25.1 - 34.0 pg Final  . MCHC 09/28/2014 33.7  31.5 - 36.0 g/dL Final  . RBC 09/28/2014 2.58* 3.70 - 5.45 10e6/uL Final  . RDW 09/28/2014 14.6* 11.2 - 14.5 % Final  . lymph# 09/28/2014 0.7* 0.9 - 3.3 10e3/uL Final  . MONO# 09/28/2014 0.3  0.1 - 0.9 10e3/uL Final  . Eosinophils Absolute 09/28/2014 0.0  0.0 - 0.5 10e3/uL Final  . Basophils Absolute 09/28/2014 0.0  0.0 - 0.1 10e3/uL Final  . NEUT% 09/28/2014 68.7  38.4 - 76.8 % Final  . LYMPH% 09/28/2014 21.1  14.0 - 49.7 % Final  . MONO% 09/28/2014 9.3  0.0 - 14.0 % Final  . EOS% 09/28/2014 0.6  0.0 -  7.0 % Final  . BASO% 09/28/2014 0.3  0.0 - 2.0 % Final  . Sodium 09/28/2014 144  136 - 145 mEq/L Final  . Potassium 09/28/2014 3.7  3.5 - 5.1 mEq/L Final  . Chloride 09/28/2014 108  98 - 109 mEq/L Final  . CO2 09/28/2014 27  22 - 29 mEq/L Final  . Glucose 09/28/2014 93  70 - 140 mg/dl Final  . BUN 09/28/2014 18.5  7.0 - 26.0 mg/dL Final  . Creatinine 09/28/2014 0.8  0.6 - 1.1 mg/dL Final  . Calcium 09/28/2014 8.4  8.4 - 10.4 mg/dL Final  . Anion Gap  09/28/2014 9  3 - 11 mEq/L Final  Appointment on 09/07/2014  Component Date Value Ref Range Status  . WBC 09/07/2014 2.6* 3.9 - 10.3 10e3/uL Final  . NEUT# 09/07/2014 1.6  1.5 - 6.5 10e3/uL Final  . HGB 09/07/2014 11.1* 11.6 - 15.9 g/dL Final  . HCT 09/07/2014 33.4* 34.8 - 46.6 % Final  . Platelets 09/07/2014 337  145 - 400 10e3/uL Final  . MCV 09/07/2014 130.0* 79.5 - 101.0 fL Final  . MCH 09/07/2014 43.2* 25.1 - 34.0 pg Final  . MCHC 09/07/2014 33.2  31.5 - 36.0 g/dL Final  . RBC 09/07/2014 2.57* 3.70 - 5.45 10e6/uL Final  . RDW 09/07/2014 14.3  11.2 - 14.5 % Final  . lymph# 09/07/2014 0.8* 0.9 - 3.3 10e3/uL Final  . MONO# 09/07/2014 0.3  0.1 - 0.9 10e3/uL Final  . Eosinophils Absolute 09/07/2014 0.0  0.0 - 0.5 10e3/uL Final  . Basophils Absolute 09/07/2014 0.0  0.0 - 0.1 10e3/uL Final  . NEUT% 09/07/2014 59.7  38.4 - 76.8 % Final  . LYMPH% 09/07/2014 29.0  14.0 - 49.7 % Final  . MONO% 09/07/2014 9.7  0.0 - 14.0 % Final  . EOS% 09/07/2014 1.2  0.0 - 7.0 % Final  . BASO% 09/07/2014 0.4  0.0 - 2.0 % Final  Appointment on 08/17/2014  Component Date Value Ref Range Status  . WBC 08/17/2014 6.7  3.9 - 10.3 10e3/uL Final  . NEUT# 08/17/2014 5.2  1.5 - 6.5 10e3/uL Final  . HGB 08/17/2014 12.0  11.6 - 15.9 g/dL Final  . HCT 08/17/2014 36.7  34.8 - 46.6 % Final  . Platelets 08/17/2014 541* 145 - 400 10e3/uL Final  . MCV 08/17/2014 129.2* 79.5 - 101.0 fL Final  . MCH 08/17/2014 42.3* 25.1 - 34.0 pg Final  . MCHC 08/17/2014 32.7  31.5 -  36.0 g/dL Final  . RBC 08/17/2014 2.84* 3.70 - 5.45 10e6/uL Final  . RDW 08/17/2014 13.4  11.2 - 14.5 % Final  . lymph# 08/17/2014 0.8* 0.9 - 3.3 10e3/uL Final  . MONO# 08/17/2014 0.7  0.1 - 0.9 10e3/uL Final  . Eosinophils Absolute 08/17/2014 0.1  0.0 - 0.5 10e3/uL Final  . Basophils Absolute 08/17/2014 0.0  0.0 - 0.1 10e3/uL Final  . NEUT% 08/17/2014 77.6* 38.4 - 76.8 % Final  . LYMPH% 08/17/2014 11.2* 14.0 - 49.7 % Final  . MONO% 08/17/2014 9.9  0.0 - 14.0 % Final  . EOS% 08/17/2014 1.0  0.0 - 7.0 % Final  . BASO% 08/17/2014 0.3  0.0 - 2.0 % Final  . Sodium 08/17/2014 144  136 - 145 mEq/L Final  . Potassium 08/17/2014 4.2  3.5 - 5.1 mEq/L Final  . Chloride 08/17/2014 110* 98 - 109 mEq/L Final  . CO2 08/17/2014 30* 22 - 29 mEq/L Final  . Glucose 08/17/2014 94  70 - 140 mg/dl Final  . BUN 08/17/2014 25.8  7.0 - 26.0 mg/dL Final  . Creatinine 08/17/2014 0.7  0.6 - 1.1 mg/dL Final  . Total Bilirubin 08/17/2014 0.55  0.20 - 1.20 mg/dL Final  . Alkaline Phosphatase 08/17/2014 45  40 - 150 U/L Final  . AST 08/17/2014 16  5 - 34 U/L Final  . ALT 08/17/2014 7  0 - 55 U/L Final  . Total Protein 08/17/2014 5.6* 6.4 - 8.3 g/dL Final  . Albumin 08/17/2014 3.1* 3.5 - 5.0 g/dL Final  . Calcium 08/17/2014 8.7  8.4 - 10.4 mg/dL Final  . Anion Gap 08/17/2014 5  3 -  11 mEq/L Final  Abstract on 08/13/2014  Component Date Value Ref Range Status  . HM Dexa Scan 08/02/2014 [facility] Bone Density   Final     Assessment/Plan 1. Essential hypertension controlled  2. Hyperlipidemia controlled  3. Anemia, unspecified anemia type stable  4. Memory loss stable  5. Essential thrombocythemia improved  6. Loss of weight Continues to lose weight. Reason is not immediately apparent. Likely insufficient caloric intake despite report of a good appetite

## 2014-10-25 ENCOUNTER — Other Ambulatory Visit: Payer: Self-pay | Admitting: *Deleted

## 2014-10-25 DIAGNOSIS — D473 Essential (hemorrhagic) thrombocythemia: Secondary | ICD-10-CM

## 2014-10-26 ENCOUNTER — Other Ambulatory Visit (HOSPITAL_BASED_OUTPATIENT_CLINIC_OR_DEPARTMENT_OTHER): Payer: Medicare Other

## 2014-10-26 ENCOUNTER — Ambulatory Visit (HOSPITAL_BASED_OUTPATIENT_CLINIC_OR_DEPARTMENT_OTHER): Payer: Medicare Other | Admitting: Hematology

## 2014-10-26 ENCOUNTER — Telehealth: Payer: Self-pay | Admitting: Hematology

## 2014-10-26 VITALS — BP 151/59 | HR 54 | Temp 98.0°F | Resp 18 | Ht 61.0 in | Wt 94.4 lb

## 2014-10-26 DIAGNOSIS — D473 Essential (hemorrhagic) thrombocythemia: Secondary | ICD-10-CM

## 2014-10-26 LAB — CBC WITH DIFFERENTIAL/PLATELET
BASO%: 0.3 % (ref 0.0–2.0)
Basophils Absolute: 0 10*3/uL (ref 0.0–0.1)
EOS%: 1.3 % (ref 0.0–7.0)
Eosinophils Absolute: 0 10*3/uL (ref 0.0–0.5)
HEMATOCRIT: 34.8 % (ref 34.8–46.6)
HGB: 11.4 g/dL — ABNORMAL LOW (ref 11.6–15.9)
LYMPH%: 17.9 % (ref 14.0–49.7)
MCH: 44.9 pg — AB (ref 25.1–34.0)
MCHC: 32.8 g/dL (ref 31.5–36.0)
MCV: 137 fL — AB (ref 79.5–101.0)
MONO#: 0.3 10*3/uL (ref 0.1–0.9)
MONO%: 9.4 % (ref 0.0–14.0)
NEUT#: 2.2 10*3/uL (ref 1.5–6.5)
NEUT%: 71.1 % (ref 38.4–76.8)
Platelets: 285 10*3/uL (ref 145–400)
RBC: 2.54 10*6/uL — ABNORMAL LOW (ref 3.70–5.45)
RDW: 14.9 % — ABNORMAL HIGH (ref 11.2–14.5)
WBC: 3.1 10*3/uL — ABNORMAL LOW (ref 3.9–10.3)
lymph#: 0.6 10*3/uL — ABNORMAL LOW (ref 0.9–3.3)

## 2014-10-26 LAB — COMPREHENSIVE METABOLIC PANEL (CC13)
ALT: 9 U/L (ref 0–55)
ANION GAP: 8 meq/L (ref 3–11)
AST: 15 U/L (ref 5–34)
Albumin: 3.2 g/dL — ABNORMAL LOW (ref 3.5–5.0)
Alkaline Phosphatase: 48 U/L (ref 40–150)
BUN: 17.7 mg/dL (ref 7.0–26.0)
CALCIUM: 8.6 mg/dL (ref 8.4–10.4)
CHLORIDE: 107 meq/L (ref 98–109)
CO2: 29 mEq/L (ref 22–29)
CREATININE: 0.8 mg/dL (ref 0.6–1.1)
EGFR: 70 mL/min/{1.73_m2} — AB (ref >90)
GLUCOSE: 86 mg/dL (ref 70–140)
Potassium: 4 mEq/L (ref 3.5–5.1)
Sodium: 144 mEq/L (ref 136–145)
Total Bilirubin: 0.54 mg/dL (ref 0.20–1.20)
Total Protein: 5.4 g/dL — ABNORMAL LOW (ref 6.4–8.3)

## 2014-10-26 MED ORDER — HYDROXYUREA 500 MG PO CAPS: ORAL_CAPSULE | ORAL | Status: DC

## 2014-10-26 NOTE — Progress Notes (Signed)
Hematology and Oncology Follow Up Visit Date of Visit: 09/07/2014  [name] [name] [id number] 12-30-1922 80 y.o. GREEN, [name], MD  Chief Complaint: Essential Thrombocytosis.  Principle Diagnosis: Essential thrombocytosis, intitiated on Hydrea on 07/05/2011; JAK2 mutation analysis performed on 06/26/2011 was negative. Quantitative RT-PCR for BCR-ABL 1 performed on 07/07/2011 was not detected. Initially the patient was on Hydrea 1500 mg QOD and 1000 mg QOD, with significant drop in platelet count to 60,000 and neutropenia, adjusted to current dose of  Hydrea 500 mg ii po qod and i po qod, with adequate values.  Current therapy:  Changed dose of Hydrea today to 1,000 mg 5 days Monday through Friday and 500 mg on Weekends Saturday/Sunday. Aspirin 81 mg also for thromboprophylaxis.  Interim History:  [name] [name] returns to the office of the [facility] for a followup visit. She is doing well oveall. She lives in an apartment in [facility], she still cooks for herself and functions well at home. She denies any headaches, nausea, vomiting, shortness of breath, or cardiac complaints. No GI or GU complaints . Denies any bleeding issues, or mucositis. Her appetite is adequate and weight is stable.   She takes hydrea as instructed, and tolerates it very well without any complains.   Medications: I have reviewed the patient's current medications.  Current Outpatient Prescriptions  Medication Sig Dispense Refill  . aspirin 81 MG tablet Take 81 mg by mouth daily.    Marland Kitchen atenolol (TENORMIN) 50 MG tablet Take by mouth. Take 1/2 tablet twice daily for blood pressure    . calcitonin, salmon, (MIACALCIN/FORTICAL) 200 UNIT/ACT nasal spray USE 1 SQUIRT IN ALTERNATING NOSTRILS DAILY FOR BONES 3.7 mL 5  . Calcium-Vitamin D-Vitamin K (VIACTIV) 403-474-25 MG-UNT-MCG CHEW Chew 1 each by mouth daily.    . dorzolamide-timolol (COSOPT) 22.3-6.8 MG/ML ophthalmic solution Place 1 drop into both eyes 2 (two) times daily.      . flurbiprofen (ANSAID) 100 MG tablet TAKE 1 TABLET BY MOUTH EVERY DAY 90 tablet 0  . Glucosamine-Chondroit-Vit C-Mn (GLUCOSAMINE CHONDR 1500 COMPLX) CAPS Take 2 capsules by mouth daily.     . hydroxyurea (HYDREA) 500 MG capsule take 2 capsules by mouth Monday through  Friday  and 1 capsule on sat/sunday (weekend)    . latanoprost (XALATAN) 0.005 % ophthalmic solution Place 1 drop into both eyes daily.    . Magnesium 250 MG TABS Take by mouth. Take one tablet daily to help with constipation    . URELLE (URELLE/URISED) 81 MG TABS Take 1 tablet by mouth daily. Take one every other day    . vitamin E 400 UNIT capsule Take 400 Units by mouth every other day.     No current facility-administered medications for this visit.   Allergies:  Allergies  Allergen Reactions  . Darvocet [Propoxyphene N-Acetaminophen] Nausea And Vomiting  . Darvon Nausea And Vomiting  . Meperidine And Related Nausea And Vomiting  . Vicodin [Hydrocodone-Acetaminophen] Nausea And Vomiting   Past Medical History, Surgical history, Social history, and Family History were reviewed and updated.  Review of Systems: Constitutional:  Negative for fever, chills, night sweats, anorexia, weight loss, pain. Cardiovascular: no chest pain or dyspnea on exertion Respiratory: no cough, shortness of breath, or wheezing Neurological: no TIA or stroke symptoms Dermatological: negative for rash ENT: negative for - epistaxis or headaches Skin: Negative. Gastrointestinal: no abdominal pain, change in bowel habits, or black or bloody stools Genito-Urinary: no dysuria, trouble voiding, or hematuria Hematological and Lymphatic: negative for - bleeding  problems or blood clots Breast: negative for breast lumps Musculoskeletal: negative for - gait disturbance Remaining ROS negative.  Physical Exam: Blood pressure 151/59, pulse 54, temperature 98 F (36.7 C), temperature source Oral, resp. rate 18, height 5\' 1"  (1.549 m), weight 94 lb  6.4 oz (42.82 kg), SpO2 99 %. ECOG: 0 General appearance: alert, cooperative, appears stated age and no distress Head: Normocephalic, without obvious abnormality, atraumatic Neck: no adenopathy, supple, symmetrical, trachea midline and thyroid not enlarged, symmetric, no tenderness/mass/nodules HEENT: OP clear; PERRL; EOMi.  Lymph nodes: Cervical, supraclavicular, and axillary nodes normal. Heart:regular rate and rhythm, S1, S2 normal, no murmur, click, rub or gallop Lung:chest clear, no wheezing, rales, normal symmetric air entry Abdomin: soft, non-tender, without masses or organomegaly EXT:No peripheral edema   Lab Results: CBC Latest Ref Rng 10/26/2014 09/28/2014 09/07/2014  WBC 3.9 - 10.3 10e3/uL 3.1(L) 3.2(L) 2.6(L)  Hemoglobin 11.6 - 15.9 g/dL 11.4(L) 11.5(L) 11.1(L)  Hematocrit 34.8 - 46.6 % 34.8 34.1(L) 33.4(L)  Platelets 145 - 400 10e3/uL 285 343 337    CMP Latest Ref Rng 10/26/2014 10/11/2014 09/28/2014  Glucose 70 - 140 mg/dl 86 - 93  BUN 7.0 - 26.0 mg/dL 17.7 19 18.5  Creatinine 0.6 - 1.1 mg/dL 0.8 0.7 0.8  Sodium 136 - 145 mEq/L 144 141 144  Potassium 3.5 - 5.1 mEq/L 4.0 4.4 3.7  Chloride 97 - 108 mmol/L - - -  CO2 22 - 29 mEq/L 29 - 27  Calcium 8.4 - 10.4 mg/dL 8.6 - 8.4  Total Protein 6.4 - 8.3 g/dL 5.4(L) - -  Total Bilirubin 0.20 - 1.20 mg/dL 0.54 - -  Alkaline Phos 40 - 150 U/L 48 41 -  AST 5 - 34 U/L 15 13 -  ALT 0 - 55 U/L 9 8 -       Impression and Plan:  1. Essential Thrombocytosis.  -- [name] [name] is an 80 year old woman with essential thrombocytosis, currently on Hydrea, tolerating this current dosage well. MCV high meaning she is compliant.   -- We reviewed her labs, her plt has been adequately controlled. Mild anemia and leukocytopenia are also stable. -continue current hydrea dose.  Continue ASA  2. She will continue follow up with her PCP for other medical issues.    Plan:   -continue hydrea 1000mg  M-F and 500mg  on weekend -RTC in 2 month  with repeated lab   Truitt Merle  10/26/2014

## 2014-10-26 NOTE — Telephone Encounter (Signed)
Gave avs & cal for Feb. °

## 2014-10-27 ENCOUNTER — Encounter: Payer: Self-pay | Admitting: Hematology

## 2014-11-24 ENCOUNTER — Other Ambulatory Visit: Payer: Self-pay | Admitting: Internal Medicine

## 2014-12-21 ENCOUNTER — Other Ambulatory Visit (HOSPITAL_BASED_OUTPATIENT_CLINIC_OR_DEPARTMENT_OTHER): Payer: Medicare Other

## 2014-12-21 ENCOUNTER — Encounter: Payer: Self-pay | Admitting: Hematology

## 2014-12-21 ENCOUNTER — Telehealth: Payer: Self-pay | Admitting: Hematology

## 2014-12-21 ENCOUNTER — Ambulatory Visit (HOSPITAL_BASED_OUTPATIENT_CLINIC_OR_DEPARTMENT_OTHER): Payer: Medicare Other | Admitting: Hematology

## 2014-12-21 VITALS — BP 167/68 | HR 52 | Temp 98.3°F | Resp 18 | Ht 61.0 in | Wt 93.2 lb

## 2014-12-21 DIAGNOSIS — D473 Essential (hemorrhagic) thrombocythemia: Secondary | ICD-10-CM | POA: Diagnosis not present

## 2014-12-21 DIAGNOSIS — D75839 Thrombocytosis, unspecified: Secondary | ICD-10-CM

## 2014-12-21 LAB — CBC WITH DIFFERENTIAL/PLATELET
BASO%: 0.5 % (ref 0.0–2.0)
Basophils Absolute: 0 10*3/uL (ref 0.0–0.1)
EOS%: 0.8 % (ref 0.0–7.0)
Eosinophils Absolute: 0 10*3/uL (ref 0.0–0.5)
HCT: 35.6 % (ref 34.8–46.6)
HGB: 11.9 g/dL (ref 11.6–15.9)
LYMPH%: 17.1 % (ref 14.0–49.7)
MCH: 47 pg — ABNORMAL HIGH (ref 25.1–34.0)
MCHC: 33.4 g/dL (ref 31.5–36.0)
MCV: 140.7 fL — ABNORMAL HIGH (ref 79.5–101.0)
MONO#: 0.4 10*3/uL (ref 0.1–0.9)
MONO%: 11 % (ref 0.0–14.0)
NEUT#: 2.7 10*3/uL (ref 1.5–6.5)
NEUT%: 70.6 % (ref 38.4–76.8)
Platelets: 259 10*3/uL (ref 145–400)
RBC: 2.53 10*6/uL — ABNORMAL LOW (ref 3.70–5.45)
RDW: 13 % (ref 11.2–14.5)
WBC: 3.8 10*3/uL — ABNORMAL LOW (ref 3.9–10.3)
lymph#: 0.7 10*3/uL — ABNORMAL LOW (ref 0.9–3.3)

## 2014-12-21 LAB — COMPREHENSIVE METABOLIC PANEL (CC13)
ALT: 9 U/L (ref 0–55)
AST: 16 U/L (ref 5–34)
Albumin: 3.3 g/dL — ABNORMAL LOW (ref 3.5–5.0)
Alkaline Phosphatase: 47 U/L (ref 40–150)
Anion Gap: 7 mEq/L (ref 3–11)
BUN: 24 mg/dL (ref 7.0–26.0)
CO2: 30 mEq/L — ABNORMAL HIGH (ref 22–29)
Calcium: 8.9 mg/dL (ref 8.4–10.4)
Chloride: 109 mEq/L (ref 98–109)
Creatinine: 0.9 mg/dL (ref 0.6–1.1)
EGFR: 60 mL/min/{1.73_m2} — ABNORMAL LOW (ref >90)
Glucose: 99 mg/dl (ref 70–140)
Potassium: 4.5 mEq/L (ref 3.5–5.1)
Sodium: 145 mEq/L (ref 136–145)
Total Bilirubin: 0.59 mg/dL (ref 0.20–1.20)
Total Protein: 5.6 g/dL — ABNORMAL LOW (ref 6.4–8.3)

## 2014-12-21 NOTE — Telephone Encounter (Signed)
Gave avs & calendar for April. °

## 2014-12-21 NOTE — Progress Notes (Signed)
Hematology and Oncology Follow Up Visit Date of Visit: 09/07/2014  [name] [name] [id number] 08-13-1923 81 y.o. [name], [name] Spare, MD  Chief Complaint: Essential Thrombocytosis.  Principle Diagnosis: Essential thrombocytosis, intitiated on Hydrea on 07/05/2011; JAK2 mutation (-), BCR-ABL(-)   Previous therapy: Initially the patient was on Hydrea 1500 mg QOD and 1000 mg QOD, with significant drop in platelet count to 60,000 and neutropenia,   Current therapy:  Changed dose of Hydrea today to 1,000 mg 5 days Monday through Friday and 500 mg on Weekends Saturday/Sunday. Aspirin 81 mg also for thromboprophylaxis.  Interim History:  [name] [name] returns to the office of the [facility] for a followup visit. She is doing well oveall, no new complaints since her last visit.   She takes hydrea as instructed, and tolerates it very well without any complains. She lives in an apartment in [facility], she still cooks for herself and functions well at home. She denies any headaches, nausea, vomiting, shortness of breath, or cardiac complaints. No GI or GU complaints . Denies any bleeding issues, or mucositis. Her appetite is adequate and weight is stable.   Medications: I have reviewed the patient's current medications.  Current Outpatient Prescriptions  Medication Sig Dispense Refill  . aspirin 81 MG tablet Take 81 mg by mouth daily.    Marland Kitchen atenolol (TENORMIN) 50 MG tablet Take by mouth. Take 1/2 tablet twice daily for blood pressure    . calcitonin, salmon, (MIACALCIN/FORTICAL) 200 UNIT/ACT nasal spray USE 1 SQUIRT IN ALTERNATING NOSTRILS DAILY FOR BONES 3.7 mL 5  . Calcium-Vitamin D-Vitamin K (VIACTIV) 938-101-75 MG-UNT-MCG CHEW Chew 1 each by mouth daily.    . dorzolamide-timolol (COSOPT) 22.3-6.8 MG/ML ophthalmic solution Place 1 drop into both eyes 2 (two) times daily.     . flurbiprofen (ANSAID) 100 MG tablet TAKE 1 TABLET BY MOUTH EVERY DAY 90 tablet 0  . flurbiprofen (ANSAID) 100 MG tablet TAKE 1  TABLET BY MOUTH EVERY DAY 90 tablet 1  . Glucosamine-Chondroit-Vit C-Mn (GLUCOSAMINE CHONDR 1500 COMPLX) CAPS Take 2 capsules by mouth daily.     . hydroxyurea (HYDREA) 500 MG capsule take 2 capsules by mouth Monday through  Friday  and 1 capsule on sat/sunday (weekend) 54 capsule 5  . latanoprost (XALATAN) 0.005 % ophthalmic solution Place 1 drop into both eyes daily.    . Magnesium 250 MG TABS Take by mouth. Take one tablet daily to help with constipation    . URELLE (URELLE/URISED) 81 MG TABS Take 1 tablet by mouth daily. Take one every other day    . vitamin E 400 UNIT capsule Take 400 Units by mouth every other day.    Marland Kitchen amoxicillin (AMOXIL) 500 MG capsule Take 500 mg by mouth as needed (Takes prior to dental appt.).   4   No current facility-administered medications for this visit.   Allergies:  Allergies  Allergen Reactions  . Darvocet [Propoxyphene N-Acetaminophen] Nausea And Vomiting  . Darvon Nausea And Vomiting  . Meperidine And Related Nausea And Vomiting  . Vicodin [Hydrocodone-Acetaminophen] Nausea And Vomiting   Past Medical History, Surgical history, Social history, and Family History were reviewed and updated.  Review of Systems: Constitutional:  Negative for fever, chills, night sweats, anorexia, weight loss, pain. Cardiovascular: no chest pain or dyspnea on exertion Respiratory: no cough, shortness of breath, or wheezing Neurological: no TIA or stroke symptoms Dermatological: negative for rash ENT: negative for - epistaxis or headaches Skin: Negative. Gastrointestinal: no abdominal pain, change in bowel habits, or  black or bloody stools Genito-Urinary: no dysuria, trouble voiding, or hematuria Hematological and Lymphatic: negative for - bleeding problems or blood clots Breast: negative for breast lumps Musculoskeletal: negative for - gait disturbance Remaining ROS negative.  Physical Exam: Blood pressure 167/68, pulse 52, temperature 98.3 F (36.8 C),  temperature source Oral, resp. rate 18, height 5\' 1"  (1.549 m), weight 93 lb 3.2 oz (42.275 kg). ECOG: 0 General appearance: alert, cooperative, appears stated age and no distress Head: Normocephalic, without obvious abnormality, atraumatic Neck: no adenopathy, supple, symmetrical, trachea midline and thyroid not enlarged, symmetric, no tenderness/mass/nodules HEENT: OP clear; PERRL; EOMi.  Lymph nodes: Cervical, supraclavicular, and axillary nodes normal. Heart:regular rate and rhythm, S1, S2 normal, no murmur, click, rub or gallop Lung:chest clear, no wheezing, rales, normal symmetric air entry Abdomin: soft, non-tender, without masses or organomegaly EXT:No peripheral edema   Lab Results: CBC Latest Ref Rng 12/21/2014 10/26/2014 09/28/2014  WBC 3.9 - 10.3 10e3/uL 3.8(L) 3.1(L) 3.2(L)  Hemoglobin 11.6 - 15.9 g/dL 11.9 11.4(L) 11.5(L)  Hematocrit 34.8 - 46.6 % 35.6 34.8 34.1(L)  Platelets 145 - 400 10e3/uL 259 285 343    CMP Latest Ref Rng 12/21/2014 10/26/2014 10/11/2014  Glucose 70 - 140 mg/dl 99 86 -  BUN 7.0 - 26.0 mg/dL 24.0 17.7 19  Creatinine 0.6 - 1.1 mg/dL 0.9 0.8 0.7  Sodium 136 - 145 mEq/L 145 144 141  Potassium 3.5 - 5.1 mEq/L 4.5 4.0 4.4  Chloride 97 - 108 mmol/L - - -  CO2 22 - 29 mEq/L 30(H) 29 -  Calcium 8.4 - 10.4 mg/dL 8.9 8.6 -  Total Protein 6.4 - 8.3 g/dL 5.6(L) 5.4(L) -  Total Bilirubin 0.20 - 1.20 mg/dL 0.59 0.54 -  Alkaline Phos 40 - 150 U/L 47 48 41  AST 5 - 34 U/L 16 15 13   ALT 0 - 55 U/L 9 9 8        Impression and Plan:  1. Essential Thrombocytosis.  -- [name] [name] is an 81 year old woman with essential thrombocytosis, currently on Hydrea, tolerating this current dosage well. MCV high meaning she is compliant.   -- We reviewed her labs, her plt has been adequately controlled. He had been slightly trending down. If her platelet count dropped to below 200, I'll cut back her Hydrea dose. Mild anemia and leukocytopenia are also stable. -continue  current hydrea dose.  Continue ASA  2. She will continue follow up with her PCP for other medical issues.    Plan:   -continue hydrea 1000mg  M-F and 500mg  on weekend -RTC in 2 month with repeated lab   Truitt Merle  12/21/2014

## 2014-12-24 ENCOUNTER — Other Ambulatory Visit: Payer: Self-pay | Admitting: Internal Medicine

## 2014-12-24 DIAGNOSIS — D473 Essential (hemorrhagic) thrombocythemia: Secondary | ICD-10-CM

## 2015-01-22 DIAGNOSIS — D473 Essential (hemorrhagic) thrombocythemia: Secondary | ICD-10-CM | POA: Diagnosis not present

## 2015-01-22 DIAGNOSIS — I359 Nonrheumatic aortic valve disorder, unspecified: Secondary | ICD-10-CM | POA: Diagnosis not present

## 2015-01-22 DIAGNOSIS — I493 Ventricular premature depolarization: Secondary | ICD-10-CM | POA: Diagnosis not present

## 2015-01-22 DIAGNOSIS — I447 Left bundle-branch block, unspecified: Secondary | ICD-10-CM | POA: Diagnosis not present

## 2015-02-12 DIAGNOSIS — I10 Essential (primary) hypertension: Secondary | ICD-10-CM | POA: Diagnosis not present

## 2015-02-12 DIAGNOSIS — E785 Hyperlipidemia, unspecified: Secondary | ICD-10-CM | POA: Diagnosis not present

## 2015-02-12 LAB — LIPID PANEL
CHOLESTEROL: 178 mg/dL (ref 0–200)
HDL: 77 mg/dL — AB (ref 35–70)
LDL Cholesterol: 83 mg/dL
TRIGLYCERIDES: 92 mg/dL (ref 40–160)

## 2015-02-12 LAB — HEPATIC FUNCTION PANEL
AST: 15 U/L (ref 13–35)
Alkaline Phosphatase: 42 U/L (ref 25–125)
BILIRUBIN, TOTAL: 0.5 mg/dL

## 2015-02-12 LAB — BASIC METABOLIC PANEL
BUN: 20 mg/dL (ref 4–21)
Creatinine: 0.7 mg/dL (ref 0.5–1.1)
GLUCOSE: 84 mg/dL
Potassium: 4.3 mmol/L (ref 3.4–5.3)
Sodium: 140 mmol/L (ref 137–147)

## 2015-02-19 ENCOUNTER — Encounter: Payer: Self-pay | Admitting: Hematology

## 2015-02-19 ENCOUNTER — Other Ambulatory Visit (HOSPITAL_BASED_OUTPATIENT_CLINIC_OR_DEPARTMENT_OTHER): Payer: Medicare Other

## 2015-02-19 ENCOUNTER — Ambulatory Visit (HOSPITAL_BASED_OUTPATIENT_CLINIC_OR_DEPARTMENT_OTHER): Payer: Medicare Other | Admitting: Hematology

## 2015-02-19 ENCOUNTER — Telehealth: Payer: Self-pay | Admitting: Hematology

## 2015-02-19 VITALS — BP 141/66 | HR 54 | Temp 98.4°F | Resp 17 | Ht 61.0 in | Wt 94.5 lb

## 2015-02-19 DIAGNOSIS — D473 Essential (hemorrhagic) thrombocythemia: Secondary | ICD-10-CM

## 2015-02-19 LAB — CBC WITH DIFFERENTIAL/PLATELET
BASO%: 0.3 % (ref 0.0–2.0)
Basophils Absolute: 0 10*3/uL (ref 0.0–0.1)
EOS%: 0.6 % (ref 0.0–7.0)
Eosinophils Absolute: 0 10*3/uL (ref 0.0–0.5)
HCT: 35.6 % (ref 34.8–46.6)
HGB: 12 g/dL (ref 11.6–15.9)
LYMPH#: 0.7 10*3/uL — AB (ref 0.9–3.3)
LYMPH%: 22.2 % (ref 14.0–49.7)
MCH: 46 pg — AB (ref 25.1–34.0)
MCHC: 33.7 g/dL (ref 31.5–36.0)
MCV: 136.4 fL — ABNORMAL HIGH (ref 79.5–101.0)
MONO#: 0.4 10*3/uL (ref 0.1–0.9)
MONO%: 13.2 % (ref 0.0–14.0)
NEUT#: 2.1 10*3/uL (ref 1.5–6.5)
NEUT%: 63.7 % (ref 38.4–76.8)
Platelets: 310 10*3/uL (ref 145–400)
RBC: 2.61 10*6/uL — ABNORMAL LOW (ref 3.70–5.45)
RDW: 12.9 % (ref 11.2–14.5)
WBC: 3.3 10*3/uL — ABNORMAL LOW (ref 3.9–10.3)

## 2015-02-19 LAB — COMPREHENSIVE METABOLIC PANEL (CC13)
ALK PHOS: 46 U/L (ref 40–150)
ALT: 7 U/L (ref 0–55)
AST: 15 U/L (ref 5–34)
Albumin: 3.1 g/dL — ABNORMAL LOW (ref 3.5–5.0)
Anion Gap: 6 mEq/L (ref 3–11)
BUN: 23 mg/dL (ref 7.0–26.0)
CHLORIDE: 109 meq/L (ref 98–109)
CO2: 28 mEq/L (ref 22–29)
Calcium: 8.3 mg/dL — ABNORMAL LOW (ref 8.4–10.4)
Creatinine: 0.8 mg/dL (ref 0.6–1.1)
EGFR: 67 mL/min/{1.73_m2} — ABNORMAL LOW (ref >90)
Glucose: 98 mg/dl (ref 70–140)
POTASSIUM: 4.2 meq/L (ref 3.5–5.1)
SODIUM: 143 meq/L (ref 136–145)
Total Bilirubin: 0.46 mg/dL (ref 0.20–1.20)
Total Protein: 5.4 g/dL — ABNORMAL LOW (ref 6.4–8.3)

## 2015-02-19 NOTE — Telephone Encounter (Signed)
gave and printed appt sched and avs fo rpt for JULY  °

## 2015-02-19 NOTE — Progress Notes (Signed)
Hematology and Oncology Follow Up Visit Date of Visit: 09/07/2014  [name] [name] [id number] 1923-01-31 81 y.o. GREEN, [name], MD  Chief Complaint: Essential Thrombocytosis.  Principle Diagnosis: Essential thrombocytosis, intitiated on Hydrea on 07/05/2011; JAK2 mutation (-), BCR-ABL(-)   Previous therapy: Initially the patient was on Hydrea 1500 mg QOD and 1000 mg QOD, with significant drop in platelet count to 60,000 and neutropenia,   Current therapy:  Hydrea 1,000 mg 5 days Monday through Friday and 500 mg on Weekends Saturday/Sunday. Aspirin 81 mg also for thromboprophylaxis.  Interim History:  [name] [name] returns to the office for follow-up. She is doing well oveall, no new complaints since her last visit. She takes hydrea as instructed, she uses a pill box.  She tolerates it very well without any complains. She lives in an apartment in [facility], she still cooks for herself and functions well at home. She feels well, denies any pain, nausea, dyspnea or other new complains. She denies any episodes of bleeding. No ED visit or hospitalization since her last visit.  Medications: I have reviewed the patient's current medications.  Current Outpatient Prescriptions  Medication Sig Dispense Refill  . amoxicillin (AMOXIL) 500 MG capsule Take 500 mg by mouth as needed (Takes prior to dental appt.).   4  . aspirin 81 MG tablet Take 81 mg by mouth daily.    Marland Kitchen atenolol (TENORMIN) 50 MG tablet Take by mouth. Take 1/2 tablet twice daily for blood pressure    . calcitonin, salmon, (MIACALCIN/FORTICAL) 200 UNIT/ACT nasal spray USE 1 SQUIRT IN ALTERNATING NOSTRILS DAILY FOR BONES 3.7 mL 5  . Calcium-Vitamin D-Vitamin K (VIACTIV) 716-967-89 MG-UNT-MCG CHEW Chew 1 each by mouth daily.    . dorzolamide-timolol (COSOPT) 22.3-6.8 MG/ML ophthalmic solution Place 1 drop into both eyes 2 (two) times daily.     . flurbiprofen (ANSAID) 100 MG tablet TAKE 1 TABLET BY MOUTH EVERY DAY 90 tablet 0  .  flurbiprofen (ANSAID) 100 MG tablet TAKE 1 TABLET BY MOUTH EVERY DAY 90 tablet 1  . Glucosamine-Chondroit-Vit C-Mn (GLUCOSAMINE CHONDR 1500 COMPLX) CAPS Take 2 capsules by mouth daily.     . hydroxyurea (HYDREA) 500 MG capsule take 2 capsules by mouth Monday through  Friday  and 1 capsule on sat/sunday (weekend) 54 capsule 5  . hydroxyurea (HYDREA) 500 MG capsule Take 2 capsules by mouth Monday through Friday. Take 1 capsule by mouth Saturday and Sunday. 100 capsule 1  . latanoprost (XALATAN) 0.005 % ophthalmic solution Place 1 drop into both eyes daily.    . Magnesium 250 MG TABS Take by mouth. Take one tablet daily to help with constipation    . URELLE (URELLE/URISED) 81 MG TABS Take 1 tablet by mouth daily. Take one every other day    . vitamin E 400 UNIT capsule Take 400 Units by mouth every other day.     No current facility-administered medications for this visit.   Allergies:  Allergies  Allergen Reactions  . Darvocet [Propoxyphene N-Acetaminophen] Nausea And Vomiting  . Darvon Nausea And Vomiting  . Meperidine And Related Nausea And Vomiting  . Vicodin [Hydrocodone-Acetaminophen] Nausea And Vomiting   Past Medical History, Surgical history, Social history, and Family History were reviewed and updated.  Review of Systems: Constitutional:  Negative for fever, chills, night sweats, anorexia, weight loss, pain. Cardiovascular: no chest pain or dyspnea on exertion Respiratory: no cough, shortness of breath, or wheezing Neurological: no TIA or stroke symptoms Dermatological: negative for rash ENT: negative for - epistaxis  or headaches Skin: Negative. Gastrointestinal: no abdominal pain, change in bowel habits, or black or bloody stools Genito-Urinary: no dysuria, trouble voiding, or hematuria Hematological and Lymphatic: negative for - bleeding problems or blood clots Breast: negative for breast lumps Musculoskeletal: negative for - gait disturbance Remaining ROS  negative.  Physical Exam: Blood pressure 141/66, pulse 54, temperature 98.4 F (36.9 C), temperature source Oral, resp. rate 17, height 5\' 1"  (1.549 m), weight 94 lb 8 oz (42.865 kg), SpO2 100 %. ECOG: 0 General appearance: alert, cooperative, appears stated age and no distress Head: Normocephalic, without obvious abnormality, atraumatic Neck: no adenopathy, supple, symmetrical, trachea midline and thyroid not enlarged, symmetric, no tenderness/mass/nodules HEENT: OP clear; PERRL; EOMi.  Lymph nodes: Cervical, supraclavicular, and axillary nodes normal. Heart:regular rate and rhythm, S1, S2 normal, no murmur, click, rub or gallop Lung:chest clear, no wheezing, rales, normal symmetric air entry Abdomin: soft, non-tender, without masses or organomegaly EXT:No peripheral edema   Lab Results: CBC Latest Ref Rng 02/19/2015 12/21/2014 10/26/2014  WBC 3.9 - 10.3 10e3/uL 3.3(L) 3.8(L) 3.1(L)  Hemoglobin 11.6 - 15.9 g/dL 12.0 11.9 11.4(L)  Hematocrit 34.8 - 46.6 % 35.6 35.6 34.8  Platelets 145 - 400 10e3/uL 310 259 285    CMP Latest Ref Rng 02/19/2015 02/12/2015 12/21/2014  Glucose 70 - 140 mg/dl 98 - 99  BUN 7.0 - 26.0 mg/dL 23.0 20 24.0  Creatinine 0.6 - 1.1 mg/dL 0.8 0.7 0.9  Sodium 136 - 145 mEq/L 143 140 145  Potassium 3.5 - 5.1 mEq/L 4.2 4.3 4.5  Chloride 97 - 108 mmol/L - - -  CO2 22 - 29 mEq/L 28 - 30(H)  Calcium 8.4 - 10.4 mg/dL 8.3(L) - 8.9  Total Protein 6.4 - 8.3 g/dL 5.4(L) - 5.6(L)  Total Bilirubin 0.20 - 1.20 mg/dL 0.46 - 0.59  Alkaline Phos 40 - 150 U/L 46 42 47  AST 5 - 34 U/L 15 15 16   ALT 0 - 55 U/L 7 - 9       Impression and Plan:  1. Essential thrombocythemia -- [name] [name] is an 81 year old woman with essential thrombocytosis, currently on Hydrea, tolerating this current dosage well. MCV high meaning she is compliant.   -We reviewed the natural history of on essential thrombocythemia.  -- We reviewed her labs, her plt has been adequately controlled.  -Mild  anemia and it leukocytopenia likely secondary to Hydrea, blood counts are stable.  -We'll continue the current Hydrea dose  -Continue ASA  2. She will continue follow up with her PCP for other medical issues.    Plan:   -continue hydrea 1000mg  M-F and 500mg  on weekend -RTC in 3 month with repeated lab   Truitt Merle  02/19/2015

## 2015-02-21 ENCOUNTER — Non-Acute Institutional Stay: Payer: Medicare Other | Admitting: Internal Medicine

## 2015-02-21 ENCOUNTER — Encounter: Payer: Self-pay | Admitting: Internal Medicine

## 2015-02-21 VITALS — BP 124/60 | HR 60 | Temp 98.6°F | Wt 93.8 lb

## 2015-02-21 DIAGNOSIS — R634 Abnormal weight loss: Secondary | ICD-10-CM | POA: Diagnosis not present

## 2015-02-21 DIAGNOSIS — E785 Hyperlipidemia, unspecified: Secondary | ICD-10-CM | POA: Diagnosis not present

## 2015-02-21 DIAGNOSIS — D473 Essential (hemorrhagic) thrombocythemia: Secondary | ICD-10-CM

## 2015-02-21 DIAGNOSIS — D649 Anemia, unspecified: Secondary | ICD-10-CM

## 2015-02-21 DIAGNOSIS — R413 Other amnesia: Secondary | ICD-10-CM | POA: Diagnosis not present

## 2015-02-21 DIAGNOSIS — I10 Essential (primary) hypertension: Secondary | ICD-10-CM

## 2015-02-21 NOTE — Progress Notes (Signed)
Patient ID: [name] [name], female   DOB: 12-22-1922, 81 y.o.   MRN: [id number]    [facility] (12[facility]     Allergies  Allergen Reactions  . Darvocet [Propoxyphene N-Acetaminophen] Nausea And Vomiting  . Darvon Nausea And Vomiting  . Meperidine And Related Nausea And Vomiting  . Vicodin [Hydrocodone-Acetaminophen] Nausea And Vomiting    Chief Complaint  Patient presents with  . Medical Management of Chronic Issues    blood pressure, anemia, memory, weight lost    HPI:  Essential hypertension: Controlled  Anemia, unspecified anemia type: Resolved  Memory loss: Stable  Loss of weight: Stable weight  Hyperlipidemia: Normal recent lab  Essential thrombocythemia: Followed at hematology. On hydroxyurea treatment. Last hemoglobin normal. White blood cells slightly low. Platelets 310,000.    Medications: Patient's Medications  New Prescriptions   No medications on file  Previous Medications   AMOXICILLIN (AMOXIL) 500 MG CAPSULE    Take 500 mg by mouth as needed (Takes prior to dental appt.).    ASPIRIN 81 MG TABLET    Take 81 mg by mouth daily.   ATENOLOL (TENORMIN) 50 MG TABLET    Take by mouth. Take 1/2 tablet twice daily for blood pressure   CALCITONIN, SALMON, (MIACALCIN/FORTICAL) 200 UNIT/ACT NASAL SPRAY    USE 1 SQUIRT IN ALTERNATING NOSTRILS DAILY FOR BONES   CALCIUM-VITAMIN D-VITAMIN K (VIACTIV) [id number] MG-UNT-MCG CHEW    Chew 1 each by mouth daily.   DORZOLAMIDE-TIMOLOL (COSOPT) 22.3-6.8 MG/ML OPHTHALMIC SOLUTION    Place 1 drop into both eyes 2 (two) times daily.    FLURBIPROFEN (ANSAID) 100 MG TABLET    TAKE 1 TABLET BY MOUTH EVERY DAY   GLUCOSAMINE-CHONDROIT-VIT C-MN (GLUCOSAMINE CHONDR 1500 COMPLX) CAPS    Take 2 capsules by mouth daily.    HYDROXYUREA (HYDREA) 500 MG CAPSULE    take 2 capsules by mouth Monday through  Friday  and 1 capsule on sat/sunday (weekend)   LATANOPROST (XALATAN) 0.005 % OPHTHALMIC SOLUTION     Place 1 drop into both eyes daily.   MAGNESIUM 250 MG TABS    Take by mouth. Take one tablet daily to help with constipation   URELLE (URELLE/URISED) 81 MG TABS    Take 1 tablet by mouth daily. Take one tablet once a day for bladder   VITAMIN E 400 UNIT CAPSULE    Take 400 Units by mouth every other day.  Modified Medications   No medications on file  Discontinued Medications   No medications on file     Review of Systems  Constitutional: Negative for fever, diaphoresis, activity change, appetite change, fatigue and unexpected weight change.  HENT: Positive for voice change (hoarse since September 2014).   Eyes: Negative.   Respiratory: Negative.   Cardiovascular: Negative.   Gastrointestinal: Negative.   Endocrine: Negative.   Genitourinary: Negative.   Musculoskeletal: Negative.   Neurological: Negative.   Hematological:       Thrombocythemia  Psychiatric/Behavioral: Negative.     Filed Vitals:   02/21/15 1419  BP: 124/60  Pulse: 60  Temp: 98.6 F (37 C)  TempSrc: Oral  Weight: 93 lb 12.8 oz (42.547 kg)   Body mass index is 17.73 kg/(m^2).  Physical Exam  Constitutional: She is oriented to person, place, and time. She appears well-developed and well-nourished. No distress.  HENT:  Head: Normocephalic and atraumatic.  Right Ear: External ear normal.  Left Ear: External ear normal.  Nose: Nose normal.  Mouth/Throat: Oropharynx is clear and moist.  Eyes: Conjunctivae and EOM are normal. Pupils are equal, round, and reactive to light.  Corrective lenses  Neck: Neck supple. No JVD present. No tracheal deviation present. No thyromegaly present.  Cardiovascular: Normal rate and regular rhythm.  Exam reveals no gallop and no friction rub.   Murmur (2/6/LSB SEM) heard. Pulmonary/Chest: No respiratory distress. She has no wheezes. She has no rales.  Abdominal: She exhibits no distension and no mass. There is no tenderness.  Musculoskeletal: She exhibits no edema or  tenderness.  Heberden's nodes. Bouchard's nodes. Contracture of the right hand.  Lymphadenopathy:    She has no cervical adenopathy.  Neurological: She is alert and oriented to person, place, and time. She has normal reflexes. No cranial nerve deficit. Coordination normal.  Skin: No rash noted. No erythema. No pallor.  Psychiatric: She has a normal mood and affect. Her behavior is normal. Judgment and thought content normal.     Labs reviewed: Appointment on 02/19/2015  Component Date Value Ref Range Status  . WBC 02/19/2015 3.3* 3.9 - 10.3 10e3/uL Final  . NEUT# 02/19/2015 2.1  1.5 - 6.5 10e3/uL Final  . HGB 02/19/2015 12.0  11.6 - 15.9 g/dL Final  . HCT 02/19/2015 35.6  34.8 - 46.6 % Final  . Platelets 02/19/2015 310  145 - 400 10e3/uL Final  . MCV 02/19/2015 136.4* 79.5 - 101.0 fL Final  . MCH 02/19/2015 46.0* 25.1 - 34.0 pg Final  . MCHC 02/19/2015 33.7  31.5 - 36.0 g/dL Final  . RBC 02/19/2015 2.61* 3.70 - 5.45 10e6/uL Final  . RDW 02/19/2015 12.9  11.2 - 14.5 % Final  . lymph# 02/19/2015 0.7* 0.9 - 3.3 10e3/uL Final  . MONO# 02/19/2015 0.4  0.1 - 0.9 10e3/uL Final  . Eosinophils Absolute 02/19/2015 0.0  0.0 - 0.5 10e3/uL Final  . Basophils Absolute 02/19/2015 0.0  0.0 - 0.1 10e3/uL Final  . NEUT% 02/19/2015 63.7  38.4 - 76.8 % Final  . LYMPH% 02/19/2015 22.2  14.0 - 49.7 % Final  . MONO% 02/19/2015 13.2  0.0 - 14.0 % Final  . EOS% 02/19/2015 0.6  0.0 - 7.0 % Final  . BASO% 02/19/2015 0.3  0.0 - 2.0 % Final  . Sodium 02/19/2015 143  136 - 145 mEq/L Final  . Potassium 02/19/2015 4.2  3.5 - 5.1 mEq/L Final  . Chloride 02/19/2015 109  98 - 109 mEq/L Final  . CO2 02/19/2015 28  22 - 29 mEq/L Final  . Glucose 02/19/2015 98  70 - 140 mg/dl Final  . BUN 02/19/2015 23.0  7.0 - 26.0 mg/dL Final  . Creatinine 02/19/2015 0.8  0.6 - 1.1 mg/dL Final  . Total Bilirubin 02/19/2015 0.46  0.20 - 1.20 mg/dL Final  . Alkaline Phosphatase 02/19/2015 46  40 - 150 U/L Final  . AST 02/19/2015  15  5 - 34 U/L Final  . ALT 02/19/2015 7  0 - 55 U/L Final  . Total Protein 02/19/2015 5.4* 6.4 - 8.3 g/dL Final  . Albumin 02/19/2015 3.1* 3.5 - 5.0 g/dL Final  . Calcium 02/19/2015 8.3* 8.4 - 10.4 mg/dL Final  . Anion Gap 02/19/2015 6  3 - 11 mEq/L Final  . EGFR 02/19/2015 67* >90 ml/min/1.73 m2 Final   eGFR is calculated using the CKD-EPI Creatinine Equation (2009)  Abstract on 02/13/2015  Component Date Value Ref Range Status  . Glucose 02/12/2015 84   Final  . BUN 02/12/2015 20  4 - 21  mg/dL Final  . Creatinine 02/12/2015 0.7  0.5 - 1.1 mg/dL Final  . Potassium 02/12/2015 4.3  3.4 - 5.3 mmol/L Final  . Sodium 02/12/2015 140  137 - 147 mmol/L Final  . Triglycerides 02/12/2015 92  40 - 160 mg/dL Final  . Cholesterol 02/12/2015 178  0 - 200 mg/dL Final  . HDL 02/12/2015 77* 35 - 70 mg/dL Final  . LDL Cholesterol 02/12/2015 83   Final  . Alkaline Phosphatase 02/12/2015 42  25 - 125 U/L Final  . AST 02/12/2015 15  13 - 35 U/L Final  . Bilirubin, Total 02/12/2015 0.5   Final  Appointment on 12/21/2014  Component Date Value Ref Range Status  . WBC 12/21/2014 3.8* 3.9 - 10.3 10e3/uL Final  . NEUT# 12/21/2014 2.7  1.5 - 6.5 10e3/uL Final  . HGB 12/21/2014 11.9  11.6 - 15.9 g/dL Final  . HCT 12/21/2014 35.6  34.8 - 46.6 % Final  . Platelets 12/21/2014 259  145 - 400 10e3/uL Final  . MCV 12/21/2014 140.7* 79.5 - 101.0 fL Final  . MCH 12/21/2014 47.0* 25.1 - 34.0 pg Final  . MCHC 12/21/2014 33.4  31.5 - 36.0 g/dL Final  . RBC 12/21/2014 2.53* 3.70 - 5.45 10e6/uL Final  . RDW 12/21/2014 13.0  11.2 - 14.5 % Final  . lymph# 12/21/2014 0.7* 0.9 - 3.3 10e3/uL Final  . MONO# 12/21/2014 0.4  0.1 - 0.9 10e3/uL Final  . Eosinophils Absolute 12/21/2014 0.0  0.0 - 0.5 10e3/uL Final  . Basophils Absolute 12/21/2014 0.0  0.0 - 0.1 10e3/uL Final  . NEUT% 12/21/2014 70.6  38.4 - 76.8 % Final  . LYMPH% 12/21/2014 17.1  14.0 - 49.7 % Final  . MONO% 12/21/2014 11.0  0.0 - 14.0 % Final  . EOS%  12/21/2014 0.8  0.0 - 7.0 % Final  . BASO% 12/21/2014 0.5  0.0 - 2.0 % Final  . Sodium 12/21/2014 145  136 - 145 mEq/L Final  . Potassium 12/21/2014 4.5  3.5 - 5.1 mEq/L Final  . Chloride 12/21/2014 109  98 - 109 mEq/L Final  . CO2 12/21/2014 30* 22 - 29 mEq/L Final  . Glucose 12/21/2014 99  70 - 140 mg/dl Final  . BUN 12/21/2014 24.0  7.0 - 26.0 mg/dL Final  . Creatinine 12/21/2014 0.9  0.6 - 1.1 mg/dL Final  . Total Bilirubin 12/21/2014 0.59  0.20 - 1.20 mg/dL Final  . Alkaline Phosphatase 12/21/2014 47  40 - 150 U/L Final  . AST 12/21/2014 16  5 - 34 U/L Final  . ALT 12/21/2014 9  0 - 55 U/L Final  . Total Protein 12/21/2014 5.6* 6.4 - 8.3 g/dL Final  . Albumin 12/21/2014 3.3* 3.5 - 5.0 g/dL Final  . Calcium 12/21/2014 8.9  8.4 - 10.4 mg/dL Final  . Anion Gap 12/21/2014 7  3 - 11 mEq/L Final  . EGFR 12/21/2014 60* >90 ml/min/1.73 m2 Final   eGFR is calculated using the CKD-EPI Creatinine Equation (2009)     Assessment/Plan  1. Essential hypertension Controlled  2. Anemia, unspecified anemia type Resolved  3. Memory loss Stable  4. Loss of weight Stable  5. Hyperlipidemia Normal recent values  6. Essential thrombocythemia Normal recent lab. Continue hydroxyurea.

## 2015-03-01 ENCOUNTER — Other Ambulatory Visit: Payer: Self-pay | Admitting: Internal Medicine

## 2015-03-06 DIAGNOSIS — N3010 Interstitial cystitis (chronic) without hematuria: Secondary | ICD-10-CM | POA: Diagnosis not present

## 2015-03-06 DIAGNOSIS — N94819 Vulvodynia, unspecified: Secondary | ICD-10-CM | POA: Diagnosis not present

## 2015-04-02 DIAGNOSIS — H4011X3 Primary open-angle glaucoma, severe stage: Secondary | ICD-10-CM | POA: Diagnosis not present

## 2015-04-02 DIAGNOSIS — H26493 Other secondary cataract, bilateral: Secondary | ICD-10-CM | POA: Diagnosis not present

## 2015-04-02 DIAGNOSIS — H4011X1 Primary open-angle glaucoma, mild stage: Secondary | ICD-10-CM | POA: Diagnosis not present

## 2015-04-02 DIAGNOSIS — Z961 Presence of intraocular lens: Secondary | ICD-10-CM | POA: Diagnosis not present

## 2015-04-09 DIAGNOSIS — J392 Other diseases of pharynx: Secondary | ICD-10-CM | POA: Diagnosis not present

## 2015-04-09 DIAGNOSIS — R490 Dysphonia: Secondary | ICD-10-CM | POA: Diagnosis not present

## 2015-04-09 DIAGNOSIS — R0989 Other specified symptoms and signs involving the circulatory and respiratory systems: Secondary | ICD-10-CM | POA: Diagnosis not present

## 2015-04-21 ENCOUNTER — Other Ambulatory Visit: Payer: Self-pay | Admitting: Hematology

## 2015-04-21 DIAGNOSIS — D473 Essential (hemorrhagic) thrombocythemia: Secondary | ICD-10-CM

## 2015-05-28 ENCOUNTER — Telehealth: Payer: Self-pay | Admitting: Hematology

## 2015-05-28 ENCOUNTER — Encounter: Payer: Self-pay | Admitting: Hematology

## 2015-05-28 ENCOUNTER — Other Ambulatory Visit (HOSPITAL_BASED_OUTPATIENT_CLINIC_OR_DEPARTMENT_OTHER): Payer: Medicare Other

## 2015-05-28 ENCOUNTER — Ambulatory Visit (HOSPITAL_BASED_OUTPATIENT_CLINIC_OR_DEPARTMENT_OTHER): Payer: Medicare Other | Admitting: Hematology

## 2015-05-28 VITALS — BP 153/63 | HR 53 | Temp 98.3°F | Resp 17 | Ht 61.0 in | Wt 94.7 lb

## 2015-05-28 DIAGNOSIS — R233 Spontaneous ecchymoses: Secondary | ICD-10-CM

## 2015-05-28 DIAGNOSIS — D473 Essential (hemorrhagic) thrombocythemia: Secondary | ICD-10-CM

## 2015-05-28 LAB — COMPREHENSIVE METABOLIC PANEL (CC13)
ALK PHOS: 42 U/L (ref 40–150)
ALT: 10 U/L (ref 0–55)
AST: 18 U/L (ref 5–34)
Albumin: 3.2 g/dL — ABNORMAL LOW (ref 3.5–5.0)
Anion Gap: 6 mEq/L (ref 3–11)
BILIRUBIN TOTAL: 0.65 mg/dL (ref 0.20–1.20)
BUN: 21.6 mg/dL (ref 7.0–26.0)
CO2: 27 meq/L (ref 22–29)
Calcium: 8.7 mg/dL (ref 8.4–10.4)
Chloride: 109 mEq/L (ref 98–109)
Creatinine: 0.9 mg/dL (ref 0.6–1.1)
EGFR: 60 mL/min/{1.73_m2} — ABNORMAL LOW (ref >90)
Glucose: 93 mg/dl (ref 70–140)
Potassium: 4.3 mEq/L (ref 3.5–5.1)
Sodium: 142 mEq/L (ref 136–145)
TOTAL PROTEIN: 5.5 g/dL — AB (ref 6.4–8.3)

## 2015-05-28 LAB — CBC WITH DIFFERENTIAL/PLATELET
BASO%: 0.6 % (ref 0.0–2.0)
BASOS ABS: 0 10*3/uL (ref 0.0–0.1)
EOS ABS: 0 10*3/uL (ref 0.0–0.5)
EOS%: 0.4 % (ref 0.0–7.0)
HCT: 35 % (ref 34.8–46.6)
HEMOGLOBIN: 11.8 g/dL (ref 11.6–15.9)
LYMPH#: 0.6 10*3/uL — AB (ref 0.9–3.3)
LYMPH%: 22.9 % (ref 14.0–49.7)
MCH: 45.8 pg — AB (ref 25.1–34.0)
MCHC: 33.7 g/dL (ref 31.5–36.0)
MCV: 135.9 fL — ABNORMAL HIGH (ref 79.5–101.0)
MONO#: 0.3 10*3/uL (ref 0.1–0.9)
MONO%: 12.1 % (ref 0.0–14.0)
NEUT#: 1.7 10*3/uL (ref 1.5–6.5)
NEUT%: 64 % (ref 38.4–76.8)
Platelets: 345 10*3/uL (ref 145–400)
RBC: 2.57 10*6/uL — AB (ref 3.70–5.45)
RDW: 14.6 % — AB (ref 11.2–14.5)
WBC: 2.7 10*3/uL — ABNORMAL LOW (ref 3.9–10.3)

## 2015-05-28 MED ORDER — HYDROXYUREA 500 MG PO CAPS: ORAL_CAPSULE | ORAL | Status: DC

## 2015-05-28 NOTE — Progress Notes (Signed)
Hematology and Oncology Follow Up Visit Date of Visit: 05/28/2015   [name] [name] [id number] 07-03-1923 81 y.o. GREEN, [name], MD  Chief Complaint: Essential Thrombocytosis.  Principle Diagnosis: Essential thrombocytosis, intitiated on Hydrea on 07/05/2011; JAK2 mutation (-), BCR-ABL(-)   Previous therapy: Initially the patient was on Hydrea 1500 mg QOD and 1000 mg QOD, with significant drop in platelet count to 60,000 and neutropenia,   Current therapy:  Hydrea 1,000 mg 5 days Monday through Friday and 500 mg on Weekends Saturday/Sunday. Aspirin 81 mg also for thromboprophylaxis.  Interim History:  Ms. [name] [name] returns to the office for follow-up. Today, she notes on some bruising right forearm that started about five days ago. She cannot recall an inciting trigger but feels it might be related to age changes. She denies any fatigue and feels good for her age. She lives at [facility] where she is allowed to prepare 2 of her 3 meals. No ED or hospital visits since then. She reports taking her Hydrea and ASA regularly.   Medications: I have reviewed the patient's current medications.  Current Outpatient Prescriptions  Medication Sig Dispense Refill  . amoxicillin (AMOXIL) 500 MG capsule Take 500 mg by mouth as needed (Takes prior to dental appt.).   4  . aspirin 81 MG tablet Take 81 mg by mouth daily.    Marland Kitchen atenolol (TENORMIN) 50 MG tablet Take by mouth. Take 1/2 tablet twice daily for blood pressure    . calcitonin, salmon, (MIACALCIN/FORTICAL) 200 UNIT/ACT nasal spray USE 1 SPRAY IN ALTERNATING NOSTRILS ONCE A DAY FOR BONES 3.7 mL 3  . Calcium-Vitamin D-Vitamin K (VIACTIV) 782-423-53 MG-UNT-MCG CHEW Chew 1 each by mouth daily.    . dorzolamide-timolol (COSOPT) 22.3-6.8 MG/ML ophthalmic solution Place 1 drop into both eyes 2 (two) times daily.     . flurbiprofen (ANSAID) 100 MG tablet TAKE 1 TABLET BY MOUTH EVERY DAY 90 tablet 1  . Glucosamine-Chondroit-Vit C-Mn (GLUCOSAMINE CHONDR 1500  COMPLX) CAPS Take 2 capsules by mouth daily.     . hydroxyurea (HYDREA) 500 MG capsule take 2 capsules by mouth Monday through  Friday  and 1 capsule on sat/sunday (weekend) 54 capsule 5  . hydroxyurea (HYDREA) 500 MG capsule TAKE 2 CAPSULES BY MOUTH MONDAY THROUGH FRIDAY. TAKE 1 CAPSULE BY MOUTH SATURDAY AND SUNDAY. 100 capsule 0  . latanoprost (XALATAN) 0.005 % ophthalmic solution Place 1 drop into both eyes daily.    . Magnesium 250 MG TABS Take by mouth. Take one tablet daily to help with constipation    . URELLE (URELLE/URISED) 81 MG TABS Take 1 tablet by mouth daily. Take one tablet once a day for bladder    . vitamin E 400 UNIT capsule Take 400 Units by mouth every other day.     No current facility-administered medications for this visit.   Allergies:  Allergies  Allergen Reactions  . Darvocet [Propoxyphene N-Acetaminophen] Nausea And Vomiting  . Darvon Nausea And Vomiting  . Meperidine And Related Nausea And Vomiting  . Vicodin [Hydrocodone-Acetaminophen] Nausea And Vomiting   Past Medical History, Surgical history, Social history, and Family History were reviewed and updated.  Review of Systems: Constitutional:  Negative for fever, chills, night sweats, anorexia, weight loss, pain. Cardiovascular: no chest pain or dyspnea on exertion Respiratory: no cough, shortness of breath, or wheezing Neurological: no TIA or stroke symptoms Dermatological: negative for rash ENT: negative for - epistaxis or headaches Skin: Negative. Gastrointestinal: no abdominal pain, change in bowel habits, or black or bloody  stools Genito-Urinary: no dysuria, trouble voiding, or hematuria Hematological and Lymphatic: negative for - bleeding problems or blood clots Breast: negative for breast lumps Musculoskeletal: negative for - gait disturbance Remaining ROS negative.  Physical Exam: Blood pressure 153/63, pulse 53, temperature 98.3 F (36.8 C), temperature source Oral, resp. rate 17, height 5'  1" (1.549 m), weight 94 lb 11.2 oz (42.956 kg), SpO2 99 %. ECOG: 0 General appearance: alert, cooperative, appears stated age and no distress Head: Normocephalic, without obvious abnormality, atraumatic Neck: no adenopathy, supple, symmetrical, trachea midline and thyroid not enlarged, symmetric, no tenderness/mass/nodules HEENT: OP clear; PERRL; EOMi.  Lymph nodes: Cervical, supraclavicular, and axillary nodes normal. Heart:regular rate and rhythm, S1, S2 normal, no murmur, click, rub or gallop Lung:chest clear, no wheezing, rales, normal symmetric air entry Abdomin: soft, non-tender, without masses or organomegaly EXT:No peripheral edema   Lab Results: CBC Latest Ref Rng 05/28/2015 02/19/2015 12/21/2014  WBC 3.9 - 10.3 10e3/uL 2.7(L) 3.3(L) 3.8(L)  Hemoglobin 11.6 - 15.9 g/dL 11.8 12.0 11.9  Hematocrit 34.8 - 46.6 % 35.0 35.6 35.6  Platelets 145 - 400 10e3/uL 345 310 259    CMP Latest Ref Rng 05/28/2015 02/19/2015 02/12/2015  Glucose 70 - 140 mg/dl 93 98 -  BUN 7.0 - 26.0 mg/dL 21.6 23.0 20  Creatinine 0.6 - 1.1 mg/dL 0.9 0.8 0.7  Sodium 136 - 145 mEq/L 142 143 140  Potassium 3.5 - 5.1 mEq/L 4.3 4.2 4.3  Chloride 97 - 108 mmol/L - - -  CO2 22 - 29 mEq/L 27 28 -  Calcium 8.4 - 10.4 mg/dL 8.7 8.3(L) -  Total Protein 6.4 - 8.3 g/dL 5.5(L) 5.4(L) -  Total Bilirubin 0.20 - 1.20 mg/dL 0.65 0.46 -  Alkaline Phos 40 - 150 U/L 42 46 42  AST 5 - 34 U/L 18 15 15   ALT 0 - 55 U/L 10 7 -       Impression and Plan:  1. Essential thrombocythemia -- [name] [name] is an 81 year old woman with essential thrombocytosis, currently on Hydrea, tolerating this current dosage well. MCV high meaning she is compliant.   -We reviewed the natural history of on essential thrombocythemia and the main complications which is venous and arterial thrombosis -- We reviewed her labs, her plt has been adequately controlled. -Mild anemia and it leukocytopenia likely secondary to Hydrea, no neutropenia blood counts  are stable.  -We'll continue the current Hydrea dose  -Continue ASA  2. Skin ecchymosis on right forearm  -Likely related to her aspirin. I suggest her to hold aspirin for 3-5 days, but she wanted to continue.  2. She will continue follow up with her PCP for other medical issues.    Plan:   -continue hydrea 1000mg  M-F and 500mg  on weekend -RTC in 4 month with repeated lab   Truitt Merle  05/28/2015  05/28/2015

## 2015-05-28 NOTE — Telephone Encounter (Signed)
per pof to sch pt appt-gave pt copy of avs °

## 2015-06-15 ENCOUNTER — Other Ambulatory Visit: Payer: Self-pay | Admitting: Hematology

## 2015-07-03 DIAGNOSIS — Z23 Encounter for immunization: Secondary | ICD-10-CM | POA: Diagnosis not present

## 2015-08-08 ENCOUNTER — Other Ambulatory Visit: Payer: Self-pay | Admitting: Hematology

## 2015-08-10 ENCOUNTER — Other Ambulatory Visit: Payer: Self-pay | Admitting: Hematology

## 2015-08-15 DIAGNOSIS — D649 Anemia, unspecified: Secondary | ICD-10-CM | POA: Diagnosis not present

## 2015-08-15 DIAGNOSIS — I10 Essential (primary) hypertension: Secondary | ICD-10-CM | POA: Diagnosis not present

## 2015-08-15 LAB — HEPATIC FUNCTION PANEL
ALT: 6 U/L — AB (ref 7–35)
AST: 14 U/L (ref 13–35)
Alkaline Phosphatase: 41 U/L (ref 25–125)
Bilirubin, Total: 0.6 mg/dL

## 2015-08-15 LAB — BASIC METABOLIC PANEL
BUN: 21 mg/dL (ref 4–21)
CREATININE: 0.9 mg/dL (ref 0.5–1.1)
Glucose: 88 mg/dL
Potassium: 4.6 mmol/L (ref 3.4–5.3)
Sodium: 140 mmol/L (ref 137–147)

## 2015-08-15 LAB — CBC AND DIFFERENTIAL
HCT: 35 % — AB (ref 36–46)
Hemoglobin: 12.2 g/dL (ref 12.0–16.0)
Platelets: 312 10*3/uL (ref 150–399)
WBC: 3.1 10*3/mL

## 2015-08-16 ENCOUNTER — Encounter: Payer: Self-pay | Admitting: *Deleted

## 2015-08-22 ENCOUNTER — Non-Acute Institutional Stay: Payer: Medicare Other | Admitting: Internal Medicine

## 2015-08-22 ENCOUNTER — Encounter: Payer: Self-pay | Admitting: Internal Medicine

## 2015-08-22 VITALS — BP 132/82 | HR 60 | Temp 97.7°F | Ht 61.0 in | Wt 98.0 lb

## 2015-08-22 DIAGNOSIS — R634 Abnormal weight loss: Secondary | ICD-10-CM | POA: Diagnosis not present

## 2015-08-22 DIAGNOSIS — E785 Hyperlipidemia, unspecified: Secondary | ICD-10-CM | POA: Diagnosis not present

## 2015-08-22 DIAGNOSIS — D473 Essential (hemorrhagic) thrombocythemia: Secondary | ICD-10-CM | POA: Diagnosis not present

## 2015-08-22 DIAGNOSIS — R413 Other amnesia: Secondary | ICD-10-CM | POA: Diagnosis not present

## 2015-08-22 DIAGNOSIS — I10 Essential (primary) hypertension: Secondary | ICD-10-CM | POA: Diagnosis not present

## 2015-08-22 DIAGNOSIS — D649 Anemia, unspecified: Secondary | ICD-10-CM | POA: Diagnosis not present

## 2015-08-22 NOTE — Progress Notes (Deleted)
Patient ID: [name] [name], female   DOB: 06-18-1923, 81 y.o.   MRN: [id number]    [facility] (12[facility]     Allergies  Allergen Reactions  . Darvocet [Propoxyphene N-Acetaminophen] Nausea And Vomiting  . Darvon Nausea And Vomiting  . Meperidine And Related Nausea And Vomiting  . Vicodin [Hydrocodone-Acetaminophen] Nausea And Vomiting    Chief Complaint  Patient presents with  . Annual Exam    Comprehensive exam  . Medical Management of Chronic Issues    blood pressure, anemia, memory, weight lost    HPI:  ***  Medications: Patient's Medications  New Prescriptions   No medications on file  Previous Medications   AMOXICILLIN (AMOXIL) 500 MG CAPSULE    Take 500 mg by mouth as needed (Takes prior to dental appt.).    ASPIRIN 81 MG TABLET    Take 81 mg by mouth daily.   ATENOLOL (TENORMIN) 50 MG TABLET    Take by mouth. Take 1/2 tablet twice daily for blood pressure   CALCITONIN, SALMON, (MIACALCIN/FORTICAL) 200 UNIT/ACT NASAL SPRAY    USE 1 SPRAY IN ALTERNATING NOSTRILS ONCE A DAY FOR BONES   CALCIUM-VITAMIN D-VITAMIN K (VIACTIV) 500-938-18 MG-UNT-MCG CHEW    Chew 1 each by mouth daily.   DORZOLAMIDE-TIMOLOL (COSOPT) 22.3-6.8 MG/ML OPHTHALMIC SOLUTION    Place 1 drop into both eyes 2 (two) times daily.    FLURBIPROFEN (ANSAID) 100 MG TABLET    TAKE 1 TABLET BY MOUTH EVERY DAY   HYDROXYUREA (HYDREA) 500 MG CAPSULE    TAKE 2 CAPSULES BY MOUTH MONDAY THROUGH FRIDAY. TAKE 1 CAPSULE BY MOUTH SATURDAY AND SUNDAY.   LATANOPROST (XALATAN) 0.005 % OPHTHALMIC SOLUTION    Place 1 drop into both eyes daily.   MAGNESIUM 250 MG TABS    Take by mouth. Take one tablet daily to help with constipation   URELLE (URELLE/URISED) 81 MG TABS    Take 1 tablet by mouth daily. Take one tablet once a day for bladder   VITAMIN E 400 UNIT CAPSULE    Take 400 Units by mouth every other day.  Modified Medications   No medications on file  Discontinued Medications    GLUCOSAMINE-CHONDROIT-VIT C-MN (GLUCOSAMINE CHONDR 1500 COMPLX) CAPS    Take 2 capsules by mouth daily.      Review of Systems  Constitutional: Negative for fever, diaphoresis, activity change, appetite change, fatigue and unexpected weight change.  HENT: Positive for voice change (hoarse since September 2014).   Eyes: Negative.   Respiratory: Negative.   Cardiovascular: Negative.   Gastrointestinal: Negative.   Endocrine: Negative.   Genitourinary: Negative.   Musculoskeletal: Negative.   Neurological: Negative.   Hematological:       Thrombocythemia  Psychiatric/Behavioral: Negative.     Filed Vitals:   08/22/15 1616  BP: 132/82  Pulse: 60  Temp: 97.7 F (36.5 C)  TempSrc: Oral  Height: '5\' 1"'  (1.549 m)  Weight: 98 lb (44.453 kg)  SpO2: 96%   Body mass index is 18.53 kg/(m^2).  Physical Exam  Constitutional: She is oriented to person, place, and time. She appears well-developed and well-nourished. No distress.  HENT:  Head: Normocephalic and atraumatic.  Right Ear: External ear normal.  Left Ear: External ear normal.  Nose: Nose normal.  Mouth/Throat: Oropharynx is clear and moist.  Eyes: Conjunctivae and EOM are normal. Pupils are equal, round, and reactive to light.  Corrective lenses  Neck: Neck supple. No JVD present.  No tracheal deviation present. No thyromegaly present.  Cardiovascular: Normal rate and regular rhythm.  Exam reveals no gallop and no friction rub.   Murmur (2/6/LSB SEM) heard. Pulmonary/Chest: No respiratory distress. She has no wheezes. She has no rales.  Abdominal: She exhibits no distension and no mass. There is no tenderness.  Musculoskeletal: She exhibits no edema or tenderness.  Heberden's nodes. Bouchard's nodes. Contracture of the right hand.  Lymphadenopathy:    She has no cervical adenopathy.  Neurological: She is alert and oriented to person, place, and time. She has normal reflexes. No cranial nerve deficit. Coordination normal.   Skin: No rash noted. No erythema. No pallor.  Psychiatric: She has a normal mood and affect. Her behavior is normal. Judgment and thought content normal.     Labs reviewed: Lab Summary Latest Ref Rng 08/15/2015 05/28/2015 02/19/2015  Hemoglobin 12.0 - 16.0 g/dL 12.2 11.8 12.0  Hematocrit 36 - 46 % 35(A) 35.0 35.6  White count - 3.1 2.7(L) 3.3(L)  Platelet count 150 - 399 K/L 312 345 310  Sodium 137 - 147 mmol/L 140 142 143  Potassium 3.4 - 5.3 mmol/L 4.6 4.3 4.2  Calcium 8.4 - 10.4 mg/dL (None) 8.7 8.3(L)  Phosphorus - (None) (None) (None)  Creatinine 0.5 - 1.1 mg/dL 0.9 0.9 0.8  AST 13 - 35 U/L '14 18 15  ' Alk Phos 25 - 125 U/L 41 42 46  Bilirubin 0.20 - 1.20 mg/dL (None) 0.65 0.46  Glucose - 88 93 98  Cholesterol - (None) (None) (None)  HDL cholesterol - (None) (None) (None)  Triglycerides - (None) (None) (None)  LDL Direct - (None) (None) (None)  LDL Calc - (None) (None) (None)  Total protein 6.4 - 8.3 g/dL (None) 5.5(L) 5.4(L)  Albumin 3.5 - 5.0 g/dL (None) 3.2(L) 3.1(L)   No results found for: TSH, T3TOTAL, T4TOTAL, THYROIDAB Lab Results  Component Value Date   BUN 21 08/15/2015   No results found for: HGBA1C     Assessment/Plan

## 2015-08-22 NOTE — Progress Notes (Signed)
Patient ID: [name] [name], female   DOB: 12/04/1922, 81 y.o.   MRN: [id number]    HISTORY AND PHYSICAL  Location:  [place] of Service: Clinic (12)   Extended Emergency Contact Information Primary Emergency Contact: Hurley,Ray  [place] Phone: [phone] Relation: Friend  Advanced Directive information Does patient have an advance directive?: Yes, Type of Advance Directive: Healthcare Power of Dukedom;Living will  Chief Complaint  Patient presents with  . Annual Exam    Comprehensive exam  . Medical Management of Chronic Issues    blood pressure, anemia, memory, weight lost    HPI:  Anemia, unspecified anemia type - resolved  Essential thrombocythemia ([place]) - well controlled with the use of hydroxyurea. Last platelet count 312,000  Hyperlipidemia - controlled  Essential hypertension - controlled  Loss of weight - stable  Memory loss - patient is aware of some memory issues. It does not seem to interfere with day-to-day events.    Past Medical History  Diagnosis Date  . Glaucoma   . Hypertension   . Arthritis   . Neck fracture ([place])   . Abdominal pain, unspecified site 08/11/2012  . Essential thrombocythemia ([place]) 06/18/2011  . Anemia, unspecified 06/18/2011  . 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 Loss of weight 06/18/2011  . Lumbago 12/18/2010  . Sciatica 06/19/2010  . Memory loss 12/19/2009  . Diverticulosis of colon (without mention of hemorrhage) 04/20/2007  . Disorder of bone and cartilage, unspecified 04/20/2007  . Hemorrhage of gastrointestinal tract, unspecified 03/09/2007  . Other and unspecified hyperlipidemia 09/01/2006  . BBB (bundle branch block) 03/03/2006  . Cardiac dysrhythmia, unspecified 03/03/2006  . Chronic interstitial cystitis 03/03/2006  . Osteoarthrosis, unspecified whether generalized or localized, unspecified site 03/03/2006  . Hematuria, unspecified 04/22/2004  . Herpes zoster without mention of  complication 8/33/8250  . 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 Appendectomy    . Tonsillectomy    . Abdominal hysterectomy    . Cataract extraction w/ intraocular lens  implant, bilateral    . Breast cyst excision Bilateral     benign    Patient Care Team: Estill Dooms, MD as PCP - General (Internal Medicine) [place] Man Mast X, NP as Nurse Practitioner (Nurse Practitioner)  Social History   Social History  . Marital Status: Widowed    Spouse Name: N/A  . Number of Children: N/A  . Years of Education: N/A   Occupational History  . Not on file.   Social History Main Topics  . Smoking status: Never Smoker   . Smokeless tobacco: Never Used  . Alcohol Use: No  . Drug Use: No  . Sexual Activity: No   Other Topics Concern  . 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 She has never used smokeless tobacco. She reports that she does not drink alcohol or use illicit drugs.  Family History  Problem Relation Age of Onset  . Heart disease Father     MI   Family Status  Relation Status Death Age  . Mother Deceased 19    killed when plane hit home  . Father Deceased 56  . Sister Deceased  killed when plane hit home  . Brother Deceased     killed when plane hit home  . Son Alive   . Son Alive     Immunization History  Administered Date(s) Administered  . Influenza Whole 08/09/2012, 07/10/2013  . Influenza-Unspecified 08/14/2014, 08/08/2015  . Pneumococcal Polysaccharide-23 11/09/2004  . Zoster 10/07/2006    Allergies  Allergen Reactions  . Darvocet [Propoxyphene N-Acetaminophen] Nausea And Vomiting  . Darvon Nausea And Vomiting  . Meperidine And Related Nausea And Vomiting  . Vicodin [Hydrocodone-Acetaminophen] Nausea  And Vomiting    Medications: Patient's Medications  New Prescriptions   No medications on file  Previous Medications   AMOXICILLIN (AMOXIL) 500 MG CAPSULE    Take 500 mg by mouth as needed (Takes prior to dental appt.).    ASPIRIN 81 MG TABLET    Take 81 mg by mouth daily.   ATENOLOL (TENORMIN) 50 MG TABLET    Take by mouth. Take 1/2 tablet twice daily for blood pressure   CALCITONIN, SALMON, (MIACALCIN/FORTICAL) 200 UNIT/ACT NASAL SPRAY    USE 1 SPRAY IN ALTERNATING NOSTRILS ONCE A DAY FOR BONES   CALCIUM-VITAMIN D-VITAMIN K (VIACTIV) 974-163-84 MG-UNT-MCG CHEW    Chew 1 each by mouth daily.   DORZOLAMIDE-TIMOLOL (COSOPT) 22.3-6.8 MG/ML OPHTHALMIC SOLUTION    Place 1 drop into both eyes 2 (two) times daily.    FLURBIPROFEN (ANSAID) 100 MG TABLET    TAKE 1 TABLET BY MOUTH EVERY DAY   HYDROXYUREA (HYDREA) 500 MG CAPSULE    TAKE 2 CAPSULES BY MOUTH MONDAY THROUGH FRIDAY. TAKE 1 CAPSULE BY MOUTH SATURDAY AND SUNDAY.   LATANOPROST (XALATAN) 0.005 % OPHTHALMIC SOLUTION    Place 1 drop into both eyes daily.   MAGNESIUM 250 MG TABS    Take by mouth. Take one tablet daily to help with constipation   URELLE (URELLE/URISED) 81 MG TABS    Take 1 tablet by mouth daily. Take one tablet once a day for bladder   VITAMIN E 400 UNIT CAPSULE    Take 400 Units by mouth every other day.  Modified Medications   No medications on file  Discontinued Medications   GLUCOSAMINE-CHONDROIT-VIT C-MN (GLUCOSAMINE CHONDR 1500 COMPLX) CAPS    Take 2 capsules by mouth daily.     Review of Systems  Constitutional: Negative for fever, diaphoresis, activity change, appetite change, fatigue and unexpected weight change.  HENT: Positive for voice change (hoarse since September 2014).   Eyes: Negative.   Respiratory: Negative.   Cardiovascular: Negative.   Gastrointestinal: Negative.   Endocrine: Negative.   Genitourinary: Negative.   Musculoskeletal: Negative.   Neurological:       Memory loss  Hematological:        Thrombocythemia  Psychiatric/Behavioral: Negative.     Filed Vitals:   08/22/15 1616  BP: 132/82  Pulse: 60  Temp: 97.7 F (36.5 C)  TempSrc: Oral  Height: $Remove'5\' 1"'qQWVBtC$  (1.549 m)  Weight: 98 lb (44.453 kg)  SpO2: 96%   Body mass index is 18.53 kg/(m^2).  Physical Exam  Constitutional: She is oriented to person, place, and time. She appears well-developed and well-nourished. No distress.  HENT:  Head: Normocephalic and atraumatic.  Right Ear: External ear normal.  Left Ear: External ear normal.  Nose: Nose normal.  Mouth/Throat: Oropharynx is clear and moist.  Eyes: Conjunctivae and EOM are normal. Pupils are equal, round, and reactive to light.  Corrective lenses  Neck: Neck supple. No JVD present. No tracheal deviation present. No thyromegaly  present.  Cardiovascular: Normal rate and regular rhythm.  Exam reveals no gallop and no friction rub.   Murmur (2/6/LSB SEM) heard. Pulmonary/Chest: No respiratory distress. She has no wheezes. She has no rales.  Abdominal: She exhibits no distension and no mass. There is no tenderness.  Musculoskeletal: She exhibits no edema or tenderness.  Heberden's nodes. Bouchard's nodes. Contracture of the right hand.  Lymphadenopathy:    She has no cervical adenopathy.  Neurological: She is alert and oriented to person, place, and time. She has normal reflexes. No cranial nerve deficit. Coordination normal.  08/22/2015 MMSE 28/30. Failed clock drawing.  Skin: No rash noted. No erythema. No pallor.  Psychiatric: She has a normal mood and affect. Her behavior is normal. Judgment and thought content normal.    Labs reviewed: Lab Summary Latest Ref Rng 08/15/2015 05/28/2015 02/19/2015  Hemoglobin 12.0 - 16.0 g/dL 12.2 11.8 12.0  Hematocrit 36 - 46 % 35(A) 35.0 35.6  White count - 3.1 2.7(L) 3.3(L)  Platelet count 150 - 399 K/L 312 345 310  Sodium 137 - 147 mmol/L 140 142 143  Potassium 3.4 - 5.3 mmol/L 4.6 4.3 4.2  Calcium 8.4 - 10.4 mg/dL (None)  8.7 8.3(L)  Phosphorus - (None) (None) (None)  Creatinine 0.5 - 1.1 mg/dL 0.9 0.9 0.8  AST 13 - 35 U/L _0 Alk Phos 25 - 125 U/L 41 42 46  Bilirubin 0.20 - 1.20 mg/dL (None) 0.65 0.46  Glucose - 88 93 98  Cholesterol - (None) (None) (None)  HDL cholesterol - (None) (None) (None)  Triglycerides - (None) (None) (None)  LDL Direct - (None) (None) (None)  LDL Calc - (None) (None) (None)  Total protein 6.4 - 8.3 g/dL (None) 5.5(L) 5.4(L)  Albumin 3.5 - 5.0 g/dL (None) 3.2(L) 3.1(L)   Lab Results  Component Value Date   BUN 21 08/15/2015    Assessment/Plan  1. Anemia, unspecified anemia type Improved  2. Essential thrombocythemia ([place]) Controlled with hydroxyurea  3. Hyperlipidemia Controlled  4. Essential hypertension Controlled  5. Loss of weight Stable  6. Memory loss Repeat MMSE in 6 months

## 2015-08-22 NOTE — Progress Notes (Signed)
Failed clock drawing  

## 2015-09-05 ENCOUNTER — Encounter: Payer: Self-pay | Admitting: Internal Medicine

## 2015-09-07 ENCOUNTER — Other Ambulatory Visit: Payer: Self-pay | Admitting: Internal Medicine

## 2015-09-12 ENCOUNTER — Other Ambulatory Visit: Payer: Self-pay | Admitting: Internal Medicine

## 2015-09-30 ENCOUNTER — Telehealth: Payer: Self-pay | Admitting: Hematology

## 2015-09-30 ENCOUNTER — Other Ambulatory Visit (HOSPITAL_BASED_OUTPATIENT_CLINIC_OR_DEPARTMENT_OTHER): Payer: Medicare Other

## 2015-09-30 ENCOUNTER — Encounter: Payer: Self-pay | Admitting: Hematology

## 2015-09-30 ENCOUNTER — Ambulatory Visit (HOSPITAL_BASED_OUTPATIENT_CLINIC_OR_DEPARTMENT_OTHER): Payer: Medicare Other | Admitting: Hematology

## 2015-09-30 VITALS — BP 136/52 | HR 53 | Temp 97.6°F | Resp 18 | Ht 61.0 in | Wt 98.3 lb

## 2015-09-30 DIAGNOSIS — D473 Essential (hemorrhagic) thrombocythemia: Secondary | ICD-10-CM

## 2015-09-30 LAB — CBC WITH DIFFERENTIAL/PLATELET
BASO%: 1.2 % (ref 0.0–2.0)
Basophils Absolute: 0 10*3/uL (ref 0.0–0.1)
EOS%: 1 % (ref 0.0–7.0)
Eosinophils Absolute: 0 10*3/uL (ref 0.0–0.5)
HCT: 35 % (ref 34.8–46.6)
HGB: 12.1 g/dL (ref 11.6–15.9)
LYMPH%: 24.7 % (ref 14.0–49.7)
MCH: 47.5 pg — ABNORMAL HIGH (ref 25.1–34.0)
MCHC: 34.5 g/dL (ref 31.5–36.0)
MCV: 137.7 fL — ABNORMAL HIGH (ref 79.5–101.0)
MONO#: 0.3 10*3/uL (ref 0.1–0.9)
MONO%: 16.1 % — AB (ref 0.0–14.0)
NEUT%: 57 % (ref 38.4–76.8)
NEUTROS ABS: 1.1 10*3/uL — AB (ref 1.5–6.5)
PLATELETS: 272 10*3/uL (ref 145–400)
RBC: 2.54 10*6/uL — AB (ref 3.70–5.45)
RDW: 14.7 % — ABNORMAL HIGH (ref 11.2–14.5)
WBC: 1.9 10*3/uL — AB (ref 3.9–10.3)
lymph#: 0.5 10*3/uL — ABNORMAL LOW (ref 0.9–3.3)

## 2015-09-30 LAB — COMPREHENSIVE METABOLIC PANEL (CC13)
ALK PHOS: 45 U/L (ref 40–150)
ANION GAP: 7 meq/L (ref 3–11)
AST: 16 U/L (ref 5–34)
Albumin: 3.4 g/dL — ABNORMAL LOW (ref 3.5–5.0)
BUN: 21 mg/dL (ref 7.0–26.0)
CO2: 26 meq/L (ref 22–29)
Calcium: 8.8 mg/dL (ref 8.4–10.4)
Chloride: 108 mEq/L (ref 98–109)
Creatinine: 0.8 mg/dL (ref 0.6–1.1)
EGFR: 63 mL/min/{1.73_m2} — ABNORMAL LOW (ref >90)
GLUCOSE: 95 mg/dL (ref 70–140)
POTASSIUM: 4.1 meq/L (ref 3.5–5.1)
SODIUM: 141 meq/L (ref 136–145)
Total Bilirubin: 0.71 mg/dL (ref 0.20–1.20)
Total Protein: 5.8 g/dL — ABNORMAL LOW (ref 6.4–8.3)

## 2015-09-30 MED ORDER — HYDROXYUREA 500 MG PO CAPS: ORAL_CAPSULE | ORAL | Status: DC

## 2015-09-30 NOTE — Progress Notes (Signed)
Hematology and Oncology Follow Up Visit Date of Visit: 09/30/2015   Joan Hogan [id number] [birth year] 81 y.o. GREEN, [name], MD  Chief Complaint: Essential Thrombocytosis.  Principle Diagnosis: Essential thrombocytosis, intitiated on Hydrea on 07/05/2011; JAK2 mutation (-), BCR-ABL(-)   Previous therapy: Initially the patient was on Hydrea 1500 mg QOD and 1000 mg QOD, with significant drop in platelet count to 60,000 and neutropenia,   Current therapy:  Hydrea 1,000 mg 5 days Monday through Friday and 500 mg on Saturday/Sunday. Aspirin 81 mg also for thromboprophylaxis.  Interim History:  Ms. Righi returns to the office for follow-up. Today, she is doing very well. She has been compliant with Hydrea, and reports no side effects. She denies any fever, chill, easy bruising or episodes of bleeding. She has good appetite and energy level, functions were very well at home.  Medications: I have reviewed the patient's current medications.  Current Outpatient Prescriptions  Medication Sig Dispense Refill  . aspirin 81 MG tablet Take 81 mg by mouth daily.    Marland Kitchen atenolol (TENORMIN) 50 MG tablet Take by mouth. Take 1/2 tablet twice daily for blood pressure    . calcitonin, salmon, (MIACALCIN/FORTICAL) 200 UNIT/ACT nasal spray USE 1 SPRAY IN ALTERNATING NOSTRILS ONCE A DAY FOR BONES 3.7 mL 11  . Calcium-Vitamin D-Vitamin K (VIACTIV) [id number] MG-UNT-MCG CHEW Chew 1 each by mouth daily.    . dorzolamide-timolol (COSOPT) 22.3-6.8 MG/ML ophthalmic solution Place 1 drop into both eyes 2 (two) times daily.     . flurbiprofen (ANSAID) 100 MG tablet TAKE 1 TABLET BY MOUTH EVERY DAY 90 tablet 2  . hydroxyurea (HYDREA) 500 MG capsule TAKE 2 CAPSULES BY MOUTH MONDAY THROUGH FRIDAY. TAKE 1 CAPSULE BY MOUTH SATURDAY AND SUNDAY. 100 capsule 0  . latanoprost (XALATAN) 0.005 % ophthalmic solution Place 1 drop into both eyes daily.    . Magnesium 250 MG TABS Take by mouth. Take one tablet daily to help with  constipation    . URELLE (URELLE/URISED) 81 MG TABS Take 1 tablet by mouth daily. Take one tablet once a day for bladder    . vitamin E 400 UNIT capsule Take 400 Units by mouth every other day.    Marland Kitchen amoxicillin (AMOXIL) 500 MG capsule Take 500 mg by mouth as needed (Takes prior to dental appt.).   4   No current facility-administered medications for this visit.   Allergies:  Allergies  Allergen Reactions  . Darvocet [Propoxyphene N-Acetaminophen] Nausea And Vomiting  . Darvon Nausea And Vomiting  . Meperidine And Related Nausea And Vomiting  . Vicodin [Hydrocodone-Acetaminophen] Nausea And Vomiting   Past Medical History, Surgical history, Social history, and Family History were reviewed and updated.  Review of Systems: Constitutional:  Negative for fever, chills, night sweats, anorexia, weight loss, pain. Cardiovascular: no chest pain or dyspnea on exertion Respiratory: no cough, shortness of breath, or wheezing Neurological: no TIA or stroke symptoms Dermatological: negative for rash ENT: negative for - epistaxis or headaches Skin: Negative. Gastrointestinal: no abdominal pain, change in bowel habits, or black or bloody stools Genito-Urinary: no dysuria, trouble voiding, or hematuria Hematological and Lymphatic: negative for - bleeding problems or blood clots Breast: negative for breast lumps Musculoskeletal: negative for - gait disturbance Remaining ROS negative.  Physical Exam: Blood pressure 136/52, pulse 53, temperature 97.6 F (36.4 C), temperature source Oral, resp. rate 18, height 5\' 1"  (1.549 m), weight 98 lb 4.8 oz (44.589 kg), SpO2 99 %. ECOG: 0 General appearance: alert, cooperative, appears stated  age and no distress Head: Normocephalic, without obvious abnormality, atraumatic Neck: no adenopathy, supple, symmetrical, trachea midline and thyroid not enlarged, symmetric, no tenderness/mass/nodules HEENT: OP clear; PERRL; EOMi.  Lymph nodes: Cervical,  supraclavicular, and axillary nodes normal. Heart:regular rate and rhythm, S1, S2 normal, no murmur, click, rub or gallop Lung:chest clear, no wheezing, rales, normal symmetric air entry Abdomin: soft, non-tender, without masses or organomegaly EXT:No peripheral edema   Lab Results: CBC Latest Ref Rng 09/30/2015 08/15/2015 05/28/2015  WBC 3.9 - 10.3 10e3/uL 1.9(L) 3.1 2.7(L)  Hemoglobin 11.6 - 15.9 g/dL 12.1 12.2 11.8  Hematocrit 34.8 - 46.6 % 35.0 35(A) 35.0  Platelets 145 - 400 10e3/uL 272 312 345    CMP Latest Ref Rng 09/30/2015 08/15/2015 05/28/2015  Glucose 70 - 140 mg/dl 95 - 93  BUN 7.0 - 26.0 mg/dL 21.0 21 21.6  Creatinine 0.6 - 1.1 mg/dL 0.8 0.9 0.9  Sodium 136 - 145 mEq/L 141 140 142  Potassium 3.5 - 5.1 mEq/L 4.1 4.6 4.3  Chloride 97 - 108 mmol/L - - -  CO2 22 - 29 mEq/L 26 - 27  Calcium 8.4 - 10.4 mg/dL 8.8 - 8.7  Total Protein 6.4 - 8.3 g/dL 5.8(L) - 5.5(L)  Total Bilirubin 0.20 - 1.20 mg/dL 0.71 - 0.65  Alkaline Phos 40 - 150 U/L 45 41 42  AST 5 - 34 U/L 16 14 18   ALT 0 - 55 U/L <9 6(A) 10   ANC 1.1 today     Impression and Plan:  1. Essential thrombocythemia -- [name] is an 81 year old woman with essential thrombocytosis, currently on Hydrea, tolerating this current dosage well.  -We reviewed the natural history of on essential thrombocythemia and the main complications which is venous and arterial thrombosis -- We reviewed her labs, her plt has been adequately controlled, but her Neutropenia is slightly worse, ANC 1.1 today. -I'll decrease her Hydrea dose to 500 mg Monday, Wednesday and Friday, and 1000 mg daily on the rest of week -Continue ASA  2. She will continue follow up with her PCP for other medical issues.    Plan:   -change hydrea to 500mg  daily on MWF, and 1000mg  on other days  -CBC monthly, I'll see her back in 2 months   Truitt Merle  09/30/2015

## 2015-09-30 NOTE — Telephone Encounter (Signed)
Gave patient avs report and appointments for December and January  °

## 2015-10-09 DIAGNOSIS — H02005 Unspecified entropion of left lower eyelid: Secondary | ICD-10-CM | POA: Diagnosis not present

## 2015-10-09 DIAGNOSIS — H401123 Primary open-angle glaucoma, left eye, severe stage: Secondary | ICD-10-CM | POA: Diagnosis not present

## 2015-10-09 DIAGNOSIS — H1851 Endothelial corneal dystrophy: Secondary | ICD-10-CM | POA: Diagnosis not present

## 2015-10-09 DIAGNOSIS — H401111 Primary open-angle glaucoma, right eye, mild stage: Secondary | ICD-10-CM | POA: Diagnosis not present

## 2015-10-09 DIAGNOSIS — H26493 Other secondary cataract, bilateral: Secondary | ICD-10-CM | POA: Diagnosis not present

## 2015-10-09 DIAGNOSIS — Z961 Presence of intraocular lens: Secondary | ICD-10-CM | POA: Diagnosis not present

## 2015-10-17 DIAGNOSIS — H02035 Senile entropion of left lower eyelid: Secondary | ICD-10-CM | POA: Diagnosis not present

## 2015-10-28 ENCOUNTER — Other Ambulatory Visit: Payer: Self-pay

## 2015-10-28 ENCOUNTER — Other Ambulatory Visit (HOSPITAL_BASED_OUTPATIENT_CLINIC_OR_DEPARTMENT_OTHER): Payer: Medicare Other

## 2015-10-28 DIAGNOSIS — I10 Essential (primary) hypertension: Secondary | ICD-10-CM | POA: Diagnosis not present

## 2015-10-28 DIAGNOSIS — D473 Essential (hemorrhagic) thrombocythemia: Secondary | ICD-10-CM

## 2015-10-28 LAB — CBC WITH DIFFERENTIAL/PLATELET
BASO%: 0.8 % (ref 0.0–2.0)
Basophils Absolute: 0 10*3/uL (ref 0.0–0.1)
EOS ABS: 0 10*3/uL (ref 0.0–0.5)
EOS%: 0.8 % (ref 0.0–7.0)
HEMATOCRIT: 26.5 % — AB (ref 34.8–46.6)
HGB: 9 g/dL — ABNORMAL LOW (ref 11.6–15.9)
LYMPH%: 36.3 % (ref 14.0–49.7)
MCH: 46.4 pg — ABNORMAL HIGH (ref 25.1–34.0)
MCHC: 34 g/dL (ref 31.5–36.0)
MCV: 136.6 fL — ABNORMAL HIGH (ref 79.5–101.0)
MONO#: 0.2 10*3/uL (ref 0.1–0.9)
MONO%: 15.3 % — ABNORMAL HIGH (ref 0.0–14.0)
NEUT%: 46.8 % (ref 38.4–76.8)
NEUTROS ABS: 0.6 10*3/uL — AB (ref 1.5–6.5)
NRBC: 1 % — AB (ref 0–0)
PLATELETS: 163 10*3/uL (ref 145–400)
RBC: 1.94 10*6/uL — AB (ref 3.70–5.45)
RDW: 13.9 % (ref 11.2–14.5)
WBC: 1.2 10*3/uL — AB (ref 3.9–10.3)
lymph#: 0.5 10*3/uL — ABNORMAL LOW (ref 0.9–3.3)

## 2015-10-28 LAB — COMPREHENSIVE METABOLIC PANEL
ANION GAP: 6 meq/L (ref 3–11)
AST: 14 U/L (ref 5–34)
Albumin: 3.2 g/dL — ABNORMAL LOW (ref 3.5–5.0)
Alkaline Phosphatase: 45 U/L (ref 40–150)
BILIRUBIN TOTAL: 0.61 mg/dL (ref 0.20–1.20)
BUN: 17.9 mg/dL (ref 7.0–26.0)
CALCIUM: 8.4 mg/dL (ref 8.4–10.4)
CHLORIDE: 110 meq/L — AB (ref 98–109)
CO2: 27 meq/L (ref 22–29)
CREATININE: 0.9 mg/dL (ref 0.6–1.1)
EGFR: 58 mL/min/{1.73_m2} — ABNORMAL LOW (ref >90)
Glucose: 96 mg/dl (ref 70–140)
Potassium: 4 mEq/L (ref 3.5–5.1)
Sodium: 143 mEq/L (ref 136–145)
TOTAL PROTEIN: 5.5 g/dL — AB (ref 6.4–8.3)

## 2015-11-07 ENCOUNTER — Encounter: Payer: Self-pay | Admitting: *Deleted

## 2015-11-07 ENCOUNTER — Telehealth: Payer: Self-pay | Admitting: Hematology

## 2015-11-07 ENCOUNTER — Telehealth: Payer: Self-pay | Admitting: *Deleted

## 2015-11-07 DIAGNOSIS — D473 Essential (hemorrhagic) thrombocythemia: Secondary | ICD-10-CM

## 2015-11-07 NOTE — Telephone Encounter (Signed)
Pt returned call & informed to stop her hydrea due to worsening neutropenia per Dr Burr Medico & needs to have labs in 1 wk & 2 wk & see Dr Burr Medico in 2 wks.  Pt expressed understanding & POF to schedulers to call her for appts.

## 2015-11-07 NOTE — Progress Notes (Signed)
Called pt & POF sent to schedulers for appts.  Pt knows to stop hydrea.

## 2015-11-07 NOTE — Progress Notes (Signed)
Message left for pt to call back to discuss hydrea & labs.

## 2015-11-07 NOTE — Telephone Encounter (Signed)
Called patients with appointments

## 2015-11-07 NOTE — Telephone Encounter (Signed)
-----   Message from Truitt Merle, MD sent at 11/06/2015  7:25 PM EST ----- [name] [name],   Please call pt and let her know the lab test result. She has worsening neutropenia, please ask her to stop hydrea completely. Please schedule her lab in 1 and 2 weeks and f/u with me in 2 weeks.  Thanks.  Truitt Merle  11/06/2015

## 2015-11-14 ENCOUNTER — Other Ambulatory Visit (HOSPITAL_BASED_OUTPATIENT_CLINIC_OR_DEPARTMENT_OTHER): Payer: Medicare Other

## 2015-11-14 DIAGNOSIS — D473 Essential (hemorrhagic) thrombocythemia: Secondary | ICD-10-CM

## 2015-11-14 LAB — CBC WITH DIFFERENTIAL/PLATELET
BASO%: 0.4 % (ref 0.0–2.0)
Basophils Absolute: 0 10*3/uL (ref 0.0–0.1)
EOS ABS: 0 10*3/uL (ref 0.0–0.5)
EOS%: 0 % (ref 0.0–7.0)
HCT: 29.5 % — ABNORMAL LOW (ref 34.8–46.6)
HEMOGLOBIN: 9.8 g/dL — AB (ref 11.6–15.9)
LYMPH%: 21.1 % (ref 14.0–49.7)
MCH: 46.7 pg — ABNORMAL HIGH (ref 25.1–34.0)
MCHC: 33.2 g/dL (ref 31.5–36.0)
MCV: 140.5 fL — ABNORMAL HIGH (ref 79.5–101.0)
MONO#: 0.4 10*3/uL (ref 0.1–0.9)
MONO%: 19.4 % — AB (ref 0.0–14.0)
NEUT%: 59.1 % (ref 38.4–76.8)
NEUTROS ABS: 1.3 10*3/uL — AB (ref 1.5–6.5)
Platelets: 207 10*3/uL (ref 145–400)
RBC: 2.1 10*6/uL — AB (ref 3.70–5.45)
RDW: 14.7 % — AB (ref 11.2–14.5)
WBC: 2.3 10*3/uL — AB (ref 3.9–10.3)
lymph#: 0.5 10*3/uL — ABNORMAL LOW (ref 0.9–3.3)

## 2015-11-15 ENCOUNTER — Telehealth: Payer: Self-pay | Admitting: *Deleted

## 2015-11-15 ENCOUNTER — Other Ambulatory Visit: Payer: Self-pay | Admitting: Hematology

## 2015-11-15 NOTE — Telephone Encounter (Signed)
Dr. Burr Medico reviewed lab results done 11/14/15.  Called pt and left message on voice mail re:  Per Dr. Burr Medico, labs improving, but continue to HOLD Hydrea until next office visit on 11/22/15.

## 2015-11-22 ENCOUNTER — Encounter: Payer: Self-pay | Admitting: Hematology

## 2015-11-22 ENCOUNTER — Other Ambulatory Visit (HOSPITAL_BASED_OUTPATIENT_CLINIC_OR_DEPARTMENT_OTHER): Payer: Medicare Other

## 2015-11-22 ENCOUNTER — Ambulatory Visit (HOSPITAL_BASED_OUTPATIENT_CLINIC_OR_DEPARTMENT_OTHER): Payer: Medicare Other | Admitting: Hematology

## 2015-11-22 ENCOUNTER — Telehealth: Payer: Self-pay | Admitting: Hematology

## 2015-11-22 VITALS — BP 150/60 | HR 51 | Temp 98.2°F | Resp 17 | Ht 61.0 in | Wt 96.8 lb

## 2015-11-22 DIAGNOSIS — D473 Essential (hemorrhagic) thrombocythemia: Secondary | ICD-10-CM | POA: Diagnosis not present

## 2015-11-22 LAB — CBC WITH DIFFERENTIAL/PLATELET
BASO%: 0.8 % (ref 0.0–2.0)
Basophils Absolute: 0 10*3/uL (ref 0.0–0.1)
EOS%: 0.8 % (ref 0.0–7.0)
Eosinophils Absolute: 0 10*3/uL (ref 0.0–0.5)
HEMATOCRIT: 30.4 % — AB (ref 34.8–46.6)
HEMOGLOBIN: 10.4 g/dL — AB (ref 11.6–15.9)
LYMPH#: 0.7 10*3/uL — AB (ref 0.9–3.3)
LYMPH%: 19.1 % (ref 14.0–49.7)
MCH: 46.4 pg — ABNORMAL HIGH (ref 25.1–34.0)
MCHC: 34.1 g/dL (ref 31.5–36.0)
MCV: 136 fL — ABNORMAL HIGH (ref 79.5–101.0)
MONO#: 0.5 10*3/uL (ref 0.1–0.9)
MONO%: 14.1 % — ABNORMAL HIGH (ref 0.0–14.0)
NEUT%: 65.2 % (ref 38.4–76.8)
NEUTROS ABS: 2.4 10*3/uL (ref 1.5–6.5)
PLATELETS: 396 10*3/uL (ref 145–400)
RBC: 2.24 10*6/uL — AB (ref 3.70–5.45)
RDW: 14.6 % — AB (ref 11.2–14.5)
WBC: 3.7 10*3/uL — ABNORMAL LOW (ref 3.9–10.3)

## 2015-11-22 NOTE — Progress Notes (Signed)
Hematology and Oncology Follow Up Visit Date of Visit: 11/22/2015   [name] [name] [id number] Oct 23, 1923 82 y.o. [name], [name], MD  Chief Complaint: Essential Thrombocytosis.  Principle Diagnosis: Essential thrombocytosis, intitiated on Hydrea on 07/05/2011; JAK2 mutation (-), BCR-ABL(-)   Previous therapy: Initially the patient was on Hydrea 1500 mg QOD and 1000 mg QOD, with significant drop in platelet count to 60,000 and neutropenia, hydrea was held since 11/07/2015  Current therapy:  Hydrea has been held since 11/07/2015,  Aspirin 81 mg for thromboprophylaxis.  Interim History:  [name] [name] returns to the office for follow-up. Due to her significant neutropenia and thrombocytopenia, I held her Hydrea since 11/07/2015. She is doing very well, denies any episodes of fever, chill, infection, or bleeding. She is taking baby aspirin daily. No other new change or complaints since I saw her a few months ago.  Medications: I have reviewed the patient's current medications.  Current Outpatient Prescriptions  Medication Sig Dispense Refill  . amoxicillin (AMOXIL) 500 MG capsule Take 500 mg by mouth as needed (Takes prior to dental appt.).   4  . aspirin 81 MG tablet Take 81 mg by mouth daily.    Marland Kitchen atenolol (TENORMIN) 50 MG tablet Take by mouth. Take 1/2 tablet twice daily for blood pressure    . calcitonin, salmon, (MIACALCIN/FORTICAL) 200 UNIT/ACT nasal spray USE 1 SPRAY IN ALTERNATING NOSTRILS ONCE A DAY FOR BONES 3.7 mL 11  . Calcium-Vitamin D-Vitamin K (VIACTIV) [id number] MG-UNT-MCG CHEW Chew 1 each by mouth daily.    . dorzolamide-timolol (COSOPT) 22.3-6.8 MG/ML ophthalmic solution Place 1 drop into both eyes 2 (two) times daily.     . flurbiprofen (ANSAID) 100 MG tablet TAKE 1 TABLET BY MOUTH EVERY DAY 90 tablet 2  . hydroxyurea (HYDREA) 500 MG capsule TAKE 1 CAPSULES BY MOUTH DAILY ON Monday, Wednesday AND Friday, AND 2 TAB DAILY ON THE REST OF WEEK . (Patient taking differently: 11/07/15   Hold for now.TAKE 1 CAPSULES BY MOUTH DAILY ON Monday, Wednesday AND Friday, AND 2 TAB DAILY ON THE REST OF WEEK .) 50 capsule 1  . latanoprost (XALATAN) 0.005 % ophthalmic solution Place 1 drop into both eyes daily.    . Magnesium 250 MG TABS Take by mouth. Take one tablet daily to help with constipation    . URELLE (URELLE/URISED) 81 MG TABS Take 1 tablet by mouth daily. Take one tablet once a day for bladder    . vitamin E 400 UNIT capsule Take 400 Units by mouth every other day.     No current facility-administered medications for this visit.   Allergies:  Allergies  Allergen Reactions  . Darvocet [Propoxyphene N-Acetaminophen] Nausea And Vomiting  . Darvon Nausea And Vomiting  . Meperidine And Related Nausea And Vomiting  . Vicodin [Hydrocodone-Acetaminophen] Nausea And Vomiting   Past Medical History, Surgical history, Social history, and Family History were reviewed and updated.  Review of Systems: Constitutional:  Negative for fever, chills, night sweats, anorexia, weight loss, pain. Cardiovascular: no chest pain or dyspnea on exertion Respiratory: no cough, shortness of breath, or wheezing Neurological: no TIA or stroke symptoms Dermatological: negative for rash ENT: negative for - epistaxis or headaches Skin: Negative. Gastrointestinal: no abdominal pain, change in bowel habits, or black or bloody stools Genito-Urinary: no dysuria, trouble voiding, or hematuria Hematological and Lymphatic: negative for - bleeding problems or blood clots Breast: negative for breast lumps Musculoskeletal: negative for - gait disturbance Remaining ROS negative.  Physical Exam: Blood pressure 150/60,  pulse 51, temperature 98.2 F (36.8 C), temperature source Oral, resp. rate 17, height 5\' 1"  (1.549 m), weight 96 lb 12.8 oz (43.908 kg), SpO2 97 %. ECOG: 0 General appearance: alert, cooperative, appears stated age and no distress Head: Normocephalic, without obvious abnormality,  atraumatic Neck: no adenopathy, supple, symmetrical, trachea midline and thyroid not enlarged, symmetric, no tenderness/mass/nodules HEENT: OP clear; PERRL; EOMi.  Lymph nodes: Cervical, supraclavicular, and axillary nodes normal. Heart:regular rate and rhythm, S1, S2 normal, no murmur, click, rub or gallop Lung:chest clear, no wheezing, rales, normal symmetric air entry Abdomin: soft, non-tender, without masses or organomegaly EXT:No peripheral edema   Lab Results: CBC Latest Ref Rng 11/22/2015 11/14/2015 10/28/2015  WBC 3.9 - 10.3 10e3/uL 3.7(L) 2.3(L) 1.2(L)  Hemoglobin 11.6 - 15.9 g/dL 10.4(L) 9.8(L) 9.0(L)  Hematocrit 34.8 - 46.6 % 30.4(L) 29.5(L) 26.5(L)  Platelets 145 - 400 10e3/uL 396 207 163    CMP Latest Ref Rng 10/28/2015 09/30/2015 08/15/2015  Glucose 70 - 140 mg/dl 96 95 -  BUN 7.0 - 26.0 mg/dL 17.9 21.0 21  Creatinine 0.6 - 1.1 mg/dL 0.9 0.8 0.9  Sodium 136 - 145 mEq/L 143 141 140  Potassium 3.5 - 5.1 mEq/L 4.0 4.1 4.6  Chloride 97 - 108 mmol/L - - -  CO2 22 - 29 mEq/L 27 26 -  Calcium 8.4 - 10.4 mg/dL 8.4 8.8 -  Total Protein 6.4 - 8.3 g/dL 5.5(L) 5.8(L) -  Total Bilirubin 0.20 - 1.20 mg/dL 0.61 0.71 -  Alkaline Phos 40 - 150 U/L 45 45 41  AST 5 - 34 U/L 14 16 14   ALT 0 - 55 U/L <9 <9 6(A)   ANC 2.4 today     Impression and Plan:  1. Essential thrombocythemia -- [name] [name] is an 82 year old woman with essential thrombocytosis, has been on Hydrea since 12/2011. Due to the significant cytopenia, Hydrea has been held since 11/07/2015.  -We reviewed the natural history of on essential thrombocythemia and the main complications which is venous and arterial thrombosis -Her cytopenia has improved since Hydrea was held 2 weeks ago, platelet count 3 96,000 today.  -We'll continue monitor her blood counts for now. If her platelet count increases to above 450K, I'll recommend anagrelide to control her thrombocytosis. The benefit and the potential side effects were discussed  with her, she agrees. -She'll continue aspirin 81 mg daily  2. She will continue follow up with her PCP for other medical issues.    Plan:  -Continue to hold Hydrea -CBC monthly, I'll see her back in 2 months  -If her platelet counts increase to >450K, I will call in anagrelide 0.5 mg daily.  Truitt Merle  11/22/2015

## 2015-11-22 NOTE — Telephone Encounter (Signed)
Pt confirmed appt and received avs °

## 2015-11-25 ENCOUNTER — Ambulatory Visit: Payer: Medicare Other | Admitting: Hematology

## 2015-11-25 ENCOUNTER — Other Ambulatory Visit: Payer: Medicare Other

## 2015-12-12 ENCOUNTER — Emergency Department (HOSPITAL_COMMUNITY)
Admission: EM | Admit: 2015-12-12 | Discharge: 2015-12-12 | Disposition: A | Discharge status: Home / Self Care | Payer: Medicare Other | Attending: Emergency Medicine | Admitting: Emergency Medicine

## 2015-12-12 ENCOUNTER — Encounter (HOSPITAL_COMMUNITY): Payer: Self-pay | Admitting: Emergency Medicine

## 2015-12-12 DIAGNOSIS — Z79899 Other long term (current) drug therapy: Secondary | ICD-10-CM | POA: Insufficient documentation

## 2015-12-12 DIAGNOSIS — Z7982 Long term (current) use of aspirin: Secondary | ICD-10-CM | POA: Insufficient documentation

## 2015-12-12 DIAGNOSIS — R011 Cardiac murmur, unspecified: Secondary | ICD-10-CM | POA: Insufficient documentation

## 2015-12-12 DIAGNOSIS — Z8781 Personal history of (healed) traumatic fracture: Secondary | ICD-10-CM | POA: Diagnosis not present

## 2015-12-12 DIAGNOSIS — Z8619 Personal history of other infectious and parasitic diseases: Secondary | ICD-10-CM | POA: Diagnosis not present

## 2015-12-12 DIAGNOSIS — R509 Fever, unspecified: Secondary | ICD-10-CM | POA: Diagnosis present

## 2015-12-12 DIAGNOSIS — N390 Urinary tract infection, site not specified: Secondary | ICD-10-CM

## 2015-12-12 DIAGNOSIS — M5430 Sciatica, unspecified side: Secondary | ICD-10-CM | POA: Diagnosis not present

## 2015-12-12 DIAGNOSIS — R5383 Other fatigue: Secondary | ICD-10-CM | POA: Diagnosis not present

## 2015-12-12 DIAGNOSIS — M1990 Unspecified osteoarthritis, unspecified site: Secondary | ICD-10-CM | POA: Insufficient documentation

## 2015-12-12 DIAGNOSIS — Z8719 Personal history of other diseases of the digestive system: Secondary | ICD-10-CM | POA: Diagnosis not present

## 2015-12-12 DIAGNOSIS — Z8639 Personal history of other endocrine, nutritional and metabolic disease: Secondary | ICD-10-CM | POA: Insufficient documentation

## 2015-12-12 DIAGNOSIS — I10 Essential (primary) hypertension: Secondary | ICD-10-CM | POA: Diagnosis not present

## 2015-12-12 DIAGNOSIS — Z862 Personal history of diseases of the blood and blood-forming organs and certain disorders involving the immune mechanism: Secondary | ICD-10-CM | POA: Insufficient documentation

## 2015-12-12 DIAGNOSIS — H409 Unspecified glaucoma: Secondary | ICD-10-CM | POA: Diagnosis not present

## 2015-12-12 LAB — COMPREHENSIVE METABOLIC PANEL
ALT: 11 U/L — ABNORMAL LOW (ref 14–54)
ANION GAP: 12 (ref 5–15)
AST: 19 U/L (ref 15–41)
Albumin: 3.4 g/dL — ABNORMAL LOW (ref 3.5–5.0)
Alkaline Phosphatase: 55 U/L (ref 38–126)
BUN: 26 mg/dL — ABNORMAL HIGH (ref 6–20)
CALCIUM: 8.8 mg/dL — AB (ref 8.9–10.3)
CHLORIDE: 108 mmol/L (ref 101–111)
CO2: 23 mmol/L (ref 22–32)
Creatinine, Ser: 1.1 mg/dL — ABNORMAL HIGH (ref 0.44–1.00)
GFR calc non Af Amer: 43 mL/min — ABNORMAL LOW (ref >60)
GFR, EST AFRICAN AMERICAN: 50 mL/min — AB (ref >60)
Glucose, Bld: 158 mg/dL — ABNORMAL HIGH (ref 65–99)
Potassium: 3.6 mmol/L (ref 3.5–5.1)
SODIUM: 143 mmol/L (ref 135–145)
Total Bilirubin: 0.9 mg/dL (ref 0.3–1.2)
Total Protein: 6.4 g/dL — ABNORMAL LOW (ref 6.5–8.1)

## 2015-12-12 LAB — CBC WITH DIFFERENTIAL/PLATELET
BASOS PCT: 0 %
Basophils Absolute: 0 10*3/uL (ref 0.0–0.1)
EOS ABS: 0 10*3/uL (ref 0.0–0.7)
EOS PCT: 0 %
HEMATOCRIT: 32.8 % — AB (ref 36.0–46.0)
Hemoglobin: 10.6 g/dL — ABNORMAL LOW (ref 12.0–15.0)
LYMPHS PCT: 7 %
Lymphs Abs: 0.5 10*3/uL — ABNORMAL LOW (ref 0.7–4.0)
MCH: 39.3 pg — ABNORMAL HIGH (ref 26.0–34.0)
MCHC: 32.3 g/dL (ref 30.0–36.0)
MCV: 121.5 fL — ABNORMAL HIGH (ref 78.0–100.0)
Monocytes Absolute: 0.9 10*3/uL (ref 0.1–1.0)
Monocytes Relative: 11 %
NEUTROS PCT: 82 %
Neutro Abs: 6.4 10*3/uL (ref 1.7–7.7)
PLATELETS: 538 10*3/uL — AB (ref 150–400)
RBC: 2.7 MIL/uL — AB (ref 3.87–5.11)
RDW: 17.3 % — AB (ref 11.5–15.5)
WBC: 7.8 10*3/uL (ref 4.0–10.5)

## 2015-12-12 LAB — URINALYSIS, ROUTINE W REFLEX MICROSCOPIC
BILIRUBIN URINE: NEGATIVE
Glucose, UA: NEGATIVE mg/dL
KETONES UR: NEGATIVE mg/dL
NITRITE: NEGATIVE
Protein, ur: 100 mg/dL — AB
Specific Gravity, Urine: 1.02 (ref 1.005–1.030)
pH: 5.5 (ref 5.0–8.0)

## 2015-12-12 LAB — URINE MICROSCOPIC-ADD ON

## 2015-12-12 LAB — LIPASE, BLOOD: LIPASE: 23 U/L (ref 11–51)

## 2015-12-12 MED ORDER — NITROFURANTOIN MONOHYD MACRO 100 MG PO CAPS: 100.0000 mg | ORAL_CAPSULE | Freq: Two times a day (BID) | ORAL | Status: DC

## 2015-12-12 MED ORDER — NITROFURANTOIN MONOHYD MACRO 100 MG PO CAPS
100.0000 mg | ORAL_CAPSULE | Freq: Once | ORAL | Status: AC
  Administered 2015-12-12: 100 mg via ORAL
  Filled 2015-12-12: qty 1

## 2015-12-12 NOTE — ED Notes (Signed)
Reminded patient to use call bell for assistance.  Patient currently comfortable. Breathing even and unlabored and denies pain.

## 2015-12-12 NOTE — Discharge Instructions (Signed)
As discussed, your evaluation today has been largely reassuring.  But, it is important that you monitor your condition carefully, and do not hesitate to return to the ED if you develop new, or concerning changes in your condition. ? ?Otherwise, please follow-up with your physician for appropriate ongoing care. ? ?

## 2015-12-12 NOTE — ED Notes (Signed)
Pt from [facility]. Today pt began to feel "yucky." staff took temperature of 104.66F at facility. EMS got a temp of 103 and gave 1000mg  tylenol 30 minutes ago. Pt denies any other symptoms such as n/v/d, cough, sore throat or dysuria.

## 2015-12-12 NOTE — ED Provider Notes (Signed)
CSN: [id number]     Arrival date & time 12/12/15  1642 History   First MD Initiated Contact with Patient 12/12/15 1648     Chief Complaint  Patient presents with  . Fever  . Fatigue     (Consider location/radiation/quality/duration/timing/severity/associated sxs/prior Treatment) HPI Patient presents with concern of fever. Patient lives in a assisted living facility, was told earlier today, that she had temperature of 104. She states that she was feeling generally unwell. However, patient was provided 1 g of Tylenol. Currently she denies any complaints, including no nausea, vomiting, fever, cough, sore throat.  Past Medical History  Diagnosis Date  . Glaucoma   . Hypertension   . Arthritis   . Neck fracture ([place])   . Abdominal pain, unspecified site 08/11/2012  . Essential thrombocythemia ([place]) 06/18/2011  . Anemia, unspecified 06/18/2011  . 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 Loss of weight 06/18/2011  . Lumbago 12/18/2010  . Sciatica 06/19/2010  . Memory loss 12/19/2009  . Diverticulosis of colon (without mention of hemorrhage) 04/20/2007  . Disorder of bone and cartilage, unspecified 04/20/2007  . Hemorrhage of gastrointestinal tract, unspecified 03/09/2007  . Other and unspecified hyperlipidemia 09/01/2006  . BBB (bundle branch block) 03/03/2006  . Cardiac dysrhythmia, unspecified 03/03/2006  . Chronic interstitial cystitis 03/03/2006  . Osteoarthrosis, unspecified whether generalized or localized, unspecified site 03/03/2006  . Hematuria, unspecified 04/22/2004  . Herpes zoster without mention of complication [id number]  . 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 Appendectomy    . Tonsillectomy    . Abdominal hysterectomy    . Cataract extraction w/ intraocular lens  implant, bilateral    . Breast cyst excision Bilateral     benign   Family History  Problem Relation Age of Onset  . Heart disease Father     MI   Social History   Substance Use Topics  . Smoking status: Never Smoker   . Smokeless tobacco: Never Used  . Alcohol Use: No   OB History    No data available     Review of Systems  Constitutional:       Per HPI, otherwise negative  HENT:       Per HPI, otherwise negative  Respiratory:       Per HPI, otherwise negative  Cardiovascular:       Per HPI, otherwise negative  Gastrointestinal: Negative for vomiting.  Endocrine:       Negative aside from HPI  Genitourinary:       Neg aside from HPI   Musculoskeletal:       Per HPI, otherwise negative  Skin: Negative.   Neurological: Negative for syncope.      Allergies  Darvocet; Darvon; Meperidine and related; and Vicodin  Home Medications   Prior to Admission medications   Medication Sig Start Date End Date Taking? Authorizing Provider  atenolol (TENORMIN) 50 MG tablet Take 25 mg by mouth 2 (two) times daily.  09/25/11  Yes Historical Provider, MD  Calcium-Vitamin D-Vitamin K (VIACTIV) [id number] MG-UNT-MCG CHEW Chew 1 each by mouth daily.   Yes Historical Provider, MD  dorzolamide-timolol (COSOPT) 22.3-6.8 MG/ML ophthalmic solution Place 1 drop into both eyes 2 (two) times daily.  03/09/14  Yes Historical Provider, MD  latanoprost (XALATAN) 0.005 % ophthalmic solution Place 1 drop into both eyes daily.   Yes Historical Provider, MD  Magnesium 250 MG TABS Take 1 tablet by mouth daily.    Yes Historical  Provider, MD  URELLE (URELLE/URISED) 81 MG TABS Take 1 tablet by mouth daily.  10/03/11  Yes Historical Provider, MD  vitamin E 400 UNIT capsule Take 400 Units by mouth every other day.   Yes Historical Provider, MD  amoxicillin (AMOXIL) 500 MG capsule Take 500 mg by mouth as needed (Takes prior to dental appt.). Reported on 11/22/2015 12/11/14   Historical Provider, MD  aspirin 81 MG tablet Take 81 mg by mouth daily.    Historical Provider, MD  calcitonin, salmon, (MIACALCIN/FORTICAL) 200 UNIT/ACT nasal spray USE 1 SPRAY IN ALTERNATING NOSTRILS  ONCE A DAY FOR BONES 09/09/15   Estill Dooms, MD  flurbiprofen (ANSAID) 100 MG tablet TAKE 1 TABLET BY MOUTH EVERY DAY Patient not taking: Reported on 12/12/2015 09/13/15   Estill Dooms, MD  hydroxyurea (HYDREA) 500 MG capsule TAKE 1 CAPSULES BY MOUTH DAILY ON Monday, Wednesday AND Friday, AND 2 TAB DAILY ON THE REST OF WEEK . Patient not taking: Reported on 11/22/2015 09/30/15   Truitt Merle, MD   BP 136/67 mmHg  Pulse 62  Temp(Src) 100.6 F (38.1 C) (Oral)  Resp 18  SpO2 98% Physical Exam  Constitutional: She is oriented to person, place, and time. She appears well-developed and well-nourished. No distress.  HENT:  Head: Normocephalic and atraumatic.  Eyes: Conjunctivae and EOM are normal.  Cardiovascular: Normal rate and regular rhythm.   Murmur heard. Pulmonary/Chest: Effort normal and breath sounds normal. No stridor. No respiratory distress.  Abdominal: She exhibits no distension. There is no tenderness.  Musculoskeletal: She exhibits no edema.  Neurological: She is alert and oriented to person, place, and time. No cranial nerve deficit. She exhibits normal muscle tone. Coordination normal.  Skin: Skin is warm and dry.  Psychiatric: She has a normal mood and affect.  Nursing note and vitals reviewed.   ED Course  Procedures (including critical care time) Labs Review Labs Reviewed  COMPREHENSIVE METABOLIC PANEL - Abnormal; Notable for the following:    Glucose, Bld 158 (*)    BUN 26 (*)    Creatinine, Ser 1.10 (*)    Calcium 8.8 (*)    Total Protein 6.4 (*)    Albumin 3.4 (*)    ALT 11 (*)    GFR calc non Af Amer 43 (*)    GFR calc Af Amer 50 (*)    All other components within normal limits  CBC WITH DIFFERENTIAL/PLATELET - Abnormal; Notable for the following:    RBC 2.70 (*)    Hemoglobin 10.6 (*)    HCT 32.8 (*)    MCV 121.5 (*)    MCH 39.3 (*)    RDW 17.3 (*)    Platelets 538 (*)    Lymphs Abs 0.5 (*)    All other components within normal limits  URINALYSIS,  ROUTINE W REFLEX MICROSCOPIC (NOT AT [facility]) - Abnormal; Notable for the following:    Color, Urine GREEN (*)    APPearance CLOUDY (*)    Hgb urine dipstick LARGE (*)    Protein, ur 100 (*)    Leukocytes, UA MODERATE (*)    All other components within normal limits  URINE MICROSCOPIC-ADD ON - Abnormal; Notable for the following:    Squamous Epithelial / LPF 0-5 (*)    Bacteria, UA MANY (*)    Casts GRANULAR CAST (*)    All other components within normal limits  URINE CULTURE  LIPASE, BLOOD     7:44 PM On repeat exam the patient is  in no distress. We discussed all findings.  Patient will follow-up with primary care.  MDM  Largely symptomatic patient presents with concern of fever earlier today. Here, the patient is awake, alert, with soft, non-peritoneal abdomen. Patient has no evidence for bacteremia or sepsis. Patient does have low-grade fever, and lab findings consistent with urinary tract infection. Patient discharged in stable condition.  Urine culture sent.   Carmin Muskrat, MD 12/12/15 1945

## 2015-12-12 NOTE — ED Notes (Signed)
[facility] called for transport.  

## 2015-12-12 NOTE — ED Notes (Signed)
Bed: WA09 Expected date:  Expected time:  Means of arrival:  Comments: EMS- 82 yo fever

## 2015-12-12 NOTE — ED Notes (Signed)
[facility] called.  Gave report to RN.  Advised of medications and f/u.

## 2015-12-12 NOTE — Progress Notes (Signed)
CSW met with patient at bedside. There was no family present. Patient confirms that she is from [facility]. Per note, patient presents to [facility] due to high temperature.  Patient states she completes her ADL's independently. Patient informed CSW that she lives in the the independent living unit of the facility. Patient states she has been at the facility for "close to 10 years".  Patient states she has a good support sytem, which consist of her friend Ray.  Patient states she feels safe to return back to facility.   Patient states she has no questions for CSW at this time. CSW provided patient with warm blanket.  Willette Brace [phone] ED CSW 12/12/2015 7:36 PM

## 2015-12-12 NOTE — ED Notes (Signed)
Patient d/c'd via [facility] to [facility].  F/U and medications discussed.  Report called to facility.  Patient verbalized understanding.

## 2015-12-15 LAB — URINE CULTURE: Culture: >=100000

## 2015-12-16 ENCOUNTER — Telehealth (HOSPITAL_BASED_OUTPATIENT_CLINIC_OR_DEPARTMENT_OTHER): Payer: Self-pay | Admitting: Emergency Medicine

## 2015-12-16 NOTE — Telephone Encounter (Signed)
Post ED Visit - Positive Culture Follow-up  Culture report reviewed by antimicrobial stewardship pharmacist:  [x]  Elenor Quinones, Pharm.D. []  Heide Guile, Pharm.D., BCPS []  Parks Neptune, Pharm.D. []  Alycia Rossetti, Pharm.D., BCPS []  [place], Pharm.D., BCPS, AAHIVP []  Legrand Como, Pharm.D., BCPS, AAHIVP []  Milus Glazier, Pharm.D. []  Rob Evette Doffing, Pharm.D.  Positive urine culture E. coli Treated with nitrofurantoin, organism sensitive to the same and no further patient follow-up is required at this time.  Hazle Nordmann 12/16/2015, 9:01 AM

## 2015-12-17 ENCOUNTER — Telehealth: Payer: Self-pay | Admitting: *Deleted

## 2015-12-17 NOTE — Telephone Encounter (Signed)
Received call from son, [name] [name] stating that mother is seeing Dr Burr Medico & is on hydrea & he has noticed some memory problems, ? Dementia & he has done some research on hydrea & read that it can rarely cause dementia & wonders if this can be the problem.  He reports that she is 82 years old & knows that could be part of the problem also.  Reviewed chart & per Dr Mart Piggs is on hold. Son will discuss with [facility].  Noted that plt count in ED was 538 & also noted that pt had UTI. Will discuss with Dr Burr Medico.  [place] & pt is in assisted living & Tiffany-nurse reports that pt has been on hydrea since being in assisted living 12/13/15 & reported that she had been on this med.  She has been taking 1000 mg Monday-Friday & 500 mg on sat & Sunday.  Informed son, [name] [name] that we will see pt 12/23/15 & sort this out & get back with him after that visit.  He appreciated the call.  He can be reached at [phone] cell & home [phone].  He is in [place].

## 2015-12-18 NOTE — Telephone Encounter (Signed)
"  [facility] needs an order to hold Hydrea if this is what the plan is.  Fax order to [phone].

## 2015-12-19 ENCOUNTER — Non-Acute Institutional Stay: Payer: Medicare Other | Admitting: Nurse Practitioner

## 2015-12-19 ENCOUNTER — Encounter: Payer: Self-pay | Admitting: Nurse Practitioner

## 2015-12-19 VITALS — BP 139/86 | HR 86 | Temp 97.9°F | Ht 61.0 in | Wt 96.0 lb

## 2015-12-19 DIAGNOSIS — M810 Age-related osteoporosis without current pathological fracture: Secondary | ICD-10-CM | POA: Diagnosis not present

## 2015-12-19 DIAGNOSIS — D473 Essential (hemorrhagic) thrombocythemia: Secondary | ICD-10-CM

## 2015-12-19 DIAGNOSIS — R413 Other amnesia: Secondary | ICD-10-CM

## 2015-12-19 DIAGNOSIS — N390 Urinary tract infection, site not specified: Secondary | ICD-10-CM | POA: Diagnosis not present

## 2015-12-19 DIAGNOSIS — I10 Essential (primary) hypertension: Secondary | ICD-10-CM

## 2015-12-19 NOTE — Telephone Encounter (Signed)
Will continue her hydrea and see her back on 2/13.  Truitt Merle  12/19/2015

## 2015-12-19 NOTE — Assessment & Plan Note (Signed)
Plt 500s while in ED 12/12/15, takes Hydroxyurea 500mg  II M-F, I sat and sun. F/u oncology.

## 2015-12-19 NOTE — Assessment & Plan Note (Signed)
Taking Miacalcin 200u/act one spray in alternating nostrils daily.

## 2015-12-19 NOTE — Telephone Encounter (Signed)
CalledTiffany with [facility] [phone] ext [phone] with orders from Dr. Burr Medico.  Voicemail received.  Information per Dr. Burr Medico left on voicemail.

## 2015-12-19 NOTE — Assessment & Plan Note (Signed)
12/16/15 MMSE 24/30, 25/30, continue AL for care needs.

## 2015-12-19 NOTE — Progress Notes (Signed)
This encounter was created in error - please disregard.

## 2015-12-19 NOTE — Assessment & Plan Note (Signed)
12/12/15 ED eval for T104, UTI was identified, Nitrofurantoin to be completed. She is asymptomatic today.

## 2015-12-19 NOTE — Progress Notes (Signed)
Patient ID: [name] [name], female   DOB: 19-Oct-1923, 82 y.o.   MRN: [id number]  Location:  clinic [place] Provider:  Marlana Latus NP  Code Status:  DNR Goals of care: Advanced Directive information    Chief Complaint  Patient presents with  . Hospitalization Follow-up     HPI: Patient is a 82 y.o. female seen in the clinic at [facility] today for evaluation of  ED eval 12/12/15 for T 104, she was identified for UTI, Nitrofurantoin to be completed 12/20/15 at [facility]. Hx of HTN, essential hypercythemia, memory,   Review of Systems:  Review of Systems  Constitutional: Negative for fever and diaphoresis.  Eyes: Negative.   Respiratory: Negative.   Cardiovascular: Negative.   Gastrointestinal: Negative.   Genitourinary: Negative.   Musculoskeletal: Negative.   Neurological:       Memory loss  Endo/Heme/Allergies:       Thrombocythemia  Psychiatric/Behavioral: Negative.     Past Medical History  Diagnosis Date  . Glaucoma   . Hypertension   . Arthritis   . Neck fracture ([place])   . Abdominal pain, unspecified site 08/11/2012  . Essential thrombocythemia ([place]) 06/18/2011  . Anemia, unspecified 06/18/2011  . 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 Loss of weight 06/18/2011  . Lumbago 12/18/2010  . Sciatica 06/19/2010  . Memory loss 12/19/2009  . Diverticulosis of colon (without mention of hemorrhage) 04/20/2007  . Disorder of bone and cartilage, unspecified 04/20/2007  . Hemorrhage of gastrointestinal tract, unspecified 03/09/2007  . Other and unspecified hyperlipidemia 09/01/2006  . BBB (bundle branch block) 03/03/2006  . Cardiac dysrhythmia, unspecified 03/03/2006  . Chronic interstitial cystitis 03/03/2006  . Osteoarthrosis, unspecified whether generalized or localized, unspecified site 03/03/2006  . Hematuria, unspecified 04/22/2004  . Herpes zoster without mention of complication [id number]  . 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 UTI (urinary tract infection) 12/19/2015  . Osteoporosis 12/19/2015  . Essential thrombocythemia ([place]) 09/28/2014  . Hoarse 05/07/2013  . Hypertension   . Glaucoma 01/06/2012  . Anemia 06/18/2011  . Loss of weight 06/18/2011  . Memory loss 12/19/2009  . Hyperlipidemia 09/01/2006    Allergies  Allergen Reactions  . Darvocet [Propoxyphene N-Acetaminophen] Nausea And Vomiting  . Darvon Nausea And Vomiting  . Meperidine And Related Nausea And Vomiting  . Vicodin [Hydrocodone-Acetaminophen] Nausea And Vomiting  . Demerol [Meperidine]     Medications: Patient's Medications  New Prescriptions   No medications on file  Previous Medications   ATENOLOL (TENORMIN) 50 MG TABLET    Take 25 mg by mouth 2 (two) times daily.    CALCITONIN, SALMON, (MIACALCIN/FORTICAL) 200 UNIT/ACT NASAL SPRAY    USE 1 SPRAY IN ALTERNATING NOSTRILS ONCE A DAY FOR BONES   DORZOLAMIDE-TIMOLOL (COSOPT) 22.3-6.8 MG/ML OPHTHALMIC SOLUTION    Place 1 drop into both eyes 2 (two) times daily.    FLURBIPROFEN (ANSAID) 100 MG TABLET    TAKE 1 TABLET BY MOUTH EVERY DAY   HYDROXYUREA (HYDREA) 500 MG CAPSULE    TAKE 1 CAPSULES BY MOUTH DAILY ON Monday, Wednesday AND Friday, AND 2 TAB DAILY ON THE REST OF WEEK .   LATANOPROST (XALATAN) 0.005 % OPHTHALMIC SOLUTION    Place 1 drop into both eyes daily.   MAGNESIUM 250 MG TABS    Take 1 tablet by mouth daily.    NITROFURANTOIN (MACRODANTIN) 100 MG CAPSULE    Take one tablet by mouth twice daily for  UTI   SACCHAROMYCES BOULARDII (FLORASTOR) 250 MG CAPSULE    Take one capsule by mouth twice daily   URELLE (URELLE/URISED) 81 MG TABS    Take 1 tablet by mouth daily.    VITAMIN E 400 UNIT CAPSULE    Take 400 Units by mouth every other day.  Modified Medications   No medications on file  Discontinued Medications   ACETAMINOPHEN (TYLENOL) 325 MG TABLET    Take 650 mg by mouth every 4 (four) hours as needed.    Physical Exam: Filed Vitals:   12/19/15 1456  BP: 139/86    Pulse: 86  Temp: 97.9 F (36.6 C)  TempSrc: Oral  Height: 5\' 1"  (1.549 m)  Weight: 96 lb (43.545 kg)  SpO2: 96%   Body mass index is 18.15 kg/(m^2).  Physical Exam  Constitutional: She is oriented to person, place, and time. She appears well-developed and well-nourished. No distress.  HENT:  Head: Normocephalic and atraumatic.  Right Ear: External ear normal.  Left Ear: External ear normal.  Nose: Nose normal.  Mouth/Throat: Oropharynx is clear and moist.  Eyes: Conjunctivae and EOM are normal. Pupils are equal, round, and reactive to light.  Corrective lenses  Neck: Neck supple. No JVD present. No tracheal deviation present. No thyromegaly present.  Cardiovascular: Normal rate and regular rhythm.  Exam reveals no gallop and no friction rub.   Murmur (2/6/LSB SEM) heard. Pulmonary/Chest: No respiratory distress. She has no wheezes. She has no rales.  Abdominal: She exhibits no distension and no mass. There is no tenderness.  Musculoskeletal: She exhibits no edema or tenderness.  Heberden's nodes. Bouchard's nodes. Contracture of the right hand.  Lymphadenopathy:    She has no cervical adenopathy.  Neurological: She is alert and oriented to person, place, and time. She has normal reflexes. No cranial nerve deficit. Coordination normal.  08/22/2015 MMSE 28/30. Failed clock drawing.  Skin: No rash noted. No erythema. No pallor.  Psychiatric: She has a normal mood and affect. Her behavior is normal. Judgment and thought content normal.    Labs reviewed: Basic Metabolic Panel:  Recent Labs  09/30/15 0908 10/28/15 1035 12/12/15 1732  NA 141 143 143  K 4.1 4.0 3.6  CL  --   --  108  CO2 26 27 23   GLUCOSE 95 96 158*  BUN 21.0 17.9 26*  CREATININE 0.8 0.9 1.10*  CALCIUM 8.8 8.4 8.8*    Liver Function Tests:  Recent Labs  09/30/15 0908 10/28/15 1035 12/12/15 1732  AST 16 14 19   ALT <9 <9 11*  ALKPHOS 45 45 55  BILITOT 0.71 0.61 0.9  PROT 5.8* 5.5* 6.4*   ALBUMIN 3.4* 3.2* 3.4*    CBC:  Recent Labs  11/14/15 1017 11/22/15 0850 12/12/15 1732  WBC 2.3* 3.7* 7.8  NEUTROABS 1.3* 2.4 6.4  HGB 9.8* 10.4* 10.6*  HCT 29.5* 30.4* 32.8*  MCV 140.5* 136.0* 121.5*  PLT 207 396 538*    No results found for: TSH No results found for: HGBA1C Lab Results  Component Value Date   CHOL 178 02/12/2015   HDL 77* 02/12/2015   LDLCALC 83 02/12/2015   TRIG 92 02/12/2015    Significant Diagnostic Results since last visit: none  Patient Care Team: Estill Dooms, MD as PCP - General (Internal Medicine) [place] Averyanna Sax X, NP as Nurse Practitioner (Nurse Practitioner)  Assessment/Plan Problem List Items Addressed This Visit    Hypertension    Controlled, continue Atenolol 25mg  bid.  Memory loss    12/16/15 MMSE 24/30, 25/30, continue AL for care needs.       Essential thrombocythemia ([place])    Plt 500s while in ED 12/12/15, takes Hydroxyurea 500mg  II M-F, I sat and sun. F/u oncology.       UTI (urinary tract infection) - Primary    12/12/15 ED eval for T104, UTI was identified, Nitrofurantoin to be completed. She is asymptomatic today.      Relevant Medications   nitrofurantoin (MACRODANTIN) 100 MG capsule   Osteoporosis    Taking Miacalcin 200u/act one spray in alternating nostrils daily.           Family/ staff Communication: PT to eval and tx, goal is to return IL.   Labs/tests ordered:  none  ManXie Jun Osment NP [facility] [address], [place] [address] On Call:  [phone] & follow prompts after 5pm & weekends Office Phone:  [phone] Office Fax:  [phone]

## 2015-12-19 NOTE — Assessment & Plan Note (Signed)
Controlled, continue Atenolol 25mg bid.  

## 2015-12-23 ENCOUNTER — Telehealth: Payer: Self-pay | Admitting: *Deleted

## 2015-12-23 ENCOUNTER — Other Ambulatory Visit (HOSPITAL_BASED_OUTPATIENT_CLINIC_OR_DEPARTMENT_OTHER): Payer: Medicare Other

## 2015-12-23 DIAGNOSIS — D473 Essential (hemorrhagic) thrombocythemia: Secondary | ICD-10-CM | POA: Diagnosis present

## 2015-12-23 LAB — CBC & DIFF AND RETIC
BASO%: 0.4 % (ref 0.0–2.0)
Basophils Absolute: 0 10*3/uL (ref 0.0–0.1)
EOS%: 1.2 % (ref 0.0–7.0)
Eosinophils Absolute: 0.1 10*3/uL (ref 0.0–0.5)
HEMATOCRIT: 34.7 % — AB (ref 34.8–46.6)
HGB: 11 g/dL — ABNORMAL LOW (ref 11.6–15.9)
Immature Retic Fract: 14 % — ABNORMAL HIGH (ref 1.60–10.00)
LYMPH%: 14.9 % (ref 14.0–49.7)
MCH: 37.3 pg — AB (ref 25.1–34.0)
MCHC: 31.7 g/dL (ref 31.5–36.0)
MCV: 117.6 fL — AB (ref 79.5–101.0)
MONO#: 0.3 10*3/uL (ref 0.1–0.9)
MONO%: 5.9 % (ref 0.0–14.0)
NEUT#: 3.9 10*3/uL (ref 1.5–6.5)
NEUT%: 77.6 % — AB (ref 38.4–76.8)
PLATELETS: 832 10*3/uL — AB (ref 145–400)
RBC: 2.95 10*6/uL — ABNORMAL LOW (ref 3.70–5.45)
RDW: 19.1 % — ABNORMAL HIGH (ref 11.2–14.5)
Retic %: 2.42 % — ABNORMAL HIGH (ref 0.70–2.10)
Retic Ct Abs: 71.39 10*3/uL (ref 33.70–90.70)
WBC: 5.1 10*3/uL (ref 3.9–10.3)
lymph#: 0.8 10*3/uL — ABNORMAL LOW (ref 0.9–3.3)
nRBC: 0 % (ref 0–0)

## 2015-12-23 LAB — COMPREHENSIVE METABOLIC PANEL
ALT: 10 U/L (ref 0–55)
AST: 15 U/L (ref 5–34)
Albumin: 3.1 g/dL — ABNORMAL LOW (ref 3.5–5.0)
Alkaline Phosphatase: 66 U/L (ref 40–150)
Anion Gap: 6 mEq/L (ref 3–11)
BUN: 19.9 mg/dL (ref 7.0–26.0)
CALCIUM: 8.9 mg/dL (ref 8.4–10.4)
CHLORIDE: 107 meq/L (ref 98–109)
CO2: 27 meq/L (ref 22–29)
CREATININE: 0.9 mg/dL (ref 0.6–1.1)
EGFR: 58 mL/min/{1.73_m2} — ABNORMAL LOW (ref >90)
GLUCOSE: 91 mg/dL (ref 70–140)
POTASSIUM: 4.2 meq/L (ref 3.5–5.1)
Sodium: 140 mEq/L (ref 136–145)
Total Bilirubin: 0.62 mg/dL (ref 0.20–1.20)
Total Protein: 6.6 g/dL (ref 6.4–8.3)

## 2015-12-23 NOTE — Telephone Encounter (Signed)
Call received in [place] from [place] with [facility]. She states pt has been moved into assisted living area .  Per admission no orders noted for 81 mg baby aspirin daily. Family state pt is to be on this medication but not on [place]. ( pt may have been self administering in previous living area ).  Per chart no on med list per updated med list from ER visit 12/12/2015  Per last dictation on 11/22/2015 Dr Burr Medico states for pt to continue 81 mg baby aspirin.  This note will be forwarded to Dr Burr Medico for clarification of medications with request to fax medication list per Dr Burr Medico to [facility] at [phone] attention RCB.

## 2015-12-24 ENCOUNTER — Other Ambulatory Visit: Payer: Self-pay | Admitting: Hematology

## 2015-12-24 NOTE — Telephone Encounter (Signed)
Thu, could you call her assisted living facility to see if she can come in to see me on Thursday morning? Her plt has been pretty high, and I need to adjust her meds. Let them know to continue ASA 81mg  daily and hydrea for now.   Thanks,  Truitt Merle  12/24/2015

## 2015-12-24 NOTE — Telephone Encounter (Signed)
Spoke with [facility] @ [facility] and asked her to arrange for transportation to bring pt in for office visit with Dr. Burr Medico at 0900 on Wed. 12/25/15.   [name] [name] voiced understanding. [facility]   Phone    [phone].

## 2015-12-25 ENCOUNTER — Encounter: Payer: Self-pay | Admitting: Hematology

## 2015-12-25 ENCOUNTER — Ambulatory Visit (HOSPITAL_BASED_OUTPATIENT_CLINIC_OR_DEPARTMENT_OTHER): Payer: Medicare Other | Admitting: Hematology

## 2015-12-25 VITALS — BP 150/66 | HR 51 | Temp 97.9°F | Resp 17 | Ht 61.0 in | Wt 94.1 lb

## 2015-12-25 DIAGNOSIS — D473 Essential (hemorrhagic) thrombocythemia: Secondary | ICD-10-CM | POA: Diagnosis not present

## 2015-12-25 DIAGNOSIS — F0390 Unspecified dementia without behavioral disturbance: Secondary | ICD-10-CM

## 2015-12-25 DIAGNOSIS — R630 Anorexia: Secondary | ICD-10-CM | POA: Diagnosis not present

## 2015-12-25 DIAGNOSIS — D649 Anemia, unspecified: Secondary | ICD-10-CM

## 2015-12-25 MED ORDER — ANAGRELIDE HCL 0.5 MG PO CAPS: 0.5000 mg | ORAL_CAPSULE | Freq: Two times a day (BID) | ORAL | Status: DC

## 2015-12-25 NOTE — Progress Notes (Signed)
Hematology and Oncology Follow Up Visit Date of Visit: 12/25/2015   [name] [name] [id number] Mar 11, 1923 82 y.o. [name] [name] Spare, MD  Chief Complaint: Essential Thrombocytosis.  Principle Diagnosis: Essential thrombocytosis, intitiated on Hydrea on 07/05/2011; JAK2 mutation (-), BCR-ABL(-)   Previous therapy: Initially the patient was on Hydrea 1500 mg QOD and 1000 mg QOD, with significant drop in platelet count to 60,000 and neutropenia, hydrea was decreased, dose uncertain due to pt's dementia and memory issue since 11/07/2015   Current therapy:  Hydrea 500mg  on Saturday and Sunday,  Aspirin 81 mg for thromboprophylaxis.  Interim History:  [name] [name] returns to the office for follow-up. She is accompanied by her 2 sons who came from [place] of [place]. She was found to have worsening memory loss in the past several months, has difficulty to take care of her medications, and was recently removed from independently living to a assisted-living at [facility]. She actually has never stopped Hydrea as I told her a few months ago, according to the assisted-living, she has been on Hydrea 1 tablet daily on Saturday and Sunday. She had an episode of fever and UTI and was seen at the emergency room on 2/2, she was quite confused during that time, which is improved after her UTI resolved. She is very pleasant, but does not remember about the Hydrea dose she was taking.  Medications: I have reviewed the patient's current medications.  Current Outpatient Prescriptions  Medication Sig Dispense Refill  . anagrelide (AGRYLIN) 0.5 MG capsule Take 1 capsule (0.5 mg total) by mouth 2 (two) times daily. 60 capsule 0  . atenolol (TENORMIN) 50 MG tablet Take 25 mg by mouth 2 (two) times daily.     . calcitonin, salmon, (MIACALCIN/FORTICAL) 200 UNIT/ACT nasal spray USE 1 SPRAY IN ALTERNATING NOSTRILS ONCE A DAY FOR BONES 3.7 mL 11  . dorzolamide-timolol (COSOPT) 22.3-6.8 MG/ML ophthalmic solution Place 1 drop into both  eyes 2 (two) times daily.     . flurbiprofen (ANSAID) 100 MG tablet TAKE 1 TABLET BY MOUTH EVERY DAY 90 tablet 2  . hydroxyurea (HYDREA) 500 MG capsule TAKE 1 CAPSULES BY MOUTH DAILY ON Monday, Wednesday AND Friday, AND 2 TAB DAILY ON THE REST OF WEEK . (Patient taking differently: Take two capsules Monday thru Friday and One capsule on Saturday and Sunday) 50 capsule 1  . latanoprost (XALATAN) 0.005 % ophthalmic solution Place 1 drop into both eyes daily.    . Magnesium 250 MG TABS Take 1 tablet by mouth daily.     . nitrofurantoin (MACRODANTIN) 100 MG capsule Take one tablet by mouth twice daily for UTI    . saccharomyces boulardii (FLORASTOR) 250 MG capsule Take one capsule by mouth twice daily    . URELLE (URELLE/URISED) 81 MG TABS Take 1 tablet by mouth daily.     . vitamin E 400 UNIT capsule Take 400 Units by mouth every other day.     No current facility-administered medications for this visit.   Allergies:  Allergies  Allergen Reactions  . Darvocet [Propoxyphene N-Acetaminophen] Nausea And Vomiting  . Darvon Nausea And Vomiting  . Meperidine And Related Nausea And Vomiting  . Vicodin [Hydrocodone-Acetaminophen] Nausea And Vomiting  . Demerol [Meperidine]    Past Medical History, Surgical history, Social history, and Family History were reviewed and updated.  Review of Systems: Constitutional:  Negative for fever, chills, night sweats, anorexia, weight loss, pain. Cardiovascular: no chest pain or dyspnea on exertion Respiratory: no cough, shortness of breath, or  wheezing Neurological: no TIA or stroke symptoms, (+) memory loss Dermatological: negative for rash ENT: negative for - epistaxis or headaches Skin: Negative. Gastrointestinal: no abdominal pain, change in bowel habits, or black or bloody stools Genito-Urinary: no dysuria, trouble voiding, or hematuria Hematological and Lymphatic: negative for - bleeding problems or blood clots Breast: negative for breast  lumps Musculoskeletal: negative for - gait disturbance Remaining ROS negative.  Physical Exam: Blood pressure 150/66, pulse 51, temperature 97.9 F (36.6 C), temperature source Oral, resp. rate 17, height 5\' 1"  (1.549 m), weight 94 lb 1.6 oz (42.683 kg), SpO2 99 %. ECOG: 0 General appearance: alert, cooperative, appears stated age and no distress Head: Normocephalic, without obvious abnormality, atraumatic Neck: no adenopathy, supple, symmetrical, trachea midline and thyroid not enlarged, symmetric, no tenderness/mass/nodules HEENT: OP clear; PERRL; EOMi.  Lymph nodes: Cervical, supraclavicular, and axillary nodes normal. Heart:regular rate and rhythm, S1, S2 normal, no murmur, click, rub or gallop Lung:chest clear, no wheezing, rales, normal symmetric air entry Abdomin: soft, non-tender, without masses or organomegaly EXT:No peripheral edema   Lab Results: CBC Latest Ref Rng 12/23/2015 12/12/2015 11/22/2015  WBC 3.9 - 10.3 10e3/uL 5.1 7.8 3.7(L)  Hemoglobin 11.6 - 15.9 g/dL 11.0(L) 10.6(L) 10.4(L)  Hematocrit 34.8 - 46.6 % 34.7(L) 32.8(L) 30.4(L)  Platelets 145 - 400 10e3/uL 832(H) 538(H) 396    CMP Latest Ref Rng 12/23/2015 12/12/2015 10/28/2015  Glucose 70 - 140 mg/dl 91 158(H) 96  BUN 7.0 - 26.0 mg/dL 19.9 26(H) 17.9  Creatinine 0.6 - 1.1 mg/dL 0.9 1.10(H) 0.9  Sodium 136 - 145 mEq/L 140 143 143  Potassium 3.5 - 5.1 mEq/L 4.2 3.6 4.0  Chloride 101 - 111 mmol/L - 108 -  CO2 22 - 29 mEq/L 27 23 27   Calcium 8.4 - 10.4 mg/dL 8.9 8.8(L) 8.4  Total Protein 6.4 - 8.3 g/dL 6.6 6.4(L) 5.5(L)  Total Bilirubin 0.20 - 1.20 mg/dL 0.62 0.9 0.61  Alkaline Phos 40 - 150 U/L 66 55 45  AST 5 - 34 U/L 15 19 14   ALT 0 - 55 U/L 10 11(L) <9   ANC 2.4 today     Impression and Plan:  1. Essential thrombocythemia -- [name] [name] is an 82 year old woman with essential thrombocytosis, has been on Hydrea since 12/2011. -Due to her dementia and memory loss, she did not follow my previous  recommendation about Hydrea. -She is still on Hydrea, but does probably has been decreased. Her platelet count has significantly increased daily, was 832K 2 days ago. -Due to prior history of significant neutropenia, and the concerns of hydrea may have contributed to her memory loss and dementia, I'll stop Hydrea for now.  -I recommend her to start anagrelide 0.5 mg twice daily, dose will be adjusted every 1-2 weeks based on her platelet count.  -We reviewed the natural history of on essential thrombocythemia and the main complications which is venous and arterial thrombosis -She'll continue aspirin 81 mg daily  2. Dementia -she [facility]  -She'll follow-up with her primary care physician Dr. Nyoka Cowden   3. Anorexia  -her sons are concerned about her poor appetite -I recommend her to consider mirtazapine, we'll discuss with her primary care physician Dr. Nyoka Cowden  4. She will continue follow up with her PCP for other medical issues.    Plan:  - hold Hydrea -start anagrelide 0.5 mg twice daily, dose will be increased by 0.5mg /week every 1-2 weeks based on her CBC -She will [facility]  [facility], and the result will be faxed over to me -I gave her son a prescription of anagrelide and my written recommendations on her consultation sheet which will be bring back to [facility].  My nurse has communicated with her [facility] assisted[facility] by phone also -I will copy my note to her PCP Dr. Hervey Ard  -I will see her back in 4 weeks   Truitt Merle  12/25/2015

## 2015-12-26 ENCOUNTER — Non-Acute Institutional Stay: Payer: Medicare Other | Admitting: Nurse Practitioner

## 2015-12-26 ENCOUNTER — Encounter: Payer: Self-pay | Admitting: Nurse Practitioner

## 2015-12-26 DIAGNOSIS — D649 Anemia, unspecified: Secondary | ICD-10-CM

## 2015-12-26 DIAGNOSIS — I10 Essential (primary) hypertension: Secondary | ICD-10-CM | POA: Diagnosis not present

## 2015-12-26 DIAGNOSIS — R413 Other amnesia: Secondary | ICD-10-CM | POA: Diagnosis not present

## 2015-12-26 DIAGNOSIS — F4323 Adjustment disorder with mixed anxiety and depressed mood: Secondary | ICD-10-CM

## 2015-12-26 NOTE — Assessment & Plan Note (Signed)
12/12/15 Hgb 10.6

## 2015-12-26 NOTE — Progress Notes (Signed)
Patient ID: [name] [name], female   DOB: Nov 21, 1922, 82 y.o.   MRN: [id number]  Location: AL  [facility] Provider:  Marlana Latus NP  Code Status:  DNR Goals of care: Advanced Directive information Does patient have an advance directive?: Yes, Type of Advance Directive: Healthcare Power of Attorney  Chief Complaint  Patient presents with  . Anxiety    Family wants something for anxiety     HPI: Patient is a 82 y.o. female seen in the AL  at [facility] today for evaluation of  The patient's POA requesting a visit for mild med for anxiety, stated she gets fixated on something and anxious, but the patient stated she sleeps well, eats as usual-never a bid eater, she is anxious but adjusting. Hx of HTN, essential hypercythemia, memory  Review of Systems:  Review of Systems  Constitutional: Negative for fever and diaphoresis.  Eyes: Negative.   Respiratory: Negative.   Cardiovascular: Negative.   Gastrointestinal: Negative.   Genitourinary: Negative.   Musculoskeletal: Negative.   Neurological:       Memory loss  Endo/Heme/Allergies:       Thrombocythemia  Psychiatric/Behavioral: Negative.     Past Medical History  Diagnosis Date  . Glaucoma   . Hypertension   . Arthritis   . Neck fracture ([place])   . Abdominal pain, unspecified site 08/11/2012  . Essential thrombocythemia ([place]) 06/18/2011  . Anemia, unspecified 06/18/2011  . 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 Loss of weight 06/18/2011  . Lumbago 12/18/2010  . Sciatica 06/19/2010  . Memory loss 12/19/2009  . Diverticulosis of colon (without mention of hemorrhage) 04/20/2007  . Disorder of bone and cartilage, unspecified 04/20/2007  . Hemorrhage of gastrointestinal tract, unspecified 03/09/2007  . Other and unspecified hyperlipidemia 09/01/2006  . BBB (bundle branch block) 03/03/2006  . Cardiac dysrhythmia, unspecified 03/03/2006  . Chronic interstitial cystitis 03/03/2006  . Osteoarthrosis, unspecified whether generalized or  localized, unspecified site 03/03/2006  . Hematuria, unspecified 04/22/2004  . Herpes zoster without mention of complication [id number]  . 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 Adjustment disorder with mixed anxiety and depressed mood 12/26/2015  . UTI (urinary tract infection) 12/19/2015  . Osteoporosis 12/19/2015  . Essential thrombocythemia ([place]) 09/28/2014  . Hoarse 05/07/2013  . Hypertension   . Glaucoma 01/06/2012  . Anemia 06/18/2011  . Loss of weight 06/18/2011  . Memory loss 12/19/2009  . Hyperlipidemia 09/01/2006    Allergies  Allergen Reactions  . Darvocet [Propoxyphene N-Acetaminophen] Nausea And Vomiting  . Darvon Nausea And Vomiting  . Meperidine And Related Nausea And Vomiting  . Vicodin [Hydrocodone-Acetaminophen] Nausea And Vomiting  . Demerol [Meperidine]     Medications: Patient's Medications  New Prescriptions   No medications on file  Previous Medications   ANAGRELIDE (AGRYLIN) 0.5 MG CAPSULE    Take 1 capsule (0.5 mg total) by mouth 2 (two) times daily.   ASPIRIN 81 MG TABLET    Take 81 mg by mouth daily.   ATENOLOL (TENORMIN) 50 MG TABLET    Take 25 mg by mouth 2 (two) times daily.    CALCITONIN, SALMON, (MIACALCIN/FORTICAL) 200 UNIT/ACT NASAL SPRAY    USE 1 SPRAY IN ALTERNATING NOSTRILS ONCE A DAY FOR BONES   DORZOLAMIDE-TIMOLOL (COSOPT) 22.3-6.8 MG/ML OPHTHALMIC SOLUTION    Place 1 drop into both eyes 2 (two) times daily.    FLURBIPROFEN (ANSAID) 100 MG TABLET    TAKE 1 TABLET BY  MOUTH EVERY DAY   LATANOPROST (XALATAN) 0.005 % OPHTHALMIC SOLUTION    Place 1 drop into both eyes daily.   MAGNESIUM 250 MG TABS    Take 1 tablet by mouth daily.    URELLE (URELLE/URISED) 81 MG TABS    Take 1 tablet by mouth daily.    VITAMIN E 400 UNIT CAPSULE    Take 400 Units by mouth every other day.  Modified Medications   No medications on file  Discontinued Medications   HYDROXYUREA (HYDREA) 500 MG CAPSULE     TAKE 1 CAPSULES BY MOUTH DAILY ON Monday, Wednesday AND Friday, AND 2 TAB DAILY ON THE REST OF WEEK .   NITROFURANTOIN (MACRODANTIN) 100 MG CAPSULE    Take one tablet by mouth twice daily for UTI   SACCHAROMYCES BOULARDII (FLORASTOR) 250 MG CAPSULE    Take one capsule by mouth twice daily    Physical Exam: Filed Vitals:   12/26/15 1039  BP: 110/60  Pulse: 70  Temp: 98.3 F (36.8 C)  TempSrc: Oral  Resp: 18   There is no weight on file to calculate BMI.  Physical Exam  Constitutional: She is oriented to person, place, and time. She appears well-developed and well-nourished. No distress.  HENT:  Head: Normocephalic and atraumatic.  Right Ear: External ear normal.  Left Ear: External ear normal.  Nose: Nose normal.  Mouth/Throat: Oropharynx is clear and moist.  Eyes: Conjunctivae and EOM are normal. Pupils are equal, round, and reactive to light.  Corrective lenses  Neck: Neck supple. No JVD present. No tracheal deviation present. No thyromegaly present.  Cardiovascular: Normal rate and regular rhythm.  Exam reveals no gallop and no friction rub.   Murmur (2/6/LSB SEM) heard. Pulmonary/Chest: No respiratory distress. She has no wheezes. She has no rales.  Abdominal: She exhibits no distension and no mass. There is no tenderness.  Musculoskeletal: She exhibits no edema or tenderness.  Heberden's nodes. Bouchard's nodes. Contracture of the right hand.  Lymphadenopathy:    She has no cervical adenopathy.  Neurological: She is alert and oriented to person, place, and time. She has normal reflexes. No cranial nerve deficit. Coordination normal.  08/22/2015 MMSE 28/30. Failed clock drawing.  Skin: No rash noted. No erythema. No pallor.  Psychiatric: She has a normal mood and affect. Her behavior is normal. Judgment and thought content normal.    Labs reviewed: Basic Metabolic Panel:  Recent Labs  10/28/15 1035 12/12/15 1732 12/23/15 0917  NA 143 143 140  K 4.0 3.6 4.2  CL   --  108  --   CO2 27 23 27   GLUCOSE 96 158* 91  BUN 17.9 26* 19.9  CREATININE 0.9 1.10* 0.9  CALCIUM 8.4 8.8* 8.9    Liver Function Tests:  Recent Labs  10/28/15 1035 12/12/15 1732 12/23/15 0917  AST 14 19 15   ALT <9 11* 10  ALKPHOS 45 55 66  BILITOT 0.61 0.9 0.62  PROT 5.5* 6.4* 6.6  ALBUMIN 3.2* 3.4* 3.1*    CBC:  Recent Labs  11/22/15 0850 12/12/15 1732 12/23/15 0917  WBC 3.7* 7.8 5.1  NEUTROABS 2.4 6.4 3.9  HGB 10.4* 10.6* 11.0*  HCT 30.4* 32.8* 34.7*  MCV 136.0* 121.5* 117.6*  PLT 396 538* 832*    No results found for: TSH No results found for: HGBA1C Lab Results  Component Value Date   CHOL 178 02/12/2015   HDL 77* 02/12/2015   LDLCALC 83 02/12/2015   TRIG 92 02/12/2015  Significant Diagnostic Results since last visit: none  Patient Care Team: Estill Dooms, MD as PCP - General (Internal Medicine) [place] Tori Dattilio X, NP as Nurse Practitioner (Nurse Practitioner)  Assessment/Plan Problem List Items Addressed This Visit    Hypertension    Controlled, continue Atenolol 25mg  bid.       Relevant Medications   aspirin 81 MG tablet   Anemia    12/12/15 Hgb 10.6      Memory loss    12/16/15 MMSE 24/30, 25/30, continue AL for care needs.       Adjustment disorder with mixed anxiety and depressed mood - Primary    Continue observe the patient.           Family/ staff Communication: continue AL for care needs.   Labs/tests ordered:  none  ManXie Kenzli Barritt NP [facility] [address], [place] [address] On Call:  [phone] & follow prompts after 5pm & weekends Office Phone:  [phone] Office Fax:  [phone]

## 2015-12-26 NOTE — Assessment & Plan Note (Signed)
12/16/15 MMSE 24/30, 25/30, continue AL for care needs.

## 2015-12-26 NOTE — Assessment & Plan Note (Signed)
Continue observe the patient.

## 2015-12-26 NOTE — Assessment & Plan Note (Signed)
Controlled, continue Atenolol 25mg bid.  

## 2015-12-31 DIAGNOSIS — I10 Essential (primary) hypertension: Secondary | ICD-10-CM | POA: Diagnosis not present

## 2016-01-06 ENCOUNTER — Other Ambulatory Visit: Payer: Self-pay | Admitting: *Deleted

## 2016-01-06 ENCOUNTER — Telehealth: Payer: Self-pay | Admitting: *Deleted

## 2016-01-06 DIAGNOSIS — D473 Essential (hemorrhagic) thrombocythemia: Secondary | ICD-10-CM

## 2016-01-06 MED ORDER — UNABLE TO FIND: 1.0000 [IU] | Status: DC

## 2016-01-06 NOTE — Telephone Encounter (Signed)
Dr. Burr Medico reviewed lab results done 12/31/15 from [facility].  Spoke with Jonelle Sidle, nurse for pt today.  Confirmed with Tiffany that pt is taking Anagrelide 0.5 mg BID, and Aspirin 81 mg daily.  Informed Tiffany re:  Per Dr. Burr Medico, continue Anagrelide at current dose , and continue CBC  Weekly.   Faxed orders for above info to [place] as per her request. [employer]    [phone]     ;     Fax   [phone].

## 2016-01-06 NOTE — Telephone Encounter (Signed)
Called son [name] and left message on voice mail informing him of pt's platelets coming down nicely.  Pt to continue Anagrelide at current dose, and CBC to be monitored weekly - as per Dr. Ernestina Penna instructions.  Asked [name] to call collaborative nurse back confirming that he received message. [name]'s   Cell   Phone    [phone].

## 2016-01-07 DIAGNOSIS — M6281 Muscle weakness (generalized): Secondary | ICD-10-CM | POA: Diagnosis not present

## 2016-01-07 DIAGNOSIS — M899 Disorder of bone, unspecified: Secondary | ICD-10-CM | POA: Diagnosis not present

## 2016-01-07 DIAGNOSIS — R2681 Unsteadiness on feet: Secondary | ICD-10-CM | POA: Diagnosis not present

## 2016-01-07 DIAGNOSIS — I38 Endocarditis, valve unspecified: Secondary | ICD-10-CM | POA: Diagnosis not present

## 2016-01-07 DIAGNOSIS — M159 Polyosteoarthritis, unspecified: Secondary | ICD-10-CM | POA: Diagnosis not present

## 2016-01-07 DIAGNOSIS — Z87440 Personal history of urinary (tract) infections: Secondary | ICD-10-CM | POA: Diagnosis not present

## 2016-01-07 DIAGNOSIS — K5712 Diverticulitis of small intestine without perforation or abscess without bleeding: Secondary | ICD-10-CM | POA: Diagnosis not present

## 2016-01-07 DIAGNOSIS — R319 Hematuria, unspecified: Secondary | ICD-10-CM | POA: Diagnosis not present

## 2016-01-07 DIAGNOSIS — D709 Neutropenia, unspecified: Secondary | ICD-10-CM | POA: Diagnosis not present

## 2016-01-07 DIAGNOSIS — D485 Neoplasm of uncertain behavior of skin: Secondary | ICD-10-CM | POA: Diagnosis not present

## 2016-01-07 DIAGNOSIS — M5430 Sciatica, unspecified side: Secondary | ICD-10-CM | POA: Diagnosis not present

## 2016-01-07 DIAGNOSIS — N3289 Other specified disorders of bladder: Secondary | ICD-10-CM | POA: Diagnosis not present

## 2016-01-07 DIAGNOSIS — I499 Cardiac arrhythmia, unspecified: Secondary | ICD-10-CM | POA: Diagnosis not present

## 2016-01-07 DIAGNOSIS — N390 Urinary tract infection, site not specified: Secondary | ICD-10-CM | POA: Diagnosis not present

## 2016-01-07 DIAGNOSIS — I10 Essential (primary) hypertension: Secondary | ICD-10-CM | POA: Diagnosis not present

## 2016-01-07 DIAGNOSIS — E785 Hyperlipidemia, unspecified: Secondary | ICD-10-CM | POA: Diagnosis not present

## 2016-01-07 DIAGNOSIS — R58 Hemorrhage, not elsewhere classified: Secondary | ICD-10-CM | POA: Diagnosis not present

## 2016-01-07 DIAGNOSIS — M8580 Other specified disorders of bone density and structure, unspecified site: Secondary | ICD-10-CM | POA: Diagnosis not present

## 2016-01-08 ENCOUNTER — Emergency Department (HOSPITAL_COMMUNITY)
Admission: EM | Admit: 2016-01-08 | Discharge: 2016-01-08 | Disposition: A | Discharge status: Home / Self Care | Payer: Medicare Other | Attending: Emergency Medicine | Admitting: Emergency Medicine

## 2016-01-08 ENCOUNTER — Emergency Department (HOSPITAL_COMMUNITY): Payer: Medicare Other

## 2016-01-08 ENCOUNTER — Telehealth: Payer: Self-pay | Admitting: *Deleted

## 2016-01-08 ENCOUNTER — Encounter (HOSPITAL_COMMUNITY): Payer: Self-pay | Admitting: Emergency Medicine

## 2016-01-08 DIAGNOSIS — W1839XA Other fall on same level, initial encounter: Secondary | ICD-10-CM | POA: Insufficient documentation

## 2016-01-08 DIAGNOSIS — Z791 Long term (current) use of non-steroidal anti-inflammatories (NSAID): Secondary | ICD-10-CM | POA: Diagnosis not present

## 2016-01-08 DIAGNOSIS — S0180XA Unspecified open wound of other part of head, initial encounter: Secondary | ICD-10-CM | POA: Diagnosis not present

## 2016-01-08 DIAGNOSIS — Z7982 Long term (current) use of aspirin: Secondary | ICD-10-CM | POA: Insufficient documentation

## 2016-01-08 DIAGNOSIS — T148 Other injury of unspecified body region: Secondary | ICD-10-CM | POA: Diagnosis not present

## 2016-01-08 DIAGNOSIS — Y9289 Other specified places as the place of occurrence of the external cause: Secondary | ICD-10-CM | POA: Diagnosis not present

## 2016-01-08 DIAGNOSIS — Y998 Other external cause status: Secondary | ICD-10-CM | POA: Insufficient documentation

## 2016-01-08 DIAGNOSIS — S199XXA Unspecified injury of neck, initial encounter: Secondary | ICD-10-CM | POA: Diagnosis not present

## 2016-01-08 DIAGNOSIS — S0181XA Laceration without foreign body of other part of head, initial encounter: Secondary | ICD-10-CM

## 2016-01-08 DIAGNOSIS — Z8639 Personal history of other endocrine, nutritional and metabolic disease: Secondary | ICD-10-CM | POA: Diagnosis not present

## 2016-01-08 DIAGNOSIS — Z8781 Personal history of (healed) traumatic fracture: Secondary | ICD-10-CM | POA: Insufficient documentation

## 2016-01-08 DIAGNOSIS — Z87448 Personal history of other diseases of urinary system: Secondary | ICD-10-CM | POA: Insufficient documentation

## 2016-01-08 DIAGNOSIS — I10 Essential (primary) hypertension: Secondary | ICD-10-CM | POA: Insufficient documentation

## 2016-01-08 DIAGNOSIS — W19XXXA Unspecified fall, initial encounter: Secondary | ICD-10-CM

## 2016-01-08 DIAGNOSIS — R413 Other amnesia: Secondary | ICD-10-CM | POA: Diagnosis not present

## 2016-01-08 DIAGNOSIS — Z862 Personal history of diseases of the blood and blood-forming organs and certain disorders involving the immune mechanism: Secondary | ICD-10-CM | POA: Diagnosis not present

## 2016-01-08 DIAGNOSIS — Y9389 Activity, other specified: Secondary | ICD-10-CM | POA: Insufficient documentation

## 2016-01-08 DIAGNOSIS — Z8619 Personal history of other infectious and parasitic diseases: Secondary | ICD-10-CM | POA: Insufficient documentation

## 2016-01-08 DIAGNOSIS — Z79899 Other long term (current) drug therapy: Secondary | ICD-10-CM | POA: Diagnosis not present

## 2016-01-08 DIAGNOSIS — Z8719 Personal history of other diseases of the digestive system: Secondary | ICD-10-CM | POA: Diagnosis not present

## 2016-01-08 DIAGNOSIS — H409 Unspecified glaucoma: Secondary | ICD-10-CM | POA: Insufficient documentation

## 2016-01-08 DIAGNOSIS — M1990 Unspecified osteoarthritis, unspecified site: Secondary | ICD-10-CM | POA: Insufficient documentation

## 2016-01-08 DIAGNOSIS — S0990XA Unspecified injury of head, initial encounter: Secondary | ICD-10-CM | POA: Diagnosis not present

## 2016-01-08 LAB — CBC WITH DIFFERENTIAL/PLATELET
BASOS ABS: 0 10*3/uL (ref 0.0–0.1)
Basophils Relative: 0 %
EOS ABS: 0 10*3/uL (ref 0.0–0.7)
EOS PCT: 0 %
HEMATOCRIT: 36.1 % (ref 36.0–46.0)
Hemoglobin: 11.1 g/dL — ABNORMAL LOW (ref 12.0–15.0)
LYMPHS ABS: 0.5 10*3/uL — AB (ref 0.7–4.0)
Lymphocytes Relative: 11 %
MCH: 36 pg — ABNORMAL HIGH (ref 26.0–34.0)
MCHC: 30.7 g/dL (ref 30.0–36.0)
MCV: 117.2 fL — ABNORMAL HIGH (ref 78.0–100.0)
MONOS PCT: 14 %
Monocytes Absolute: 0.6 10*3/uL (ref 0.1–1.0)
NEUTROS ABS: 3.5 10*3/uL (ref 1.7–7.7)
Neutrophils Relative %: 75 %
Platelets: 193 10*3/uL (ref 150–400)
RBC: 3.08 MIL/uL — ABNORMAL LOW (ref 3.87–5.11)
RDW: 18.7 % — AB (ref 11.5–15.5)
WBC: 4.6 10*3/uL (ref 4.0–10.5)

## 2016-01-08 LAB — COMPREHENSIVE METABOLIC PANEL
ALBUMIN: 3.4 g/dL — AB (ref 3.5–5.0)
ALT: 14 U/L (ref 14–54)
ANION GAP: 9 (ref 5–15)
AST: 26 U/L (ref 15–41)
Alkaline Phosphatase: 47 U/L (ref 38–126)
BILIRUBIN TOTAL: 0.6 mg/dL (ref 0.3–1.2)
BUN: 21 mg/dL — ABNORMAL HIGH (ref 6–20)
CO2: 24 mmol/L (ref 22–32)
Calcium: 8.5 mg/dL — ABNORMAL LOW (ref 8.9–10.3)
Chloride: 107 mmol/L (ref 101–111)
Creatinine, Ser: 1.08 mg/dL — ABNORMAL HIGH (ref 0.44–1.00)
GFR calc Af Amer: 51 mL/min — ABNORMAL LOW (ref >60)
GFR calc non Af Amer: 44 mL/min — ABNORMAL LOW (ref >60)
GLUCOSE: 105 mg/dL — AB (ref 65–99)
POTASSIUM: 3.8 mmol/L (ref 3.5–5.1)
SODIUM: 140 mmol/L (ref 135–145)
TOTAL PROTEIN: 6.2 g/dL — AB (ref 6.5–8.1)

## 2016-01-08 MED ORDER — LIDOCAINE-EPINEPHRINE 2 %-1:100000 IJ SOLN
30.0000 mL | Freq: Once | INTRAMUSCULAR | Status: AC
  Administered 2016-01-08: 30 mL via INTRADERMAL
  Filled 2016-01-08: qty 2

## 2016-01-08 MED ORDER — BACITRACIN ZINC 500 UNIT/GM EX OINT
TOPICAL_OINTMENT | Freq: Two times a day (BID) | CUTANEOUS | Status: DC
  Administered 2016-01-08: 1 via TOPICAL
  Filled 2016-01-08: qty 0.9

## 2016-01-08 NOTE — Telephone Encounter (Signed)
Received faxed lab results done 01/07/16 from [facility].  Left results on md's desk for review.

## 2016-01-08 NOTE — ED Provider Notes (Signed)
CSN: [id number]     Arrival date & time 01/08/16  [id number] History   First MD Initiated Contact with Patient 01/08/16 0848     Chief Complaint  Patient presents with  . Fall     (Consider location/radiation/quality/duration/timing/severity/associated sxs/prior Treatment) HPI  Level V caveat due to early dementia and memory loss. 82 year old female who presents after fall. History of hypertension, hyperlipidemia, and does not take any anticoagulation. Head on a witnessed fall this morning. Patient states that she does not fully remember what had happened, but was standing at her doorway. States that she had a fall. Is unsure if she had lost balance or had syncopal episode. States that she was not sure if she had loss of consciousness after her fall. Sustained laceration to her forehead, and currently having some mild pain around her laceration. Denies chest pain, difficulty breathing, dizziness or palpitations, leg swelling or leg pain. No back pain, abdominal pain, or musculoskeletal injury.  Past Medical History  Diagnosis Date  . Glaucoma   . Hypertension   . Arthritis   . Neck fracture ([place])   . Abdominal pain, unspecified site 08/11/2012  . Essential thrombocythemia ([place]) 06/18/2011  . Anemia, unspecified 06/18/2011  . 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 Loss of weight 06/18/2011  . Lumbago 12/18/2010  . Sciatica 06/19/2010  . Memory loss 12/19/2009  . Diverticulosis of colon (without mention of hemorrhage) 04/20/2007  . Disorder of bone and cartilage, unspecified 04/20/2007  . Hemorrhage of gastrointestinal tract, unspecified 03/09/2007  . Other and unspecified hyperlipidemia 09/01/2006  . BBB (bundle branch block) 03/03/2006  . Cardiac dysrhythmia, unspecified 03/03/2006  . Chronic interstitial cystitis 03/03/2006  . Osteoarthrosis, unspecified whether generalized or localized, unspecified site 03/03/2006  . Hematuria, unspecified 04/22/2004  . Herpes zoster without mention of complication  [id number]  . 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 Appendectomy    . Tonsillectomy    . Abdominal hysterectomy    . Cataract extraction w/ intraocular lens  implant, bilateral    . Breast cyst excision Bilateral     benign   Family History  Problem Relation Age of Onset  . Heart disease Father     MI   Social History  Substance Use Topics  . Smoking status: Never Smoker   . Smokeless tobacco: Never Used  . Alcohol Use: No   OB History    No data available     Review of Systems 10/14 systems reviewed and are negative other than those stated in the HPI    Allergies  Darvocet; Darvon; Meperidine and related; Vicodin; and Demerol  Home Medications   Prior to Admission medications   Medication Sig Start Date End Date Taking? Authorizing Provider  anagrelide (AGRYLIN) 0.5 MG capsule Take 1 capsule (0.5 mg total) by mouth 2 (two) times daily. 12/25/15   Truitt Merle, MD  aspirin 81 MG tablet Take 81 mg by mouth daily.    Historical Provider, MD  atenolol (TENORMIN) 50 MG tablet Take 25 mg by mouth 2 (two) times daily.  09/25/11   Historical Provider, MD  calcitonin, salmon, (MIACALCIN/FORTICAL) 200 UNIT/ACT nasal spray USE 1 SPRAY IN ALTERNATING NOSTRILS ONCE A DAY FOR BONES 09/09/15   Estill Dooms, MD  dorzolamide-timolol (COSOPT) 22.3-6.8 MG/ML ophthalmic solution Place 1 drop into both eyes 2 (two) times daily.  03/09/14   Historical Provider, MD  flurbiprofen (ANSAID) 100 MG tablet TAKE 1 TABLET BY MOUTH EVERY DAY  09/13/15   Estill Dooms, MD  latanoprost (XALATAN) 0.005 % ophthalmic solution Place 1 drop into both eyes daily.    Historical Provider, MD  Magnesium 250 MG TABS Take 1 tablet by mouth daily.     Historical Provider, MD  UNABLE TO FIND 1 Units by Does not apply route once a week. PLEASE  CONTINUE  ANAGRELIDE AT CURRENT DOSE. CONTINUE  CBC  WEEKLY  AND  FAX  RESULTS  TO  DR.  Ernestina Penna  OFFICE     [phone] 01/06/16    Truitt Merle, MD  URELLE (URELLE/URISED) 81 MG TABS Take 1 tablet by mouth daily.  10/03/11   Historical Provider, MD  vitamin E 400 UNIT capsule Take 400 Units by mouth every other day.    Historical Provider, MD   BP 106/51 mmHg  Pulse 56  Temp(Src) 98.6 F (37 C) (Oral)  Resp 26  SpO2 90% Physical Exam Physical Exam  Nursing note and vitals reviewed. Constitutional: Elderly woman,  well developed, non-toxic, and in no acute distress Head: Normocephalic. 3 cm vertical laceration above the left eyebrow over the forehead. Mouth/Throat: Oropharynx is clear and moist.  Neck: Normal range of motion. Neck supple. No cervical spine tenderness. Cardiovascular: Normal rate and regular rhythm.   Pulmonary/Chest: Effort normal and breath sounds normal. No chest wall tenderness. Abdominal: Soft. There is no tenderness. There is no rebound and no guarding.  Musculoskeletal: Normal range of motion of all 4 extremities.  Neurological: Alert, no facial droop, fluent speech, moves all extremities symmetrically Skin: Skin is warm and dry. bruising noted to the right elbow. Small skin tear over left wrist. Psychiatric: Cooperative  ED Course  Procedures (including critical care time) Labs Review Labs Reviewed  CBC WITH DIFFERENTIAL/PLATELET - Abnormal; Notable for the following:    RBC 3.08 (*)    Hemoglobin 11.1 (*)    MCV 117.2 (*)    MCH 36.0 (*)    RDW 18.7 (*)    Lymphs Abs 0.5 (*)    All other components within normal limits  COMPREHENSIVE METABOLIC PANEL - Abnormal; Notable for the following:    Glucose, Bld 105 (*)    BUN 21 (*)    Creatinine, Ser 1.08 (*)    Calcium 8.5 (*)    Total Protein 6.2 (*)    Albumin 3.4 (*)    GFR calc non Af Amer 44 (*)    GFR calc Af Amer 51 (*)    All other components within normal limits    Imaging Review Ct Head Wo Contrast  01/08/2016  CLINICAL DATA:  Memory loss. Fall earlier today. Question transient loss of consciousness EXAM: CT HEAD WITHOUT  CONTRAST CT CERVICAL SPINE WITHOUT CONTRAST TECHNIQUE: Multidetector CT imaging of the head and cervical spine was performed following the standard protocol without intravenous contrast. Multiplanar CT image reconstructions of the cervical spine were also generated. COMPARISON:  None. FINDINGS: CT HEAD FINDINGS There is moderate diffuse atrophy. There is no intracranial mass, hemorrhage, extra-axial fluid collection, or midline shift. There is slight small vessel disease in the centra semiovale bilaterally. Elsewhere, gray-white compartments appear normal. No acute infarct evident. There is soft tissue swelling and air lateral to the superior, lateral left orbital rim. The bony calvarium in the orbital rim regions appear intact. Mastoid air cells are clear. No intraorbital lesions are apparent. There is patchy opacity in several ethmoid air cells bilaterally. CT CERVICAL SPINE FINDINGS There is no demonstrable fracture. There is slight anterolisthesis of  C7 on T1. There is no other spondylolisthesis. Prevertebral soft tissues and predental space regions are normal. There is marked disc space narrowing at all levels except for C7-T1. There is mild disc space narrowing at C7-T1. There is apparent ankylosis at C4-5. There is facet hypertrophy at essentially all levels bilaterally. There is no disc extrusion or high-grade stenosis. There is calcification in the both carotid arteries. There is symmetric apical pleural thickening bilaterally with mild calcification along the apical pleural surfaces bilaterally. There are benign-appearing calcifications in each lobe of the thyroid. 11 IMPRESSION: CT head: Atrophy with mild periventricular small vessel disease. No intracranial mass, hemorrhage, or extra-axial fluid collection. No acute infarct evident. Soft tissue injury with swelling and air lateral to the superior, lateral aspect of the left orbit. No fracture evident. There is patchy ethmoid sinus disease bilaterally. CT  cervical spine: Extensive arthropathy. Slight spondylolisthesis at C7-T1 is felt to be due to underlying spondylosis. No other spondylolisthesis. No acute fracture evident. Calcification is noted in each carotid artery. Electronically Signed   By: Lowella Grip III M.D.   On: 01/08/2016 10:23   Ct Cervical Spine Wo Contrast  01/08/2016  CLINICAL DATA:  Memory loss. Fall earlier today. Question transient loss of consciousness EXAM: CT HEAD WITHOUT CONTRAST CT CERVICAL SPINE WITHOUT CONTRAST TECHNIQUE: Multidetector CT imaging of the head and cervical spine was performed following the standard protocol without intravenous contrast. Multiplanar CT image reconstructions of the cervical spine were also generated. COMPARISON:  None. FINDINGS: CT HEAD FINDINGS There is moderate diffuse atrophy. There is no intracranial mass, hemorrhage, extra-axial fluid collection, or midline shift. There is slight small vessel disease in the centra semiovale bilaterally. Elsewhere, gray-white compartments appear normal. No acute infarct evident. There is soft tissue swelling and air lateral to the superior, lateral left orbital rim. The bony calvarium in the orbital rim regions appear intact. Mastoid air cells are clear. No intraorbital lesions are apparent. There is patchy opacity in several ethmoid air cells bilaterally. CT CERVICAL SPINE FINDINGS There is no demonstrable fracture. There is slight anterolisthesis of C7 on T1. There is no other spondylolisthesis. Prevertebral soft tissues and predental space regions are normal. There is marked disc space narrowing at all levels except for C7-T1. There is mild disc space narrowing at C7-T1. There is apparent ankylosis at C4-5. There is facet hypertrophy at essentially all levels bilaterally. There is no disc extrusion or high-grade stenosis. There is calcification in the both carotid arteries. There is symmetric apical pleural thickening bilaterally with mild calcification along  the apical pleural surfaces bilaterally. There are benign-appearing calcifications in each lobe of the thyroid. 11 IMPRESSION: CT head: Atrophy with mild periventricular small vessel disease. No intracranial mass, hemorrhage, or extra-axial fluid collection. No acute infarct evident. Soft tissue injury with swelling and air lateral to the superior, lateral aspect of the left orbit. No fracture evident. There is patchy ethmoid sinus disease bilaterally. CT cervical spine: Extensive arthropathy. Slight spondylolisthesis at C7-T1 is felt to be due to underlying spondylosis. No other spondylolisthesis. No acute fracture evident. Calcification is noted in each carotid artery. Electronically Signed   By: Lowella Grip III M.D.   On: 01/08/2016 10:23   I have personally reviewed and evaluated these images and lab results as part of my medical decision-making.   EKG Interpretation   Date/Time:  Wednesday January 08 2016 09:11:16 EST Ventricular Rate:  56 PR Interval:  185 QRS Duration: 131 QT Interval:  477 QTC Calculation: 460 R  Axis:   55 Text Interpretation:  Sinus rhythm Left bundle branch block  No prior EKG  Confirmed by Raahim Shartzer MD, Hinton Dyer [id number]) on 01/08/2016 10:32:04 AM      MDM   Final diagnoses:  Fall, initial encounter  Forehead laceration, initial encounter    82 year old female who presents with likely mechanical although unwitnessed fall. Vital signs non-concerning and she is well appearing overall. AT mental status baseline according to family members. Evidence of head trauma with facial laceration. Negative CT head and cervical spine. Laceration repaired. Blood unremarkable and EKG with known LBBB (documented on her medical list). Appropriate for discharge back to facility. Discussed wound care, follow-up care, and return precautions with son at bedside.     Forde Dandy, MD 01/08/16 2012

## 2016-01-08 NOTE — ED Notes (Signed)
Bed: WA05 Expected date:  Expected time:  Means of arrival:  Comments: EMS-fall 

## 2016-01-08 NOTE — ED Provider Notes (Signed)
LACERATION REPAIR Performed by: Ozella Almond Ward  Authorized by: Ozella Almond Ward Patient identity confirmed: provided demographic data Consent: Verbal consent obtained. Consent given by: patient  Risks and benefits: risks, benefits and alternatives were discussed Prepped and Draped in normal sterile fashion Wound explored Body area: Head Location details: Left forehead Laceration length: 2 cm Foreign bodies: No foreign bodies seen or palpated. Tendon involvement: none Nerve involvement: none Vascular damage: none Anesthesia: local infiltration Local anesthetic: lidocaine 2% with epinephrine Anesthetic total: 8 ml Patient sedated: no Irrigation solution: saline Irrigation method: syringe Amount of cleaning: standard Skin closure: 5-0 Number of sutures: 5 Technique: simple interrupted Approximation: close Dressing: antibiotic ointment Patient tolerance: Patient tolerated the procedure well with no immediate complications.   Ozella Almond Ward, PA-C 01/08/16 [place] Liu, MD 01/08/16 1901

## 2016-01-08 NOTE — ED Notes (Signed)
Per EMS-unwitnessed fall, found on floor, 1 inch floor to forehead-questionable LOC-oriented X 4-although does not remember what happened-appeared to be in the process of getting dressed-no blood thinners

## 2016-01-08 NOTE — Discharge Instructions (Signed)
Return for worsening symptoms, including worsening pain, altered mental status, vomiting and unable to keep down food/fluids, passing out, or any other symptoms concerning to you.  Have sutures removed in 5-7 days. Watch for signs of infection (described below)  Facial Laceration  A facial laceration is a cut on the face. These injuries can be painful and cause bleeding. Lacerations usually heal quickly, but they need special care to reduce scarring. DIAGNOSIS  Your health care provider will take a medical history, ask for details about how the injury occurred, and examine the wound to determine how deep the cut is. TREATMENT  Some facial lacerations may not require closure. Others may not be able to be closed because of an increased risk of infection. The risk of infection and the chance for successful closure will depend on various factors, including the amount of time since the injury occurred. The wound may be cleaned to help prevent infection. If closure is appropriate, pain medicines may be given if needed. Your health care provider will use stitches (sutures), wound glue (adhesive), or skin adhesive strips to repair the laceration. These tools bring the skin edges together to allow for faster healing and a better cosmetic outcome. If needed, you may also be given a tetanus shot. HOME CARE INSTRUCTIONS  Only take over-the-counter or prescription medicines as directed by your health care provider.  Follow your health care provider's instructions for wound care. These instructions will vary depending on the technique used for closing the wound. For Sutures:  Keep the wound clean and dry.   If you were given a bandage (dressing), you should change it at least once a day. Also change the dressing if it becomes wet or dirty, or as directed by your health care provider.   Wash the wound with soap and water 2 times a day. Rinse the wound off with water to remove all soap. Pat the wound dry  with a clean towel.   After cleaning, apply a thin layer of the antibiotic ointment recommended by your health care provider. This will help prevent infection and keep the dressing from sticking.   You may shower as usual after the first 24 hours. Do not soak the wound in water until the sutures are removed.   Get your sutures removed as directed by your health care provider. With facial lacerations, sutures should usually be taken out after 4-5 days to avoid stitch marks.   Wait a few days after your sutures are removed before applying any makeup. For Skin Adhesive Strips:  Keep the wound clean and dry.   Do not get the skin adhesive strips wet. You may bathe carefully, using caution to keep the wound dry.   If the wound gets wet, pat it dry with a clean towel.   Skin adhesive strips will fall off on their own. You may trim the strips as the wound heals. Do not remove skin adhesive strips that are still stuck to the wound. They will fall off in time.  For Wound Adhesive:  You may briefly wet your wound in the shower or bath. Do not soak or scrub the wound. Do not swim. Avoid periods of heavy sweating until the skin adhesive has fallen off on its own. After showering or bathing, gently pat the wound dry with a clean towel.   Do not apply liquid medicine, cream medicine, ointment medicine, or makeup to your wound while the skin adhesive is in place. This may loosen the film before your  wound is healed.   If a dressing is placed over the wound, be careful not to apply tape directly over the skin adhesive. This may cause the adhesive to be pulled off before the wound is healed.   Avoid prolonged exposure to sunlight or tanning lamps while the skin adhesive is in place.  The skin adhesive will usually remain in place for 5-10 days, then naturally fall off the skin. Do not pick at the adhesive film.  After Healing: Once the wound has healed, cover the wound with sunscreen during  the day for 1 full year. This can help minimize scarring. Exposure to ultraviolet light in the first year will darken the scar. It can take 1-2 years for the scar to lose its redness and to heal completely.  SEEK MEDICAL CARE IF:  You have a fever. SEEK IMMEDIATE MEDICAL CARE IF:  You have redness, pain, or swelling around the wound.   You see ayellowish-white fluid (pus) coming from the wound.    This information is not intended to replace advice given to you by your health care provider. Make sure you discuss any questions you have with your health care provider.   Document Released: 12/03/2004 Document Revised: 11/16/2014 Document Reviewed: 06/08/2013 Elsevier Interactive Patient Education 2016 [place], Adult A laceration is a cut that goes through all of the layers of the skin and into the tissue that is right under the skin. Some lacerations heal on their own. Others need to be closed with stitches (sutures), staples, skin adhesive strips, or skin glue. Proper laceration care minimizes the risk of infection and helps the laceration to heal better. HOW TO CARE FOR YOUR LACERATION If sutures or staples were used:  Keep the wound clean and dry.  If you were given a bandage (dressing), you should change it at least one time per day or as told by your health care provider. You should also change it if it becomes wet or dirty.  Keep the wound completely dry for the first 24 hours or as told by your health care provider. After that time, you may shower or bathe. However, make sure that the wound is not soaked in water until after the sutures or staples have been removed.  Clean the wound one time each day or as told by your health care provider:  Wash the wound with soap and water.  Rinse the wound with water to remove all soap.  Pat the wound dry with a clean towel. Do not rub the wound.  After cleaning the wound, apply a thin layer of antibiotic  ointmentas told by your health care provider. This will help to prevent infection and keep the dressing from sticking to the wound.  Have the sutures or staples removed as told by your health care provider. If skin adhesive strips were used:  Keep the wound clean and dry.  If you were given a bandage (dressing), you should change it at least one time per day or as told by your health care provider. You should also change it if it becomes dirty or wet.  Do not get the skin adhesive strips wet. You may shower or bathe, but be careful to keep the wound dry.  If the wound gets wet, pat it dry with a clean towel. Do not rub the wound.  Skin adhesive strips fall off on their own. You may trim the strips as the wound heals. Do not remove skin adhesive strips that are  still stuck to the wound. They will fall off in time. If skin glue was used:  Try to keep the wound dry, but you may briefly wet it in the shower or bath. Do not soak the wound in water, such as by swimming.  After you have showered or bathed, gently pat the wound dry with a clean towel. Do not rub the wound.  Do not do any activities that will make you sweat heavily until the skin glue has fallen off on its own.  Do not apply liquid, cream, or ointment medicine to the wound while the skin glue is in place. Using those may loosen the film before the wound has healed.  If you were given a bandage (dressing), you should change it at least one time per day or as told by your health care provider. You should also change it if it becomes dirty or wet.  If a dressing is placed over the wound, be careful not to apply tape directly over the skin glue. Doing that may cause the glue to be pulled off before the wound has healed.  Do not pick at the glue. The skin glue usually remains in place for 5-10 days, then it falls off of the skin. General Instructions  Take over-the-counter and prescription medicines only as told by your health care  provider.  If you were prescribed an antibiotic medicine or ointment, take or apply it as told by your doctor. Do not stop using it even if your condition improves.  To help prevent scarring, make sure to cover your wound with sunscreen whenever you are outside after stitches are removed, after adhesive strips are removed, or when glue remains in place and the wound is healed. Make sure to wear a sunscreen of at least 30 SPF.  Do not scratch or pick at the wound.  Keep all follow-up visits as told by your health care provider. This is important.  Check your wound every day for signs of infection. Watch for:  Redness, swelling, or pain.  Fluid, blood, or pus.  Raise (elevate) the injured area above the level of your heart while you are sitting or lying down, if possible. SEEK MEDICAL CARE IF:  You received a tetanus shot and you have swelling, severe pain, redness, or bleeding at the injection site.  You have a fever.  A wound that was closed breaks open.  You notice a bad smell coming from your wound or your dressing.  You notice something coming out of the wound, such as wood or glass.  Your pain is not controlled with medicine.  You have increased redness, swelling, or pain at the site of your wound.  You have fluid, blood, or pus coming from your wound.  You notice a change in the color of your skin near your wound.  You need to change the dressing frequently due to fluid, blood, or pus draining from the wound.  You develop a new rash.  You develop numbness around the wound. SEEK IMMEDIATE MEDICAL CARE IF:  You develop severe swelling around the wound.  Your pain suddenly increases and is severe.  You develop painful lumps near the wound or on skin that is anywhere on your body.  You have a red streak going away from your wound.  The wound is on your hand or foot and you cannot properly move a finger or toe.  The wound is on your hand or foot and you notice  that your fingers or  toes look pale or bluish.   This information is not intended to replace advice given to you by your health care provider. Make sure you discuss any questions you have with your health care provider.   Document Released: 10/26/2005 Document Revised: 03/12/2015 Document Reviewed: 10/22/2014 Elsevier Interactive Patient Education [employer].

## 2016-01-09 ENCOUNTER — Non-Acute Institutional Stay: Payer: Medicare Other | Admitting: Nurse Practitioner

## 2016-01-09 ENCOUNTER — Encounter: Payer: Self-pay | Admitting: Nurse Practitioner

## 2016-01-09 DIAGNOSIS — R413 Other amnesia: Secondary | ICD-10-CM | POA: Diagnosis not present

## 2016-01-09 DIAGNOSIS — F4323 Adjustment disorder with mixed anxiety and depressed mood: Secondary | ICD-10-CM | POA: Diagnosis not present

## 2016-01-09 DIAGNOSIS — I10 Essential (primary) hypertension: Secondary | ICD-10-CM | POA: Diagnosis not present

## 2016-01-09 DIAGNOSIS — D649 Anemia, unspecified: Secondary | ICD-10-CM | POA: Diagnosis not present

## 2016-01-09 DIAGNOSIS — S0191XS Laceration without foreign body of unspecified part of head, sequela: Secondary | ICD-10-CM | POA: Diagnosis not present

## 2016-01-09 DIAGNOSIS — D473 Essential (hemorrhagic) thrombocythemia: Secondary | ICD-10-CM | POA: Diagnosis not present

## 2016-01-09 NOTE — Progress Notes (Signed)
Patient ID: [name] [name], female   DOB: [birth year], 82 y.o.   MRN: [id number]  Location:  [place] Room Number: [id number] Place of Service: [facility] Provider:  Lennie Odor Mast NP  GREEN, [name], MD  Patient Care Team: Estill Dooms, MD as PCP - General (Internal Medicine) Downey Man Mast X, NP as Nurse Practitioner (Nurse Practitioner)  Extended Emergency Contact Information Primary Emergency Contact: [name] [place] Phone: [phone] Relation: Friend  Code Status:  DNR Goals of care: Advanced Directive information Advanced Directives 01/09/2016  Does patient have an advance directive? Yes  Type of Advance Directive [place]  Does patient want to make changes to advanced directive? No - Patient declined  Copy of advanced directive(s) in chart? Yes  Would patient like information on creating an advanced directive? -     Chief Complaint  Patient presents with  . Hospitalization Follow-up    Head injury    HPI:  Pt is a 82 y.o. female seen today for medical management of chronic diseases.  ED eval 01/08/16, left head trauma with facial laceration. Negative CT head and cervical spine. Laceration repaired. Blood unremarkable and EKG with known LBBB.    Past Medical History  Diagnosis Date  . Glaucoma   . Hypertension   . Arthritis   . Neck fracture ([place])   . Abdominal pain, unspecified site 08/11/2012  . Essential thrombocythemia ([place]) 06/18/2011  . Anemia, unspecified 06/18/2011  . 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 Loss of weight 06/18/2011  . Lumbago 12/18/2010  . Sciatica 06/19/2010  . Memory loss 12/19/2009  . Diverticulosis of colon (without mention of hemorrhage) 04/20/2007  . Disorder of bone and cartilage, unspecified 04/20/2007  . Hemorrhage of gastrointestinal tract, unspecified 03/09/2007  . Other and unspecified hyperlipidemia 09/01/2006  . BBB (bundle branch block) 03/03/2006  .  Cardiac dysrhythmia, unspecified 03/03/2006  . Chronic interstitial cystitis 03/03/2006  . Osteoarthrosis, unspecified whether generalized or localized, unspecified site 03/03/2006  . Hematuria, unspecified 04/22/2004  . Herpes zoster without mention of complication [id number]  . 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 Appendectomy    . Tonsillectomy    . Abdominal hysterectomy    . Cataract extraction w/ intraocular lens  implant, bilateral    . Breast cyst excision Bilateral     benign    Allergies  Allergen Reactions  . Darvocet [Propoxyphene N-Acetaminophen] Nausea And Vomiting  . Darvon Nausea And Vomiting  . Meperidine And Related Nausea And Vomiting  . Vicodin [Hydrocodone-Acetaminophen] Nausea And Vomiting  . Demerol [Meperidine]       Medication List       This list is accurate as of: 01/09/16 11:59 PM.  Always use your most recent med list.               acetaminophen 325 MG tablet  Commonly known as:  TYLENOL  Take 650 mg by mouth every 6 (six) hours as needed.     anagrelide 0.5 MG capsule  Commonly known as:  AGRYLIN  Take 1 capsule (0.5 mg total) by mouth 2 (two) times daily.     aspirin 81 MG tablet  Take 81 mg by mouth daily.     atenolol 50 MG tablet  Commonly known as:  TENORMIN  Take 25 mg by mouth 2 (two) times daily.     calcitonin (salmon) 200 UNIT/ACT nasal spray  Commonly known as:  MIACALCIN/FORTICAL  USE 1 SPRAY IN ALTERNATING NOSTRILS ONCE A DAY FOR BONES     flurbiprofen 100 MG tablet  Commonly known as:  ANSAID  TAKE 1 TABLET BY MOUTH EVERY DAY     latanoprost 0.005 % ophthalmic solution  Commonly known as:  XALATAN  Place 1 drop into both eyes daily.     Magnesium 250 MG Tabs  Take 1 tablet by mouth daily.     URELLE 81 MG Tabs tablet  Take 1 tablet by mouth daily.     vitamin E 400 UNIT capsule  Take 400 Units by mouth every other day.        Review of Systems    Constitutional: Negative for fever and diaphoresis.  Eyes: Negative.   Respiratory: Negative.   Cardiovascular: Negative.   Gastrointestinal: Negative.   Genitourinary: Negative.   Musculoskeletal: Negative.   Skin: Positive for wound.       Left forehead traumatic laceration, sutures intact, no s/s of infection.   Neurological:       Memory loss  Hematological:       Thrombocythemia  Psychiatric/Behavioral: Negative.     Immunization History  Administered Date(s) Administered  . Influenza Whole 08/09/2012, 07/10/2013  . Influenza-Unspecified 08/14/2014, 08/08/2015  . Pneumococcal Polysaccharide-23 11/09/2004  . Zoster 10/07/2006   Pertinent  Health Maintenance Due  Topic Date Due  . PNA vac Low Risk Adult (2 of 2 - PCV13) 11/09/2005  . INFLUENZA VACCINE  06/09/2016  . DEXA SCAN  Completed   Fall Risk  12/19/2015 08/22/2015 02/21/2015 09/07/2014 03/22/2014  Falls in the past year? No No No No No   Functional Status Survey:    Filed Vitals:   01/09/16 1106  BP: 136/70  Pulse: 74  Temp: 99.1 F (37.3 C)  TempSrc: Oral  Resp: 20   There is no weight on file to calculate BMI. Physical Exam  Constitutional: She is oriented to person, place, and time. She appears well-developed and well-nourished. No distress.  HENT:  Head: Normocephalic and atraumatic.  Right Ear: External ear normal.  Left Ear: External ear normal.  Nose: Nose normal.  Mouth/Throat: Oropharynx is clear and moist.  Eyes: Conjunctivae and EOM are normal. Pupils are equal, round, and reactive to light.  Corrective lenses  Neck: Neck supple. No JVD present. No tracheal deviation present. No thyromegaly present.  Cardiovascular: Normal rate and regular rhythm.  Exam reveals no gallop and no friction rub.   Murmur (2/6/LSB SEM) heard. Pulmonary/Chest: No respiratory distress. She has no wheezes. She has no rales.  Abdominal: She exhibits no distension and no mass. There is no tenderness.   Musculoskeletal: She exhibits no edema or tenderness.  Heberden's nodes. Bouchard's nodes. Contracture of the right hand.  Lymphadenopathy:    She has no cervical adenopathy.  Neurological: She is alert and oriented to person, place, and time. She has normal reflexes. No cranial nerve deficit. Coordination normal.  08/22/2015 MMSE 28/30. Failed clock drawing.  Skin: No rash noted. No erythema. No pallor.  Left forehead traumatic laceration, sutures intact, no s/s of infection.   Psychiatric: She has a normal mood and affect. Her behavior is normal. Judgment and thought content normal.    Labs reviewed:  Recent Labs  12/12/15 1732 12/23/15 0917 01/08/16 0917  NA 143 140 140  K 3.6 4.2 3.8  CL 108  --  107  CO2 23 27 24   GLUCOSE 158* 91 105*  BUN 26* 19.9 21*  CREATININE 1.10* 0.9 1.08*  CALCIUM 8.8* 8.9 8.5*    Recent Labs  12/12/15 1732 12/23/15 0917 01/08/16 0917  AST 19 15 26   ALT 11* 10 14  ALKPHOS 55 66 47  BILITOT 0.9 0.62 0.6  PROT 6.4* 6.6 6.2*  ALBUMIN 3.4* 3.1* 3.4*    Recent Labs  12/12/15 1732 12/23/15 0917 01/08/16 0917  WBC 7.8 5.1 4.6  NEUTROABS 6.4 3.9 3.5  HGB 10.6* 11.0* 11.1*  HCT 32.8* 34.7* 36.1  MCV 121.5* 117.6* 117.2*  PLT 538* 832* 193   No results found for: TSH No results found for: HGBA1C Lab Results  Component Value Date   CHOL 178 02/12/2015   HDL 77* 02/12/2015   LDLCALC 83 02/12/2015   TRIG 92 02/12/2015    Significant Diagnostic Results in last 30 days:  Ct Head Wo Contrast  01/08/2016  CLINICAL DATA:  Memory loss. Fall earlier today. Question transient loss of consciousness EXAM: CT HEAD WITHOUT CONTRAST CT CERVICAL SPINE WITHOUT CONTRAST TECHNIQUE: Multidetector CT imaging of the head and cervical spine was performed following the standard protocol without intravenous contrast. Multiplanar CT image reconstructions of the cervical spine were also generated. COMPARISON:  None. FINDINGS: CT HEAD FINDINGS There is  moderate diffuse atrophy. There is no intracranial mass, hemorrhage, extra-axial fluid collection, or midline shift. There is slight small vessel disease in the centra semiovale bilaterally. Elsewhere, gray-white compartments appear normal. No acute infarct evident. There is soft tissue swelling and air lateral to the superior, lateral left orbital rim. The bony calvarium in the orbital rim regions appear intact. Mastoid air cells are clear. No intraorbital lesions are apparent. There is patchy opacity in several ethmoid air cells bilaterally. CT CERVICAL SPINE FINDINGS There is no demonstrable fracture. There is slight anterolisthesis of C7 on T1. There is no other spondylolisthesis. Prevertebral soft tissues and predental space regions are normal. There is marked disc space narrowing at all levels except for C7-T1. There is mild disc space narrowing at C7-T1. There is apparent ankylosis at C4-5. There is facet hypertrophy at essentially all levels bilaterally. There is no disc extrusion or high-grade stenosis. There is calcification in the both carotid arteries. There is symmetric apical pleural thickening bilaterally with mild calcification along the apical pleural surfaces bilaterally. There are benign-appearing calcifications in each lobe of the thyroid. 11 IMPRESSION: CT head: Atrophy with mild periventricular small vessel disease. No intracranial mass, hemorrhage, or extra-axial fluid collection. No acute infarct evident. Soft tissue injury with swelling and air lateral to the superior, lateral aspect of the left orbit. No fracture evident. There is patchy ethmoid sinus disease bilaterally. CT cervical spine: Extensive arthropathy. Slight spondylolisthesis at C7-T1 is felt to be due to underlying spondylosis. No other spondylolisthesis. No acute fracture evident. Calcification is noted in each carotid artery. Electronically Signed   By: Lowella Grip III M.D.   On: 01/08/2016 10:23   Ct Cervical Spine  Wo Contrast  01/08/2016  CLINICAL DATA:  Memory loss. Fall earlier today. Question transient loss of consciousness EXAM: CT HEAD WITHOUT CONTRAST CT CERVICAL SPINE WITHOUT CONTRAST TECHNIQUE: Multidetector CT imaging of the head and cervical spine was performed following the standard protocol without intravenous contrast. Multiplanar CT image reconstructions of the cervical spine were also generated. COMPARISON:  None. FINDINGS: CT HEAD FINDINGS There is moderate diffuse atrophy. There is no intracranial mass, hemorrhage, extra-axial fluid collection, or midline shift. There is slight small vessel disease in the centra semiovale bilaterally. Elsewhere, gray-white compartments appear normal.  No acute infarct evident. There is soft tissue swelling and air lateral to the superior, lateral left orbital rim. The bony calvarium in the orbital rim regions appear intact. Mastoid air cells are clear. No intraorbital lesions are apparent. There is patchy opacity in several ethmoid air cells bilaterally. CT CERVICAL SPINE FINDINGS There is no demonstrable fracture. There is slight anterolisthesis of C7 on T1. There is no other spondylolisthesis. Prevertebral soft tissues and predental space regions are normal. There is marked disc space narrowing at all levels except for C7-T1. There is mild disc space narrowing at C7-T1. There is apparent ankylosis at C4-5. There is facet hypertrophy at essentially all levels bilaterally. There is no disc extrusion or high-grade stenosis. There is calcification in the both carotid arteries. There is symmetric apical pleural thickening bilaterally with mild calcification along the apical pleural surfaces bilaterally. There are benign-appearing calcifications in each lobe of the thyroid. 11 IMPRESSION: CT head: Atrophy with mild periventricular small vessel disease. No intracranial mass, hemorrhage, or extra-axial fluid collection. No acute infarct evident. Soft tissue injury with swelling and  air lateral to the superior, lateral aspect of the left orbit. No fracture evident. There is patchy ethmoid sinus disease bilaterally. CT cervical spine: Extensive arthropathy. Slight spondylolisthesis at C7-T1 is felt to be due to underlying spondylosis. No other spondylolisthesis. No acute fracture evident. Calcification is noted in each carotid artery. Electronically Signed   By: Lowella Grip III M.D.   On: 01/08/2016 10:23    Assessment/Plan  Laceration of head Left forehead traumatic laceration, sutures intact, no s/s of infection. Suture removal 7 days.   Hypertension Controlled, continue Atenolol 25mg  bid.   Anemia 12/12/15 Hgb 10.6  Memory loss 12/16/15 MMSE 24/30, 25/30, continue AL for care needs. Supervision for safety  Essential thrombocythemia ([place]) Plt 500s while in ED 12/12/15,f/u plt 01/07/16 262, takes Hydroxyurea 500mg  II M-F, I sat and sun. F/u oncology.   Adjustment disorder with mixed anxiety and depressed mood Continue observe the patient.     Family/ staff Communication: continue AL for care needs.   Labs/tests ordered:  none

## 2016-01-10 DIAGNOSIS — D709 Neutropenia, unspecified: Secondary | ICD-10-CM | POA: Diagnosis not present

## 2016-01-10 DIAGNOSIS — E785 Hyperlipidemia, unspecified: Secondary | ICD-10-CM | POA: Diagnosis not present

## 2016-01-10 DIAGNOSIS — R2681 Unsteadiness on feet: Secondary | ICD-10-CM | POA: Diagnosis not present

## 2016-01-10 DIAGNOSIS — I38 Endocarditis, valve unspecified: Secondary | ICD-10-CM | POA: Diagnosis not present

## 2016-01-10 DIAGNOSIS — M6281 Muscle weakness (generalized): Secondary | ICD-10-CM | POA: Diagnosis not present

## 2016-01-10 DIAGNOSIS — R319 Hematuria, unspecified: Secondary | ICD-10-CM | POA: Diagnosis not present

## 2016-01-10 DIAGNOSIS — I499 Cardiac arrhythmia, unspecified: Secondary | ICD-10-CM | POA: Diagnosis not present

## 2016-01-10 DIAGNOSIS — R58 Hemorrhage, not elsewhere classified: Secondary | ICD-10-CM | POA: Diagnosis not present

## 2016-01-10 DIAGNOSIS — M159 Polyosteoarthritis, unspecified: Secondary | ICD-10-CM | POA: Diagnosis not present

## 2016-01-10 DIAGNOSIS — D485 Neoplasm of uncertain behavior of skin: Secondary | ICD-10-CM | POA: Diagnosis not present

## 2016-01-10 DIAGNOSIS — K5712 Diverticulitis of small intestine without perforation or abscess without bleeding: Secondary | ICD-10-CM | POA: Diagnosis not present

## 2016-01-10 DIAGNOSIS — Z87440 Personal history of urinary (tract) infections: Secondary | ICD-10-CM | POA: Diagnosis not present

## 2016-01-10 DIAGNOSIS — M8580 Other specified disorders of bone density and structure, unspecified site: Secondary | ICD-10-CM | POA: Diagnosis not present

## 2016-01-10 DIAGNOSIS — N390 Urinary tract infection, site not specified: Secondary | ICD-10-CM | POA: Diagnosis not present

## 2016-01-10 DIAGNOSIS — M5430 Sciatica, unspecified side: Secondary | ICD-10-CM | POA: Diagnosis not present

## 2016-01-10 DIAGNOSIS — M899 Disorder of bone, unspecified: Secondary | ICD-10-CM | POA: Diagnosis not present

## 2016-01-10 DIAGNOSIS — N3289 Other specified disorders of bladder: Secondary | ICD-10-CM | POA: Diagnosis not present

## 2016-01-10 DIAGNOSIS — I10 Essential (primary) hypertension: Secondary | ICD-10-CM | POA: Diagnosis not present

## 2016-01-13 DIAGNOSIS — M6281 Muscle weakness (generalized): Secondary | ICD-10-CM | POA: Diagnosis not present

## 2016-01-13 DIAGNOSIS — I10 Essential (primary) hypertension: Secondary | ICD-10-CM | POA: Diagnosis not present

## 2016-01-13 DIAGNOSIS — R319 Hematuria, unspecified: Secondary | ICD-10-CM | POA: Diagnosis not present

## 2016-01-13 DIAGNOSIS — E785 Hyperlipidemia, unspecified: Secondary | ICD-10-CM | POA: Diagnosis not present

## 2016-01-13 DIAGNOSIS — N390 Urinary tract infection, site not specified: Secondary | ICD-10-CM | POA: Diagnosis not present

## 2016-01-13 DIAGNOSIS — R2681 Unsteadiness on feet: Secondary | ICD-10-CM | POA: Diagnosis not present

## 2016-01-13 DIAGNOSIS — S0191XA Laceration without foreign body of unspecified part of head, initial encounter: Secondary | ICD-10-CM | POA: Insufficient documentation

## 2016-01-13 NOTE — Assessment & Plan Note (Addendum)
Plt 500s while in ED 12/12/15,f/u plt 01/07/16 262, takes Hydroxyurea 500mg  II M-F, I sat and sun. F/u oncology.

## 2016-01-13 NOTE — Assessment & Plan Note (Signed)
Continue observe the patient.

## 2016-01-13 NOTE — Assessment & Plan Note (Signed)
12/12/15 Hgb 10.6

## 2016-01-13 NOTE — Assessment & Plan Note (Signed)
Left forehead traumatic laceration, sutures intact, no s/s of infection. Suture removal 7 days.

## 2016-01-13 NOTE — Assessment & Plan Note (Signed)
12/16/15 MMSE 24/30, 25/30, continue AL for care needs. Supervision for safety

## 2016-01-13 NOTE — Assessment & Plan Note (Signed)
Controlled, continue Atenolol 25mg bid.  

## 2016-01-14 DIAGNOSIS — I10 Essential (primary) hypertension: Secondary | ICD-10-CM | POA: Diagnosis not present

## 2016-01-14 LAB — CBC AND DIFFERENTIAL
HCT: 32 % — AB (ref 36–46)
Hemoglobin: 10.7 g/dL — AB (ref 12.0–16.0)
Platelets: 217 10*3/uL (ref 150–399)
WBC: 3.9 10^3/mL

## 2016-01-15 DIAGNOSIS — R319 Hematuria, unspecified: Secondary | ICD-10-CM | POA: Diagnosis not present

## 2016-01-15 DIAGNOSIS — N390 Urinary tract infection, site not specified: Secondary | ICD-10-CM | POA: Diagnosis not present

## 2016-01-15 DIAGNOSIS — R2681 Unsteadiness on feet: Secondary | ICD-10-CM | POA: Diagnosis not present

## 2016-01-15 DIAGNOSIS — I10 Essential (primary) hypertension: Secondary | ICD-10-CM | POA: Diagnosis not present

## 2016-01-15 DIAGNOSIS — E785 Hyperlipidemia, unspecified: Secondary | ICD-10-CM | POA: Diagnosis not present

## 2016-01-15 DIAGNOSIS — M6281 Muscle weakness (generalized): Secondary | ICD-10-CM | POA: Diagnosis not present

## 2016-01-17 DIAGNOSIS — R319 Hematuria, unspecified: Secondary | ICD-10-CM | POA: Diagnosis not present

## 2016-01-17 DIAGNOSIS — M6281 Muscle weakness (generalized): Secondary | ICD-10-CM | POA: Diagnosis not present

## 2016-01-17 DIAGNOSIS — N390 Urinary tract infection, site not specified: Secondary | ICD-10-CM | POA: Diagnosis not present

## 2016-01-17 DIAGNOSIS — E785 Hyperlipidemia, unspecified: Secondary | ICD-10-CM | POA: Diagnosis not present

## 2016-01-17 DIAGNOSIS — I10 Essential (primary) hypertension: Secondary | ICD-10-CM | POA: Diagnosis not present

## 2016-01-17 DIAGNOSIS — R2681 Unsteadiness on feet: Secondary | ICD-10-CM | POA: Diagnosis not present

## 2016-01-20 ENCOUNTER — Encounter: Payer: Self-pay | Admitting: Hematology

## 2016-01-20 ENCOUNTER — Telehealth: Payer: Self-pay | Admitting: Hematology

## 2016-01-20 ENCOUNTER — Other Ambulatory Visit (HOSPITAL_BASED_OUTPATIENT_CLINIC_OR_DEPARTMENT_OTHER): Payer: Medicare Other

## 2016-01-20 ENCOUNTER — Ambulatory Visit (HOSPITAL_BASED_OUTPATIENT_CLINIC_OR_DEPARTMENT_OTHER): Payer: Medicare Other | Admitting: Hematology

## 2016-01-20 VITALS — BP 131/60 | HR 69 | Temp 97.9°F | Resp 18 | Ht 61.0 in | Wt 91.2 lb

## 2016-01-20 DIAGNOSIS — R630 Anorexia: Secondary | ICD-10-CM

## 2016-01-20 DIAGNOSIS — D649 Anemia, unspecified: Secondary | ICD-10-CM

## 2016-01-20 DIAGNOSIS — I10 Essential (primary) hypertension: Secondary | ICD-10-CM | POA: Diagnosis not present

## 2016-01-20 DIAGNOSIS — M6281 Muscle weakness (generalized): Secondary | ICD-10-CM | POA: Diagnosis not present

## 2016-01-20 DIAGNOSIS — D473 Essential (hemorrhagic) thrombocythemia: Secondary | ICD-10-CM | POA: Diagnosis not present

## 2016-01-20 DIAGNOSIS — E785 Hyperlipidemia, unspecified: Secondary | ICD-10-CM | POA: Diagnosis not present

## 2016-01-20 DIAGNOSIS — R319 Hematuria, unspecified: Secondary | ICD-10-CM | POA: Diagnosis not present

## 2016-01-20 DIAGNOSIS — F0390 Unspecified dementia without behavioral disturbance: Secondary | ICD-10-CM

## 2016-01-20 DIAGNOSIS — R2681 Unsteadiness on feet: Secondary | ICD-10-CM | POA: Diagnosis not present

## 2016-01-20 DIAGNOSIS — N390 Urinary tract infection, site not specified: Secondary | ICD-10-CM | POA: Diagnosis not present

## 2016-01-20 LAB — CBC & DIFF AND RETIC
BASO%: 0.2 % (ref 0.0–2.0)
Basophils Absolute: 0 10*3/uL (ref 0.0–0.1)
EOS%: 0.9 % (ref 0.0–7.0)
Eosinophils Absolute: 0.1 10*3/uL (ref 0.0–0.5)
HCT: 35.5 % (ref 34.8–46.6)
HGB: 11.3 g/dL — ABNORMAL LOW (ref 11.6–15.9)
Immature Retic Fract: 11.3 % — ABNORMAL HIGH (ref 1.60–10.00)
LYMPH#: 0.8 10*3/uL — AB (ref 0.9–3.3)
LYMPH%: 13.1 % — AB (ref 14.0–49.7)
MCH: 34.8 pg — AB (ref 25.1–34.0)
MCHC: 31.8 g/dL (ref 31.5–36.0)
MCV: 109.2 fL — ABNORMAL HIGH (ref 79.5–101.0)
MONO#: 0.7 10*3/uL (ref 0.1–0.9)
MONO%: 11.5 % (ref 0.0–14.0)
NEUT%: 74.3 % (ref 38.4–76.8)
NEUTROS ABS: 4.8 10*3/uL (ref 1.5–6.5)
PLATELETS: 717 10*3/uL — AB (ref 145–400)
RBC: 3.25 10*6/uL — AB (ref 3.70–5.45)
RDW: 17.3 % — ABNORMAL HIGH (ref 11.2–14.5)
RETIC %: 2.04 % (ref 0.70–2.10)
RETIC CT ABS: 66.3 10*3/uL (ref 33.70–90.70)
WBC: 6.4 10*3/uL (ref 3.9–10.3)
nRBC: 0 % (ref 0–0)

## 2016-01-20 NOTE — Telephone Encounter (Signed)
per pof to sch pt appt-gasve pt copy of avs °

## 2016-01-20 NOTE — Progress Notes (Signed)
Hematology and Oncology Follow Up Visit Date of Visit: 01/20/2016   Shawda Channing [id number] 1923-11-03 82 y.o. GREEN, [name], MD  Chief Complaint: Essential Thrombocytosis.  Principle Diagnosis: Essential thrombocytosis, intitiated on Hydrea on 07/05/2011; JAK2 mutation (-), BCR-ABL(-)   Previous therapy:  1. Initially the patient was on Hydrea 1500 mg QOD and 1000 mg QOD, with significant drop in platelet count to 60,000 and neutropenia, hydrea was decreased, dose uncertain due to pt's dementia and memory issue since 11/07/2015, held on 12/26/2015 2. Anagrelide 0.5 mg twice daily started on 12/26/2015  Current therapy:  Anagrelide 0.5 mg twice daily,  Aspirin 81 mg for thromboprophylaxis.  Interim History:  Ms. Viscarra returns to the office for follow-up. She is accompanied by her son Jimmye Norman to the clinic today. He had a fall and [facility]2016, had laceration, bleeding and bruise on the left side for head and cheek. She was evaluated in the emergency room for that. It's recovering well. She denies any significant pain or other new symptoms, no other episodes of bleeding. Her short-term memory has been pretty poor, she does not remember what happened was the medication she is taking. Her medication has [facility] dispensed by [facility] staff.   Medications: I have reviewed the patient's current medications.  Current Outpatient Prescriptions  Medication Sig Dispense Refill  . acetaminophen (TYLENOL) 325 MG tablet Take 650 mg by mouth every 6 (six) hours as needed.    Marland Kitchen anagrelide (AGRYLIN) 0.5 MG capsule Take 1 capsule (0.5 mg total) by mouth 2 (two) times daily. 60 capsule 0  . aspirin 81 MG tablet Take 81 mg by mouth daily.    Marland Kitchen atenolol (TENORMIN) 50 MG tablet Take 25 mg by mouth 2 (two) times daily.     . calcitonin, salmon, (MIACALCIN/FORTICAL) 200 UNIT/ACT nasal spray USE 1 SPRAY IN ALTERNATING NOSTRILS ONCE A DAY FOR BONES 3.7 mL 11   . flurbiprofen (ANSAID) 100 MG tablet TAKE 1 TABLET BY MOUTH EVERY DAY 90 tablet 2  . latanoprost (XALATAN) 0.005 % ophthalmic solution Place 1 drop into both eyes daily.    . Magnesium 250 MG TABS Take 1 tablet by mouth daily.     Marland Kitchen URELLE (URELLE/URISED) 81 MG TABS Take 1 tablet by mouth daily.     . vitamin E 400 UNIT capsule Take 400 Units by mouth every other day.     No current facility-administered medications for this visit.   Allergies:  Allergies  Allergen Reactions  . Darvocet [Propoxyphene N-Acetaminophen] Nausea And Vomiting  . Darvon Nausea And Vomiting  . Meperidine And Related Nausea And Vomiting  . Vicodin [Hydrocodone-Acetaminophen] Nausea And Vomiting  . Demerol [Meperidine]    Past Medical History, Surgical history, Social history, and Family History were reviewed and updated.  Review of Systems: Constitutional:  Negative for fever, chills, night sweats, anorexia, weight loss, pain. Cardiovascular: no chest pain or dyspnea on exertion Respiratory: no cough, shortness of breath, or wheezing Neurological: no TIA or stroke symptoms, (+) memory loss Dermatological: negative for rash ENT: negative for - epistaxis or headaches Skin: Negative. Gastrointestinal: no abdominal pain, change in bowel habits, or black or bloody stools Genito-Urinary: no dysuria, trouble voiding, or hematuria Hematological and Lymphatic: negative for - bleeding problems or blood clots Breast: negative for breast lumps Musculoskeletal: negative for - gait disturbance Remaining ROS negative.  Physical Exam: Blood pressure 131/60, pulse 69, temperature 97.9 F (36.6 C), temperature source  Oral, resp. rate 18, height 5\' 1"  (1.549 m), weight 91 lb 3.2 oz (41.368 kg), SpO2 95 %. ECOG: 0 General appearance: alert, cooperative, appears stated age and no distress Head: Normocephalic, without obvious abnormality, atraumatic Neck: no adenopathy, supple, symmetrical, trachea midline and thyroid  not enlarged, symmetric, no tenderness/mass/nodules HEENT: OP clear; PERRL; EOMi.  Lymph nodes: Cervical, supraclavicular, and axillary nodes normal. Heart:regular rate and rhythm, S1, S2 normal, no murmur, click, rub or gallop Lung:chest clear, no wheezing, rales, normal symmetric air entry Abdomin: soft, non-tender, without masses or organomegaly EXT:No peripheral edema Skin: (+) Resolving bruises on the left side for head, around left eye and cheek.  Lab Results: CBC Latest Ref Rng 01/20/2016 01/14/2016 01/08/2016  WBC 3.9 - 10.3 10e3/uL 6.4 3.9 4.6  Hemoglobin 11.6 - 15.9 g/dL 11.3(L) 10.7(A) 11.1(L)  Hematocrit 34.8 - 46.6 % 35.5 32(A) 36.1  Platelets 145 - 400 10e3/uL 717(H) 217 193    CMP Latest Ref Rng 01/08/2016 12/23/2015 12/12/2015  Glucose 65 - 99 mg/dL 105(H) 91 158(H)  BUN 6 - 20 mg/dL 21(H) 19.9 26(H)  Creatinine 0.44 - 1.00 mg/dL 1.08(H) 0.9 1.10(H)  Sodium 135 - 145 mmol/L 140 140 143  Potassium 3.5 - 5.1 mmol/L 3.8 4.2 3.6  Chloride 101 - 111 mmol/L 107 - 108  CO2 22 - 32 mmol/L 24 27 23   Calcium 8.9 - 10.3 mg/dL 8.5(L) 8.9 8.8(L)  Total Protein 6.5 - 8.1 g/dL 6.2(L) 6.6 6.4(L)  Total Bilirubin 0.3 - 1.2 mg/dL 0.6 0.62 0.9  Alkaline Phos 38 - 126 U/L 47 66 55  AST 15 - 41 U/L 26 15 19   ALT 14 - 54 U/L 14 10 11(L)   ANC 4.8 today     Impression and Plan:  1. Essential thrombocythemia -- [name] is an 82 year old woman with essential thrombocytosis -lab from her nursing home and ED in the past 3 weeks showed normal plt, however her plt is Elevated at 717K today. [facility], she has been on anagrelide twice daily per the staff. -Continue anagrelide 0.5 mg twice daily for now, and I will repeat her CBC every week. -If her platelet counts remain to be high next week, I'll increase her anagrelide dose -She'll continue aspirin 81 mg daily  2. Dementia -she have [facility] assistant [facility]   -She'll follow-up with her primary care physician Dr. Nyoka Cowden   3. Anorexia  -her sons are concerned about her poor appetite -I recommend her to consider mirtazapine,she will discuss with her primary care physician Dr. Nyoka Cowden  4. She will continue follow up with her PCP for other medical issues.    Plan:  -continue anagrelide 0.5 mg twice daily -[facility], and the result will be faxed over to me -if her plt>500K by next week, I will increase anagrelide to 1 mg in the morning and 0.5 mg in the afternoon  -I'll see her back in 4 weeks -I will copy my note to her PCP Dr. Briscoe Burns, Krista Blue  01/20/2016

## 2016-01-21 DIAGNOSIS — I10 Essential (primary) hypertension: Secondary | ICD-10-CM | POA: Diagnosis not present

## 2016-01-21 LAB — CBC AND DIFFERENTIAL
HCT: 32 % — AB (ref 36–46)
Hemoglobin: 10.4 g/dL — AB (ref 12.0–16.0)
Platelets: 677 10*3/uL — AB (ref 150–399)
WBC: 4.7 10^3/mL

## 2016-01-22 DIAGNOSIS — R319 Hematuria, unspecified: Secondary | ICD-10-CM | POA: Diagnosis not present

## 2016-01-22 DIAGNOSIS — M6281 Muscle weakness (generalized): Secondary | ICD-10-CM | POA: Diagnosis not present

## 2016-01-22 DIAGNOSIS — N390 Urinary tract infection, site not specified: Secondary | ICD-10-CM | POA: Diagnosis not present

## 2016-01-22 DIAGNOSIS — E785 Hyperlipidemia, unspecified: Secondary | ICD-10-CM | POA: Diagnosis not present

## 2016-01-22 DIAGNOSIS — R2681 Unsteadiness on feet: Secondary | ICD-10-CM | POA: Diagnosis not present

## 2016-01-22 DIAGNOSIS — I10 Essential (primary) hypertension: Secondary | ICD-10-CM | POA: Diagnosis not present

## 2016-01-24 DIAGNOSIS — R2681 Unsteadiness on feet: Secondary | ICD-10-CM | POA: Diagnosis not present

## 2016-01-24 DIAGNOSIS — R319 Hematuria, unspecified: Secondary | ICD-10-CM | POA: Diagnosis not present

## 2016-01-24 DIAGNOSIS — N390 Urinary tract infection, site not specified: Secondary | ICD-10-CM | POA: Diagnosis not present

## 2016-01-24 DIAGNOSIS — I10 Essential (primary) hypertension: Secondary | ICD-10-CM | POA: Diagnosis not present

## 2016-01-24 DIAGNOSIS — E785 Hyperlipidemia, unspecified: Secondary | ICD-10-CM | POA: Diagnosis not present

## 2016-01-24 DIAGNOSIS — M6281 Muscle weakness (generalized): Secondary | ICD-10-CM | POA: Diagnosis not present

## 2016-01-27 DIAGNOSIS — E785 Hyperlipidemia, unspecified: Secondary | ICD-10-CM | POA: Diagnosis not present

## 2016-01-27 DIAGNOSIS — M6281 Muscle weakness (generalized): Secondary | ICD-10-CM | POA: Diagnosis not present

## 2016-01-27 DIAGNOSIS — R2681 Unsteadiness on feet: Secondary | ICD-10-CM | POA: Diagnosis not present

## 2016-01-27 DIAGNOSIS — N390 Urinary tract infection, site not specified: Secondary | ICD-10-CM | POA: Diagnosis not present

## 2016-01-27 DIAGNOSIS — I10 Essential (primary) hypertension: Secondary | ICD-10-CM | POA: Diagnosis not present

## 2016-01-27 DIAGNOSIS — R319 Hematuria, unspecified: Secondary | ICD-10-CM | POA: Diagnosis not present

## 2016-01-28 ENCOUNTER — Telehealth: Payer: Self-pay | Admitting: *Deleted

## 2016-01-28 DIAGNOSIS — I10 Essential (primary) hypertension: Secondary | ICD-10-CM | POA: Diagnosis not present

## 2016-01-28 DIAGNOSIS — R319 Hematuria, unspecified: Secondary | ICD-10-CM | POA: Diagnosis not present

## 2016-01-28 DIAGNOSIS — R2681 Unsteadiness on feet: Secondary | ICD-10-CM | POA: Diagnosis not present

## 2016-01-28 DIAGNOSIS — M6281 Muscle weakness (generalized): Secondary | ICD-10-CM | POA: Diagnosis not present

## 2016-01-28 DIAGNOSIS — E785 Hyperlipidemia, unspecified: Secondary | ICD-10-CM | POA: Diagnosis not present

## 2016-01-28 DIAGNOSIS — N390 Urinary tract infection, site not specified: Secondary | ICD-10-CM | POA: Diagnosis not present

## 2016-01-28 LAB — CBC AND DIFFERENTIAL
HEMATOCRIT: 31 % — AB (ref 36–46)
HEMOGLOBIN: 10.3 g/dL — AB (ref 12.0–16.0)
PLATELETS: 718 10*3/uL — AB (ref 150–399)
WBC: 5.7 10*3/mL

## 2016-01-28 NOTE — Telephone Encounter (Signed)
Left two messages on vm regarding change in agrylin dose to 1.0 mg (2 capsules) in the am & 0.5 mg or 1 capsule in pm & requested call back to let us know that message was received.

## 2016-01-29 ENCOUNTER — Telehealth: Payer: Self-pay | Admitting: *Deleted

## 2016-01-29 NOTE — Telephone Encounter (Signed)
Spoke with Helene Kelp, nurse for pt today @ [facility] and informed her of new  Dosage order for Anagrelide by Dr. Burr Medico.  Faxed signed changed orders to [facility]. [facility]   Phone    [phone]  Ext  [phone]  Or  [phone] ;       Fax      [phone].

## 2016-01-30 ENCOUNTER — Non-Acute Institutional Stay: Payer: Medicare Other | Admitting: Nurse Practitioner

## 2016-01-30 DIAGNOSIS — D500 Iron deficiency anemia secondary to blood loss (chronic): Secondary | ICD-10-CM

## 2016-01-30 DIAGNOSIS — F4323 Adjustment disorder with mixed anxiety and depressed mood: Secondary | ICD-10-CM

## 2016-01-30 DIAGNOSIS — I10 Essential (primary) hypertension: Secondary | ICD-10-CM

## 2016-01-30 DIAGNOSIS — F0390 Unspecified dementia without behavioral disturbance: Secondary | ICD-10-CM | POA: Diagnosis not present

## 2016-01-30 DIAGNOSIS — N390 Urinary tract infection, site not specified: Secondary | ICD-10-CM

## 2016-01-30 DIAGNOSIS — R413 Other amnesia: Secondary | ICD-10-CM | POA: Diagnosis not present

## 2016-01-30 DIAGNOSIS — D473 Essential (hemorrhagic) thrombocythemia: Secondary | ICD-10-CM

## 2016-01-30 NOTE — Assessment & Plan Note (Signed)
Baseline Hgb 10-11

## 2016-01-30 NOTE — Assessment & Plan Note (Signed)
12/16/15 MMSE 24/30, 25/30, continue AL for care needs.

## 2016-01-30 NOTE — Assessment & Plan Note (Signed)
Continue observe the patient.

## 2016-01-30 NOTE — Progress Notes (Signed)
Patient ID: [name] [name], female   DOB: [birth year], 82 y.o.   MRN: [id number]  Location:  [facility]   Place of Service: [place] Provider:  Lennie Odor Paolo Okane NP  GREEN, [name], MD  Patient Care Team: Estill Dooms, MD as PCP - General (Internal Medicine) Kingman Makalyn Lennox X, NP as Nurse Practitioner (Nurse Practitioner) Adreonna Yontz Rhunette Croft, NP as Nurse Practitioner (Internal Medicine)  Extended Emergency Contact Information Primary Emergency Contact: [name] Phone: [phone] Relation: Friend  Code Status:  DNR Goals of care: Advanced Directive information Advanced Directives 01/20/2016  Does patient have an advance directive? No  Type of Advance Directive -  Does patient want to make changes to advanced directive? -  Copy of advanced directive(s) in chart? -  Would patient like information on creating an advanced directive? No - patient declined information     Chief Complaint  Patient presents with  . Medical Management of Chronic Issues  . Acute Visit    UTI    HPI:  Pt is a 82 y.o. female seen today for medical management of chronic diseases.  Hx of HTN, controlled on Atenolol 25mg  bid, essential thrombocythemia, takes Agrylin 0.5mg  bid, last plt 217 01/14/16, memory, short terms mostly, resides in AL for care need.    The patient was noted to have low grade T, urinary frequency, pacing, 01/30/16 urine culture was positive for UTI, K. Pneumoniae, susceptible to Nitrofurantoin, last treated UTI was 12/2015 with Nitrofurantoin. Denied nausea, vomiting, abd pain, or chest discomfort.     Past Medical History  Diagnosis Date  . Glaucoma   . Hypertension   . Arthritis   . Neck fracture ([place])   . Abdominal pain, unspecified site 08/11/2012  . Essential thrombocythemia ([place]) 06/18/2011  . Anemia, unspecified 06/18/2011  . 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 Loss of weight 06/18/2011  . Lumbago 12/18/2010  . Sciatica  06/19/2010  . Memory loss 12/19/2009  . Diverticulosis of colon (without mention of hemorrhage) 04/20/2007  . Disorder of bone and cartilage, unspecified 04/20/2007  . Hemorrhage of gastrointestinal tract, unspecified 03/09/2007  . Other and unspecified hyperlipidemia 09/01/2006  . BBB (bundle branch block) 03/03/2006  . Cardiac dysrhythmia, unspecified 03/03/2006  . Chronic interstitial cystitis 03/03/2006  . Osteoarthrosis, unspecified whether generalized or localized, unspecified site 03/03/2006  . Hematuria, unspecified 04/22/2004  . Herpes zoster without mention of complication [id number]  . 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 Appendectomy    . Tonsillectomy    . Abdominal hysterectomy    . Cataract extraction w/ intraocular lens  implant, bilateral    . Breast cyst excision Bilateral     benign    Allergies  Allergen Reactions  . Darvocet [Propoxyphene N-Acetaminophen] Nausea And Vomiting  . Darvon Nausea And Vomiting  . Meperidine And Related Nausea And Vomiting  . Vicodin [Hydrocodone-Acetaminophen] Nausea And Vomiting  . Demerol [Meperidine]       Medication List       This list is accurate as of: 01/30/16 11:45 AM.  Always use your most recent med list.               acetaminophen 325 MG tablet  Commonly known as:  TYLENOL  Take 650 mg by mouth every 6 (six) hours as needed.     anagrelide 0.5 MG capsule  Commonly known as:  AGRYLIN  Take 1 capsule (0.5 mg  total) by mouth 2 (two) times daily.     aspirin 81 MG tablet  Take 81 mg by mouth daily.     atenolol 50 MG tablet  Commonly known as:  TENORMIN  Take 25 mg by mouth 2 (two) times daily.     calcitonin (salmon) 200 UNIT/ACT nasal spray  Commonly known as:  MIACALCIN/FORTICAL  USE 1 SPRAY IN ALTERNATING NOSTRILS ONCE A DAY FOR BONES     flurbiprofen 100 MG tablet  Commonly known as:  ANSAID  TAKE 1 TABLET BY MOUTH EVERY DAY     latanoprost 0.005 %  ophthalmic solution  Commonly known as:  XALATAN  Place 1 drop into both eyes daily.     Magnesium 250 MG Tabs  Take 1 tablet by mouth daily.     URELLE 81 MG Tabs tablet  Take 1 tablet by mouth daily.     vitamin E 400 UNIT capsule  Take 400 Units by mouth every other day.        Review of Systems  Constitutional: Negative for fever and diaphoresis.  Eyes: Negative.   Respiratory: Negative.   Cardiovascular: Negative.   Gastrointestinal: Negative.   Genitourinary: Negative.   Musculoskeletal: Negative.   Skin: Positive for wound.       Left forehead traumatic laceration, sutures intact, no s/s of infection.   Neurological:       Memory loss  Hematological:       Thrombocythemia  Psychiatric/Behavioral: Negative.     Immunization History  Administered Date(s) Administered  . Influenza Whole 08/09/2012, 07/10/2013  . Influenza-Unspecified 08/14/2014, 08/08/2015  . Pneumococcal Polysaccharide-23 11/09/2004  . Zoster 10/07/2006   Pertinent  Health Maintenance Due  Topic Date Due  . PNA vac Low Risk Adult (2 of 2 - PCV13) 11/09/2005  . INFLUENZA VACCINE  06/09/2016  . DEXA SCAN  Completed   Fall Risk  12/19/2015 08/22/2015 02/21/2015 09/07/2014 03/22/2014  Falls in the past year? No No No No No   Functional Status Survey:    Filed Vitals:   01/30/16 1130  BP: 102/60  Pulse: 69  Temp: 99.5 F (37.5 C)  TempSrc: Tympanic  Resp: 18   There is no weight on file to calculate BMI. Physical Exam  Constitutional: She is oriented to person, place, and time. She appears well-developed and well-nourished. No distress.  HENT:  Head: Normocephalic and atraumatic.  Right Ear: External ear normal.  Left Ear: External ear normal.  Nose: Nose normal.  Mouth/Throat: Oropharynx is clear and moist.  Eyes: Conjunctivae and EOM are normal. Pupils are equal, round, and reactive to light.  Corrective lenses  Neck: Neck supple. No JVD present. No tracheal deviation present. No  thyromegaly present.  Cardiovascular: Normal rate and regular rhythm.  Exam reveals no gallop and no friction rub.   Murmur (2/6/LSB SEM) heard. Pulmonary/Chest: No respiratory distress. She has no wheezes. She has no rales.  Abdominal: She exhibits no distension and no mass. There is no tenderness.  Musculoskeletal: She exhibits no edema or tenderness.  Heberden's nodes. Bouchard's nodes. Contracture of the right hand.  Lymphadenopathy:    She has no cervical adenopathy.  Neurological: She is alert and oriented to person, place, and time. She has normal reflexes. No cranial nerve deficit. Coordination normal.  08/22/2015 MMSE 28/30. Failed clock drawing.  Skin: No rash noted. No erythema. No pallor.  Left forehead traumatic laceration, sutures intact, no s/s of infection.   Psychiatric: She has a normal mood and affect.  Her behavior is normal. Judgment and thought content normal.    Labs reviewed:  Recent Labs  12/12/15 1732 12/23/15 0917 01/08/16 0917  NA 143 140 140  K 3.6 4.2 3.8  CL 108  --  107  CO2 23 27 24   GLUCOSE 158* 91 105*  BUN 26* 19.9 21*  CREATININE 1.10* 0.9 1.08*  CALCIUM 8.8* 8.9 8.5*    Recent Labs  12/12/15 1732 12/23/15 0917 01/08/16 0917  AST 19 15 26   ALT 11* 10 14  ALKPHOS 55 66 47  BILITOT 0.9 0.62 0.6  PROT 6.4* 6.6 6.2*  ALBUMIN 3.4* 3.1* 3.4*    Recent Labs  12/23/15 0917 01/08/16 0917 01/14/16 01/20/16 0954  WBC 5.1 4.6 3.9 6.4  NEUTROABS 3.9 3.5  --  4.8  HGB 11.0* 11.1* 10.7* 11.3*  HCT 34.7* 36.1 32* 35.5  MCV 117.6* 117.2*  --  109.2*  PLT 832* 193 217 717*   No results found for: TSH No results found for: HGBA1C Lab Results  Component Value Date   CHOL 178 02/12/2015   HDL 77* 02/12/2015   LDLCALC 83 02/12/2015   TRIG 92 02/12/2015    Significant Diagnostic Results in last 30 days:  Ct Head Wo Contrast  01/08/2016  CLINICAL DATA:  Memory loss. Fall earlier today. Question transient loss of consciousness EXAM: CT  HEAD WITHOUT CONTRAST CT CERVICAL SPINE WITHOUT CONTRAST TECHNIQUE: Multidetector CT imaging of the head and cervical spine was performed following the standard protocol without intravenous contrast. Multiplanar CT image reconstructions of the cervical spine were also generated. COMPARISON:  None. FINDINGS: CT HEAD FINDINGS There is moderate diffuse atrophy. There is no intracranial mass, hemorrhage, extra-axial fluid collection, or midline shift. There is slight small vessel disease in the centra semiovale bilaterally. Elsewhere, gray-white compartments appear normal. No acute infarct evident. There is soft tissue swelling and air lateral to the superior, lateral left orbital rim. The bony calvarium in the orbital rim regions appear intact. Mastoid air cells are clear. No intraorbital lesions are apparent. There is patchy opacity in several ethmoid air cells bilaterally. CT CERVICAL SPINE FINDINGS There is no demonstrable fracture. There is slight anterolisthesis of C7 on T1. There is no other spondylolisthesis. Prevertebral soft tissues and predental space regions are normal. There is marked disc space narrowing at all levels except for C7-T1. There is mild disc space narrowing at C7-T1. There is apparent ankylosis at C4-5. There is facet hypertrophy at essentially all levels bilaterally. There is no disc extrusion or high-grade stenosis. There is calcification in the both carotid arteries. There is symmetric apical pleural thickening bilaterally with mild calcification along the apical pleural surfaces bilaterally. There are benign-appearing calcifications in each lobe of the thyroid. 11 IMPRESSION: CT head: Atrophy with mild periventricular small vessel disease. No intracranial mass, hemorrhage, or extra-axial fluid collection. No acute infarct evident. Soft tissue injury with swelling and air lateral to the superior, lateral aspect of the left orbit. No fracture evident. There is patchy ethmoid sinus disease  bilaterally. CT cervical spine: Extensive arthropathy. Slight spondylolisthesis at C7-T1 is felt to be due to underlying spondylosis. No other spondylolisthesis. No acute fracture evident. Calcification is noted in each carotid artery. Electronically Signed   By: Lowella Grip III M.D.   On: 01/08/2016 10:23   Ct Cervical Spine Wo Contrast  01/08/2016  CLINICAL DATA:  Memory loss. Fall earlier today. Question transient loss of consciousness EXAM: CT HEAD WITHOUT CONTRAST CT CERVICAL SPINE WITHOUT CONTRAST TECHNIQUE: Multidetector  CT imaging of the head and cervical spine was performed following the standard protocol without intravenous contrast. Multiplanar CT image reconstructions of the cervical spine were also generated. COMPARISON:  None. FINDINGS: CT HEAD FINDINGS There is moderate diffuse atrophy. There is no intracranial mass, hemorrhage, extra-axial fluid collection, or midline shift. There is slight small vessel disease in the centra semiovale bilaterally. Elsewhere, gray-white compartments appear normal. No acute infarct evident. There is soft tissue swelling and air lateral to the superior, lateral left orbital rim. The bony calvarium in the orbital rim regions appear intact. Mastoid air cells are clear. No intraorbital lesions are apparent. There is patchy opacity in several ethmoid air cells bilaterally. CT CERVICAL SPINE FINDINGS There is no demonstrable fracture. There is slight anterolisthesis of C7 on T1. There is no other spondylolisthesis. Prevertebral soft tissues and predental space regions are normal. There is marked disc space narrowing at all levels except for C7-T1. There is mild disc space narrowing at C7-T1. There is apparent ankylosis at C4-5. There is facet hypertrophy at essentially all levels bilaterally. There is no disc extrusion or high-grade stenosis. There is calcification in the both carotid arteries. There is symmetric apical pleural thickening bilaterally with mild  calcification along the apical pleural surfaces bilaterally. There are benign-appearing calcifications in each lobe of the thyroid. 11 IMPRESSION: CT head: Atrophy with mild periventricular small vessel disease. No intracranial mass, hemorrhage, or extra-axial fluid collection. No acute infarct evident. Soft tissue injury with swelling and air lateral to the superior, lateral aspect of the left orbit. No fracture evident. There is patchy ethmoid sinus disease bilaterally. CT cervical spine: Extensive arthropathy. Slight spondylolisthesis at C7-T1 is felt to be due to underlying spondylosis. No other spondylolisthesis. No acute fracture evident. Calcification is noted in each carotid artery. Electronically Signed   By: Lowella Grip III M.D.   On: 01/08/2016 10:23    Assessment/Plan  Adjustment disorder with mixed anxiety and depressed mood Continue observe the patient.   Anemia Baseline Hgb 10-11  Dementia 12/16/15 MMSE 24/30, 25/30, continue AL for care needs.   Essential thrombocythemia ([place]) Plt 500s while in ED 12/12/15,f/u plt 01/07/16 262, 01/14/16 plt 215, continue Hydroxyurea 500mg  II M-F, I sat and sun. F/u oncology.   Hypertension Controlled, continue Atenolol 25mg  bid.   Memory loss 12/16/15 MMSE 24/30, 25/30, continue AL for care needs. Supervision for safety  UTI (urinary tract infection) ED eval 12/12/15 for T 104, she was identified for UTI, Nitrofurantoin was completed 12/20/15  01/30/16 urine culture E. Coli, Nitrofurantoin 100mg  bid x 10 days started     Family/ staff Communication: continue AL for care needs.   Labs/tests ordered:  Urine culture done 01/30/16

## 2016-01-30 NOTE — Assessment & Plan Note (Signed)
Controlled, continue Atenolol 25mg bid.  

## 2016-01-30 NOTE — Assessment & Plan Note (Signed)
ED eval 12/12/15 for T 104, she was identified for UTI, Nitrofurantoin was completed 12/20/15  01/30/16 urine culture E. Coli, Nitrofurantoin 100mg  bid x 10 days started

## 2016-01-30 NOTE — Assessment & Plan Note (Signed)
12/16/15 MMSE 24/30, 25/30, continue AL for care needs. Supervision for safety

## 2016-01-30 NOTE — Assessment & Plan Note (Signed)
Plt 500s while in ED 12/12/15,f/u plt 01/07/16 262, 01/14/16 plt 215, continue Hydroxyurea 500mg  II M-F, I sat and sun. F/u oncology.

## 2016-01-31 DIAGNOSIS — M6281 Muscle weakness (generalized): Secondary | ICD-10-CM | POA: Diagnosis not present

## 2016-01-31 DIAGNOSIS — E785 Hyperlipidemia, unspecified: Secondary | ICD-10-CM | POA: Diagnosis not present

## 2016-01-31 DIAGNOSIS — R2681 Unsteadiness on feet: Secondary | ICD-10-CM | POA: Diagnosis not present

## 2016-01-31 DIAGNOSIS — N390 Urinary tract infection, site not specified: Secondary | ICD-10-CM | POA: Diagnosis not present

## 2016-01-31 DIAGNOSIS — R319 Hematuria, unspecified: Secondary | ICD-10-CM | POA: Diagnosis not present

## 2016-01-31 DIAGNOSIS — I10 Essential (primary) hypertension: Secondary | ICD-10-CM | POA: Diagnosis not present

## 2016-02-03 DIAGNOSIS — E785 Hyperlipidemia, unspecified: Secondary | ICD-10-CM | POA: Diagnosis not present

## 2016-02-03 DIAGNOSIS — I10 Essential (primary) hypertension: Secondary | ICD-10-CM | POA: Diagnosis not present

## 2016-02-03 DIAGNOSIS — M6281 Muscle weakness (generalized): Secondary | ICD-10-CM | POA: Diagnosis not present

## 2016-02-03 DIAGNOSIS — N390 Urinary tract infection, site not specified: Secondary | ICD-10-CM | POA: Diagnosis not present

## 2016-02-03 DIAGNOSIS — R319 Hematuria, unspecified: Secondary | ICD-10-CM | POA: Diagnosis not present

## 2016-02-03 DIAGNOSIS — R2681 Unsteadiness on feet: Secondary | ICD-10-CM | POA: Diagnosis not present

## 2016-02-04 ENCOUNTER — Telehealth: Payer: Self-pay | Admitting: *Deleted

## 2016-02-04 ENCOUNTER — Encounter: Payer: Self-pay | Admitting: Hematology

## 2016-02-04 DIAGNOSIS — I10 Essential (primary) hypertension: Secondary | ICD-10-CM | POA: Diagnosis not present

## 2016-02-04 NOTE — Telephone Encounter (Signed)
Received faxed lab results done today from [facility].  Dr. Burr Medico reviewed results.  Faxed instructions on dosage change of Anagrelide to Baruch Gouty, RN  Re: 1.    Increase Anagrelide to 1.0 mg ( 2 tabs )  Twice  Daily. 2.    Repeat  CBC  Every week until next office visit with Dr. Burr Medico.

## 2016-02-11 ENCOUNTER — Encounter: Payer: Self-pay | Admitting: *Deleted

## 2016-02-11 ENCOUNTER — Telehealth: Payer: Self-pay | Admitting: *Deleted

## 2016-02-11 DIAGNOSIS — I10 Essential (primary) hypertension: Secondary | ICD-10-CM | POA: Diagnosis not present

## 2016-02-11 LAB — CBC AND DIFFERENTIAL
HCT: 33 % — AB (ref 36–46)
Hemoglobin: 10.6 g/dL — AB (ref 12.0–16.0)
Platelets: 1146 10*3/uL — AB (ref 150–399)
WBC: 5.6 10^3/mL

## 2016-02-11 NOTE — Telephone Encounter (Signed)
Called & spoke to [name] Norfolk LPN at [facility] to see what dose pt is taking of anagrelide.  Izora Gala reported pt is taking 0.5 mg (1 tab bid).  Dose was supposed to be increased to 2 tabs (1mg ) bid on 02/04/16.  She will correct dose & make sure pt takes meds as ordered.  Pt to return to see Dr Burr Medico next week.   Izora Gala returned call after investigating meds & reports pt was increased to 2 tabs = 1mg  bid on 02/05/16.  Instructions given to increase dose to 1/5 mg = 3 tabs bid.  Orders written & faxed to [facility] @ [phone].

## 2016-02-17 ENCOUNTER — Telehealth: Payer: Self-pay | Admitting: Hematology

## 2016-02-17 ENCOUNTER — Telehealth: Payer: Self-pay

## 2016-02-17 ENCOUNTER — Other Ambulatory Visit (HOSPITAL_BASED_OUTPATIENT_CLINIC_OR_DEPARTMENT_OTHER): Payer: Medicare Other

## 2016-02-17 ENCOUNTER — Encounter: Payer: Self-pay | Admitting: Hematology

## 2016-02-17 ENCOUNTER — Ambulatory Visit (HOSPITAL_BASED_OUTPATIENT_CLINIC_OR_DEPARTMENT_OTHER): Payer: Medicare Other | Admitting: Hematology

## 2016-02-17 ENCOUNTER — Other Ambulatory Visit: Payer: Self-pay | Admitting: *Deleted

## 2016-02-17 VITALS — BP 124/63 | HR 63 | Temp 97.4°F | Resp 17 | Wt 95.1 lb

## 2016-02-17 DIAGNOSIS — D473 Essential (hemorrhagic) thrombocythemia: Secondary | ICD-10-CM

## 2016-02-17 DIAGNOSIS — D649 Anemia, unspecified: Secondary | ICD-10-CM | POA: Diagnosis not present

## 2016-02-17 DIAGNOSIS — F0390 Unspecified dementia without behavioral disturbance: Secondary | ICD-10-CM | POA: Diagnosis not present

## 2016-02-17 DIAGNOSIS — R630 Anorexia: Secondary | ICD-10-CM

## 2016-02-17 DIAGNOSIS — R634 Abnormal weight loss: Secondary | ICD-10-CM

## 2016-02-17 LAB — CBC & DIFF AND RETIC
BASO%: 0.4 % (ref 0.0–2.0)
BASOS ABS: 0 10*3/uL (ref 0.0–0.1)
EOS ABS: 0.2 10*3/uL (ref 0.0–0.5)
EOS%: 2 % (ref 0.0–7.0)
HEMATOCRIT: 35.7 % (ref 34.8–46.6)
HGB: 11.1 g/dL — ABNORMAL LOW (ref 11.6–15.9)
Immature Retic Fract: 13.7 % — ABNORMAL HIGH (ref 1.60–10.00)
LYMPH%: 14.2 % (ref 14.0–49.7)
MCH: 32.2 pg (ref 25.1–34.0)
MCHC: 31.1 g/dL — AB (ref 31.5–36.0)
MCV: 103.5 fL — ABNORMAL HIGH (ref 79.5–101.0)
MONO#: 0.9 10*3/uL (ref 0.1–0.9)
MONO%: 11.8 % (ref 0.0–14.0)
NEUT#: 5.5 10*3/uL (ref 1.5–6.5)
NEUT%: 71.6 % (ref 38.4–76.8)
PLATELETS: 981 10*3/uL — AB (ref 145–400)
RBC: 3.45 10*6/uL — ABNORMAL LOW (ref 3.70–5.45)
RDW: 17.5 % — AB (ref 11.2–14.5)
RETIC CT ABS: 50.37 10*3/uL (ref 33.70–90.70)
Retic %: 1.46 % (ref 0.70–2.10)
WBC: 7.6 10*3/uL (ref 3.9–10.3)
lymph#: 1.1 10*3/uL (ref 0.9–3.3)
nRBC: 0 % (ref 0–0)

## 2016-02-17 MED ORDER — ANAGRELIDE HCL 1 MG PO CAPS: 1.0000 mg | ORAL_CAPSULE | Freq: Two times a day (BID) | ORAL | Status: DC

## 2016-02-17 MED ORDER — ANAGRELIDE HCL 1 MG PO CAPS: 2.0000 mg | ORAL_CAPSULE | Freq: Two times a day (BID) | ORAL | Status: DC

## 2016-02-17 NOTE — Addendum Note (Signed)
Addended by: Elray Buba LE on: 02/17/2016 05:53 PM   Modules accepted: Orders, Medications

## 2016-02-17 NOTE — Progress Notes (Signed)
Hematology and Oncology Follow Up Visit Date of Visit: 02/17/2016   [name] [name] [id number] 1923-01-28 82 y.o. [name] [name] Spare, MD  Chief Complaint: Essential Thrombocytosis.  Principle Diagnosis: Essential thrombocytosis, intitiated on Hydrea on 07/05/2011; JAK2 mutation (-), BCR-ABL(-)   Previous therapy:  1. Initially the patient was on Hydrea 1500 mg QOD and 1000 mg QOD, with significant drop in platelet count to 60,000 and neutropenia, hydrea was decreased, dose uncertain due to pt's dementia and memory issue since 11/07/2015, held on 12/26/2015 2. Anagrelide 0.5 mg twice daily started on 12/26/2015, dose increased to 2mg  bid on 02/17/2016  Current therapy:  Anagrelide 1.5 mg twice daily,  Aspirin 81 mg for thromboprophylaxis.  Interim History:  [name] [name] returns to the office for follow-up. She is accompanied by her grandson and his wife to the clinic today. He is doing well lately, denies any new symptoms. Her bruise on head from previous fall has resolved. [facility], the result has been faxed to me, I have gradually increased her dose of anagrelide. She is tolerating it well, no side effects.  Medications: I have reviewed the patient's current medications.  Current Outpatient Prescriptions  Medication Sig Dispense Refill  . anagrelide (AGRYLIN) 1 MG capsule Take 1 capsule (1 mg total) by mouth 2 (two) times daily. (Patient taking differently: Take 2 mg by mouth 2 (two) times daily. Starting 02/17/16 after office visit -  Pt has new script to take back to [facility].) 60 capsule 1  . acetaminophen (TYLENOL) 325 MG tablet Take 650 mg by mouth every 6 (six) hours as needed.    Marland Kitchen aspirin 81 MG tablet Take 81 mg by mouth daily.    Marland Kitchen atenolol (TENORMIN) 50 MG tablet Take 25 mg by mouth 2 (two) times daily.     . calcitonin, salmon, (MIACALCIN/FORTICAL) 200 UNIT/ACT nasal spray USE 1 SPRAY IN ALTERNATING NOSTRILS ONCE A DAY FOR BONES 3.7 mL 11  .  flurbiprofen (ANSAID) 100 MG tablet TAKE 1 TABLET BY MOUTH EVERY DAY 90 tablet 2  . latanoprost (XALATAN) 0.005 % ophthalmic solution Place 1 drop into both eyes daily.    . Magnesium 250 MG TABS Take 1 tablet by mouth daily.     Marland Kitchen URELLE (URELLE/URISED) 81 MG TABS Take 1 tablet by mouth daily.     . vitamin E 400 UNIT capsule Take 400 Units by mouth every other day.     No current facility-administered medications for this visit.   Allergies:  Allergies  Allergen Reactions  . Darvocet [Propoxyphene N-Acetaminophen] Nausea And Vomiting  . Darvon Nausea And Vomiting  . Meperidine And Related Nausea And Vomiting  . Vicodin [Hydrocodone-Acetaminophen] Nausea And Vomiting  . Demerol [Meperidine]    Past Medical History, Surgical history, Social history, and Family History were reviewed and updated.  Review of Systems: Constitutional:  Negative for fever, chills, night sweats, anorexia, weight loss, pain. Cardiovascular: no chest pain or dyspnea on exertion Respiratory: no cough, shortness of breath, or wheezing Neurological: no TIA or stroke symptoms, (+) memory loss Dermatological: negative for rash ENT: negative for - epistaxis or headaches Skin: Negative. Gastrointestinal: no abdominal pain, change in bowel habits, or black or bloody stools Genito-Urinary: no dysuria, trouble voiding, or hematuria Hematological and Lymphatic: negative for - bleeding problems or blood clots Breast: negative for breast lumps Musculoskeletal: negative for - gait disturbance Remaining ROS negative.  Physical Exam: Blood pressure 124/63, pulse 63, temperature 97.4 F (36.3 C),  temperature source Oral, resp. rate 17, weight 95 lb 1.6 oz (43.137 kg), SpO2 99 %. ECOG: 0 General appearance: alert, cooperative, appears stated age and no distress Head: Normocephalic, without obvious abnormality, atraumatic Neck: no adenopathy, supple, symmetrical, trachea midline and thyroid not enlarged, symmetric, no  tenderness/mass/nodules HEENT: OP clear; PERRL; EOMi.  Lymph nodes: Cervical, supraclavicular, and axillary nodes normal. Heart:regular rate and rhythm, S1, S2 normal, no murmur, click, rub or gallop Lung:chest clear, no wheezing, rales, normal symmetric air entry Abdomin: soft, non-tender, without masses or organomegaly EXT:No peripheral edema Skin: No bruises or skin rashes  Lab Results: CBC Latest Ref Rng 02/17/2016 02/11/2016 01/28/2016  WBC 3.9 - 10.3 10e3/uL 7.6 5.6 5.7  Hemoglobin 11.6 - 15.9 g/dL 11.1(L) 10.6(A) 10.3(A)  Hematocrit 34.8 - 46.6 % 35.7 33(A) 31(A)  Platelets 145 - 400 10e3/uL 981(H) 1146(A) 718(A)    CMP Latest Ref Rng 01/08/2016 12/23/2015 12/12/2015  Glucose 65 - 99 mg/dL 105(H) 91 158(H)  BUN 6 - 20 mg/dL 21(H) 19.9 26(H)  Creatinine 0.44 - 1.00 mg/dL 1.08(H) 0.9 1.10(H)  Sodium 135 - 145 mmol/L 140 140 143  Potassium 3.5 - 5.1 mmol/L 3.8 4.2 3.6  Chloride 101 - 111 mmol/L 107 - 108  CO2 22 - 32 mmol/L 24 27 23   Calcium 8.9 - 10.3 mg/dL 8.5(L) 8.9 8.8(L)  Total Protein 6.5 - 8.1 g/dL 6.2(L) 6.6 6.4(L)  Total Bilirubin 0.3 - 1.2 mg/dL 0.6 0.62 0.9  Alkaline Phos 38 - 126 U/L 47 66 55  AST 15 - 41 U/L 26 15 19   ALT 14 - 54 U/L 14 10 11(L)   ANC 4.8 today     Impression and Plan: 82 year old Caucasian female  1. Essential thrombocythemia -We reviewed the natural course of essential thrombocythemia with patient and her family members, and the risk of major combination of thrombosis. -lab from her nursing home in the past 3 weeks showed persistent elevated platelet count, around to 1100K, I have gradually increased her anagrelide dose to 1.5 mg twice daily last week  -Her platelet counts 981,000 today, I'll increase her anagrelide to 2 mg twice daily - [facility]. -If her platelet counts remain to be high next week, I'll increase her anagrelide dose 1mg  every 1-2 weeks  -She'll continue aspirin 81 mg daily  2. Dementia -she  have [facility] assistant living [facility]  -She'll follow-up with her primary care physician Dr. Nyoka Cowden   3. Anorexia and weight loss -She has been taking some protein shake, gained some weight lately. -I encouraged her to continue nutrition supplement.  4. Anemia -secondary to hydrea  -will continue monitoring, no need for blood transfusion  5. She will continue follow up with her PCP for other medical issues.    Plan:  -increase anagrelide to 2mg  twice daily, new prescription and my recommendation on the consult sheet was sent back to her facility [place] -[facility], and the result will be faxed over to me -if her plt>500K by next week, I will increase anagrelide to 1 mg every 1-2 weeks -I'll see her back in 4 weeks -I will copy my note to her PCP Dr. Briscoe Burns, Krista Blue  02/17/2016

## 2016-02-17 NOTE — Telephone Encounter (Signed)
per pof to sch pt appt-gave pt copy of avs °

## 2016-02-17 NOTE — Telephone Encounter (Signed)
Clarene Critchley from [facility] called regarding patient's medication, Agrylin.  There was a question about the dose and the prescription.  Thu, RN to call Clarene Critchley back and fax a new prescription.

## 2016-02-18 DIAGNOSIS — I10 Essential (primary) hypertension: Secondary | ICD-10-CM | POA: Diagnosis not present

## 2016-02-20 ENCOUNTER — Non-Acute Institutional Stay: Payer: Medicare Other | Admitting: Internal Medicine

## 2016-02-20 VITALS — BP 118/64 | HR 71 | Temp 98.0°F | Resp 18 | Ht 61.0 in | Wt 96.8 lb

## 2016-02-20 DIAGNOSIS — D473 Essential (hemorrhagic) thrombocythemia: Secondary | ICD-10-CM

## 2016-02-20 DIAGNOSIS — D649 Anemia, unspecified: Secondary | ICD-10-CM

## 2016-02-20 DIAGNOSIS — R413 Other amnesia: Secondary | ICD-10-CM

## 2016-02-20 DIAGNOSIS — F0390 Unspecified dementia without behavioral disturbance: Secondary | ICD-10-CM | POA: Diagnosis not present

## 2016-02-20 MED ORDER — DONEPEZIL HCL 5 MG PO TABS: ORAL_TABLET | ORAL | Status: DC

## 2016-02-20 NOTE — Progress Notes (Signed)
Patient ID: [name] [name], female   DOB: 1922/11/23, 82 y.o.   MRN: [id number]    [place] Room Number: 545  Place of Service: Clinic (12)     Allergies  Allergen Reactions  . Darvocet [Propoxyphene N-Acetaminophen] Nausea And Vomiting  . Darvon Nausea And Vomiting  . Meperidine And Related Nausea And Vomiting  . Vicodin [Hydrocodone-Acetaminophen] Nausea And Vomiting  . Demerol [Meperidine]     Chief Complaint  Patient presents with  . Medical Management of Chronic Issues    MMSE done    HPI:  Memory loss - continues to decliine  Essential thrombocythemia ([place]) - Continues to have problems with mild anemia and thrombocythemia. Recently changed to anagrelide 1 mg twice a day. Since we tolerating this  Anemia, unspecified anemia type - unchanged   Medications: Patient's Medications  New Prescriptions   No medications on file  Previous Medications   ANAGRELIDE (AGRYLIN) 1 MG CAPSULE    Take 1 mg by mouth 2 (two) times daily.   ASPIRIN 81 MG TABLET    Take 81 mg by mouth daily.   ATENOLOL (TENORMIN) 50 MG TABLET    Take 25 mg by mouth 2 (two) times daily.    CALCITONIN, SALMON, (MIACALCIN/FORTICAL) 200 UNIT/ACT NASAL SPRAY    USE 1 SPRAY IN ALTERNATING NOSTRILS ONCE A DAY FOR BONES   DORZOLAMIDE-TIMOLOL (COSOPT) 22.3-6.8 MG/ML OPHTHALMIC SOLUTION    Place 1 drop into both eyes 2 (two) times daily. 2% - 0.5%.   FLURBIPROFEN (ANSAID) 100 MG TABLET    TAKE 1 TABLET BY MOUTH EVERY DAY   LATANOPROST (XALATAN) 0.005 % OPHTHALMIC SOLUTION    Place 1 drop into both eyes daily.   MAGNESIUM 250 MG TABS    Take 1 tablet by mouth daily.    URELLE (URELLE/URISED) 81 MG TABS    Take 1 tablet by mouth at bedtime as needed.    VITAMIN E 400 UNIT CAPSULE    Take 400 Units by mouth daily.   Modified Medications   No medications on file  Discontinued Medications   ANAGRELIDE (AGRYLIN) 1 MG CAPSULE    Take 2 capsules (2 mg total) by mouth 2 (two) times  daily. Starting 02/17/16 after office visit -  Pt has new script to take back to [facility].     Review of Systems  Constitutional: Negative for fever, diaphoresis, activity change, appetite change, fatigue and unexpected weight change.  HENT: Positive for voice change (hoarse since September 2014).   Eyes: Negative.   Respiratory: Negative.   Cardiovascular: Negative.   Gastrointestinal: Negative.   Endocrine: Negative.   Genitourinary: Negative.   Musculoskeletal: Negative.   Neurological:       Memory loss  Hematological:       Thrombocythemia  Psychiatric/Behavioral: Negative.     Filed Vitals:   02/20/16 1407  BP: 118/64  Pulse: 71  Temp: 98 F (36.7 C)  TempSrc: Oral  Resp: 18  Height: '5\' 1"'  (1.549 m)  Weight: 96 lb 12.8 oz (43.908 kg)  SpO2: 98%   Wt Readings from Last 3 Encounters:  02/20/16 96 lb 12.8 oz (43.908 kg)  02/17/16 95 lb 1.6 oz (43.137 kg)  01/20/16 91 lb 3.2 oz (41.368 kg)    Body mass index is 18.3 kg/(m^2).  Physical Exam  Constitutional: She is oriented to person, place, and time. She appears well-developed and well-nourished. No distress.  HENT:  Head: Normocephalic and atraumatic.  Right Ear: External ear  normal.  Left Ear: External ear normal.  Nose: Nose normal.  Mouth/Throat: Oropharynx is clear and moist.  Eyes: Conjunctivae and EOM are normal. Pupils are equal, round, and reactive to light.  Corrective lenses  Neck: Neck supple. No JVD present. No tracheal deviation present. No thyromegaly present.  Cardiovascular: Normal rate and regular rhythm.  Exam reveals no gallop and no friction rub.   Murmur (2/6/LSB SEM) heard. Pulmonary/Chest: No respiratory distress. She has no wheezes. She has no rales.  Abdominal: She exhibits no distension and no mass. There is no tenderness.  Musculoskeletal: She exhibits no edema or tenderness.  Heberden's nodes. Bouchard's nodes. Contracture of the right hand.  Lymphadenopathy:    She has no  cervical adenopathy.  Neurological: She is alert and oriented to person, place, and time. She has normal reflexes. No cranial nerve deficit. Coordination normal.  08/22/15 MMSE 28/30. Failed clock drawing. 02/20/16   MMSE 24/30.. Failed clock drawing.  Skin: No rash noted. No erythema. No pallor.  Healed Left forehead traumatic laceration.  Psychiatric: She has a normal mood and affect. Her behavior is normal. Judgment and thought content normal.     Labs reviewed: Lab Summary Latest Ref Rng 02/17/2016 02/11/2016 01/28/2016 01/21/2016 01/20/2016 01/14/2016 01/08/2016  Hemoglobin 11.6 - 15.9 g/dL 11.1(L) 10.6(A) 10.3(A) 10.4(A) 11.3(L) 10.7(A) 11.1(L)  Hematocrit 34.8 - 46.6 % 35.7 33(A) 31(A) 32(A) 35.5 32(A) 36.1  White count 3.9 - 10.3 10e3/uL 7.6 5.6 5.7 4.7 6.4 3.9 4.6  Platelet count 145 - 400 10e3/uL 981(H) 1146(A) 718(A) 677(A) 717(H) 217 193  Sodium 135 - 145 mmol/L (None) (None) (None) (None) (None) (None) 140  Potassium 3.5 - 5.1 mmol/L (None) (None) (None) (None) (None) (None) 3.8  Calcium 8.9 - 10.3 mg/dL (None) (None) (None) (None) (None) (None) 8.5(L)  Phosphorus - (None) (None) (None) (None) (None) (None) (None)  Creatinine 0.44 - 1.00 mg/dL (None) (None) (None) (None) (None) (None) 1.08(H)  AST 15 - 41 U/L (None) (None) (None) (None) (None) (None) 26  Alk Phos 38 - 126 U/L (None) (None) (None) (None) (None) (None) 47  Bilirubin 0.3 - 1.2 mg/dL (None) (None) (None) (None) (None) (None) 0.6  Glucose 65 - 99 mg/dL (None) (None) (None) (None) (None) (None) 105(H)  Cholesterol - (None) (None) (None) (None) (None) (None) (None)  HDL cholesterol - (None) (None) (None) (None) (None) (None) (None)  Triglycerides - (None) (None) (None) (None) (None) (None) (None)  LDL Direct - (None) (None) (None) (None) (None) (None) (None)  LDL Calc - (None) (None) (None) (None) (None) (None) (None)  Total protein 6.5 - 8.1 g/dL (None) (None) (None) (None) (None) (None) 6.2(L)  Albumin 3.5 - 5.0 g/dL  (None) (None) (None) (None) (None) (None) 3.4(L)   No results found for: TSH Lab Results  Component Value Date   BUN 21* 01/08/2016   BUN 19.9 12/23/2015   BUN 26* 12/12/2015   Lab Results  Component Value Date   CREATININE 1.08* 01/08/2016   CREATININE 0.9 12/23/2015   CREATININE 1.10* 12/12/2015   No results found for: HGBA1C   Assessment/Plan  1. Memory loss worse  2. Essential thrombocythemia ([place]) Continue with anagrelide and hematology consultations  3. Anemia, unspecified anemia type stable  4. Dementia, without behavioral disturbance - donepezil (ARICEPT) 5 MG tablet; One nightly to help preserve memory  Dispense: 28 tablet  Advance to 10 mg nightly after 28 night. Consider for addition of memantine in the future.

## 2016-02-25 DIAGNOSIS — I10 Essential (primary) hypertension: Secondary | ICD-10-CM | POA: Diagnosis not present

## 2016-02-25 LAB — CBC AND DIFFERENTIAL
HEMATOCRIT: 34 % — AB (ref 36–46)
Hemoglobin: 11.3 g/dL — AB (ref 12.0–16.0)
PLATELETS: 802 10*3/uL — AB (ref 150–399)
WBC: 5.9 10^3/mL

## 2016-02-26 ENCOUNTER — Telehealth: Payer: Self-pay | Admitting: Internal Medicine

## 2016-02-26 NOTE — Telephone Encounter (Signed)
I was contacted today by patient's hematologist, Dr. Burr Medico. There is a potential drug interaction between donepezil and anagrelide that may cause QT prolongation and cardiac arrhythmias. I will schedule an EKG at [facility].

## 2016-02-26 NOTE — Telephone Encounter (Signed)
Spoke with [facility] clinic nurse, patient in [place] now. Made appt for [facility] tomorrow 02/27/16 at 3:15.

## 2016-02-27 ENCOUNTER — Non-Acute Institutional Stay: Payer: Medicare Other | Admitting: Nurse Practitioner

## 2016-02-27 ENCOUNTER — Encounter: Payer: Self-pay | Admitting: Nurse Practitioner

## 2016-02-27 VITALS — BP 138/68 | HR 68 | Temp 98.0°F | Ht 61.0 in | Wt 98.4 lb

## 2016-02-27 DIAGNOSIS — F4323 Adjustment disorder with mixed anxiety and depressed mood: Secondary | ICD-10-CM

## 2016-02-27 DIAGNOSIS — I10 Essential (primary) hypertension: Secondary | ICD-10-CM | POA: Diagnosis not present

## 2016-02-27 DIAGNOSIS — D473 Essential (hemorrhagic) thrombocythemia: Secondary | ICD-10-CM

## 2016-02-27 DIAGNOSIS — F0390 Unspecified dementia without behavioral disturbance: Secondary | ICD-10-CM

## 2016-02-27 DIAGNOSIS — R413 Other amnesia: Secondary | ICD-10-CM | POA: Diagnosis not present

## 2016-02-27 DIAGNOSIS — D649 Anemia, unspecified: Secondary | ICD-10-CM

## 2016-02-27 NOTE — Assessment & Plan Note (Signed)
12/16/15 MMSE 24/30, 25/30, continue AL for care needs. Supervision for safety 02/20/16 MMSE 24/30. Failed clock drawing Continue Aricept, f/u EKG in one month.

## 2016-02-27 NOTE — Assessment & Plan Note (Signed)
Stable. Continue observe the patient.

## 2016-02-27 NOTE — Progress Notes (Signed)
Patient ID: [name] [name], female   DOB: [birth year], 82 y.o.   MRN: [id number]  Location:  [facility]   Place of Service: clinic [place] Provider:  Lennie Odor Clinton Dragone NP  GREEN, [name], MD  Patient Care Team: Estill Dooms, MD as PCP - General (Internal Medicine) [place] Maycol Hoying Rhunette Croft, NP as Nurse Practitioner (Nurse Practitioner) Truitt Merle, MD as Consulting Physician (Hematology)  Extended Emergency Contact Information Primary Emergency Contact: [name] Phone: [phone] Relation: Friend  Code Status:  DNR Goals of care: Advanced Directive information Advanced Directives 02/27/2016  Does patient have an advance directive? Yes  Type of Advance Directive [place]  Does patient want to make changes to advanced directive? No - Patient declined  Copy of advanced directive(s) in chart? Yes     Chief Complaint  Patient presents with  . Medical Management of Chronic Issues    patient need EKG    HPI:  Pt is a 82 y.o. female seen today for medical management of chronic diseases.  Hx of HTN, controlled on Atenolol 25mg  bid, essential thrombocythemia, takes Agrylin 0.5mg  bid, last plt 217 01/14/16, memory, short terms mostly, resides in [place] for care need.    The patient was noted to have low grade T, urinary frequency, pacing, 01/30/16 urine culture was positive for UTI, K. Pneumoniae, susceptible to Nitrofurantoin, last treated UTI was 12/2015 with Nitrofurantoin. Denied nausea, vomiting, abd pain, or chest discomfort.    The patient was seen for concerns of Aricept and Anagrelide interaction, prolongation of CT, EKG QT 452., QTC 481, I feel the patient may continue meds, f/u EKG in a few weeks.     Past Medical History  Diagnosis Date  . Glaucoma   . Hypertension   . Arthritis   . Neck fracture ([place])   . Abdominal pain, unspecified site 08/11/2012  . Essential thrombocythemia ([place]) 06/18/2011  . Anemia,  unspecified 06/18/2011  . 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 Loss of weight 06/18/2011  . Lumbago 12/18/2010  . Sciatica 06/19/2010  . Memory loss 12/19/2009  . Diverticulosis of colon (without mention of hemorrhage) 04/20/2007  . Disorder of bone and cartilage, unspecified 04/20/2007  . Hemorrhage of gastrointestinal tract, unspecified 03/09/2007  . Other and unspecified hyperlipidemia 09/01/2006  . BBB (bundle branch block) 03/03/2006  . Cardiac dysrhythmia, unspecified 03/03/2006  . Chronic interstitial cystitis 03/03/2006  . Osteoarthrosis, unspecified whether generalized or localized, unspecified site 03/03/2006  . Hematuria, unspecified 04/22/2004  . Herpes zoster without mention of complication [id number]  . 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 Appendectomy    . Tonsillectomy    . Abdominal hysterectomy    . Cataract extraction w/ intraocular lens  implant, bilateral    . Breast cyst excision Bilateral     benign    Allergies  Allergen Reactions  . Darvocet [Propoxyphene N-Acetaminophen] Nausea And Vomiting  . Darvon Nausea And Vomiting  . Meperidine And Related Nausea And Vomiting  . Vicodin [Hydrocodone-Acetaminophen] Nausea And Vomiting  . Demerol [Meperidine]       Medication List       This list is accurate as of: 02/27/16  4:03 PM.  Always use your most recent med list.               anagrelide 1 MG capsule  Commonly known as:  AGRYLIN  Take 1 mg by mouth 2 (two) times  daily.     aspirin 81 MG tablet  Take 81 mg by mouth daily.     atenolol 50 MG tablet  Commonly known as:  TENORMIN  Take 25 mg by mouth 2 (two) times daily.     calcitonin (salmon) 200 UNIT/ACT nasal spray  Commonly known as:  MIACALCIN/FORTICAL  USE 1 SPRAY IN ALTERNATING NOSTRILS ONCE A DAY FOR BONES     dorzolamide-timolol 22.3-6.8 MG/ML ophthalmic solution  Commonly known as:  COSOPT  Place 1 drop into both eyes 2 (two) times daily.  2% - 0.5%.     feeding supplement Liqd  Take 1 Container by mouth 2 (two) times daily between meals.     flurbiprofen 100 MG tablet  Commonly known as:  ANSAID  TAKE 1 TABLET BY MOUTH EVERY DAY     latanoprost 0.005 % ophthalmic solution  Commonly known as:  XALATAN  Place 1 drop into both eyes daily.     Magnesium 250 MG Tabs  Take 1 tablet by mouth daily.     URELLE 81 MG Tabs tablet  Take 1 tablet by mouth at bedtime as needed.     vitamin E 400 UNIT capsule  Take 400 Units by mouth daily.        Review of Systems  Constitutional: Negative for fever, diaphoresis, activity change, appetite change, fatigue and unexpected weight change.  HENT: Positive for voice change (hoarse since September 2014).   Eyes: Negative.   Respiratory: Negative.   Cardiovascular: Negative.   Gastrointestinal: Negative.   Endocrine: Negative.   Genitourinary: Negative.   Musculoskeletal: Negative.   Skin: Positive for wound.       Left forehead traumatic laceration, sutures intact, no s/s of infection.   Neurological:       Memory loss  Hematological:       Thrombocythemia  Psychiatric/Behavioral: Negative.     Immunization History  Administered Date(s) Administered  . Influenza Whole 08/09/2012, 07/10/2013  . Influenza-Unspecified 08/14/2014, 08/08/2015  . Pneumococcal Polysaccharide-23 11/09/2004  . Zoster 10/07/2006   Pertinent  Health Maintenance Due  Topic Date Due  . PNA vac Low Risk Adult (2 of 2 - PCV13) 11/09/2005  . INFLUENZA VACCINE  06/09/2016  . DEXA SCAN  Completed   Fall Risk  02/27/2016 12/19/2015 08/22/2015 02/21/2015 09/07/2014  Falls in the past year? No No No No No   Functional Status Survey:    Filed Vitals:   02/27/16 1509  BP: 138/68  Pulse: 68  Temp: 98 F (36.7 C)  TempSrc: Oral  Height: 5\' 1"  (1.549 m)  Weight: 98 lb 6.4 oz (44.634 kg)  SpO2: 99%   Body mass index is 18.6 kg/(m^2). Physical Exam  Constitutional: She is oriented to person,  place, and time. She appears well-developed and well-nourished. No distress.  HENT:  Head: Normocephalic and atraumatic.  Right Ear: External ear normal.  Left Ear: External ear normal.  Nose: Nose normal.  Mouth/Throat: Oropharynx is clear and moist.  Eyes: Conjunctivae and EOM are normal. Pupils are equal, round, and reactive to light.  Corrective lenses  Neck: Neck supple. No JVD present. No tracheal deviation present. No thyromegaly present.  Cardiovascular: Normal rate and regular rhythm.  Exam reveals no gallop and no friction rub.   Murmur (2/6/LSB SEM) heard. Pulmonary/Chest: No respiratory distress. She has no wheezes. She has no rales.  Abdominal: She exhibits no distension and no mass. There is no tenderness.  Musculoskeletal: She exhibits no edema or tenderness.  Heberden's  nodes. Bouchard's nodes. Contracture of the right hand.  Lymphadenopathy:    She has no cervical adenopathy.  Neurological: She is alert and oriented to person, place, and time. She has normal reflexes. No cranial nerve deficit. Coordination normal.  08/22/2015 MMSE 28/30. Failed clock drawing.  Skin: No rash noted. No erythema. No pallor.  Left forehead traumatic laceration, sutures intact, no s/s of infection.   Psychiatric: She has a normal mood and affect. Her behavior is normal. Judgment and thought content normal.    Labs reviewed:  Recent Labs  12/12/15 1732 12/23/15 0917 01/08/16 0917  NA 143 140 140  K 3.6 4.2 3.8  CL 108  --  107  CO2 23 27 24   GLUCOSE 158* 91 105*  BUN 26* 19.9 21*  CREATININE 1.10* 0.9 1.08*  CALCIUM 8.8* 8.9 8.5*    Recent Labs  12/12/15 1732 12/23/15 0917 01/08/16 0917  AST 19 15 26   ALT 11* 10 14  ALKPHOS 55 66 47  BILITOT 0.9 0.62 0.6  PROT 6.4* 6.6 6.2*  ALBUMIN 3.4* 3.1* 3.4*    Recent Labs  01/08/16 0917  01/20/16 0954  01/28/16 02/11/16 02/17/16 0859  WBC 4.6  < > 6.4  < > 5.7 5.6 7.6  NEUTROABS 3.5  --  4.8  --   --   --  5.5  HGB 11.1*   < > 11.3*  < > 10.3* 10.6* 11.1*  HCT 36.1  < > 35.5  < > 31* 33* 35.7  MCV 117.2*  --  109.2*  --   --   --  103.5*  PLT 193  < > 717*  < > 718* 1146* 981*  < > = values in this interval not displayed. No results found for: TSH No results found for: HGBA1C Lab Results  Component Value Date   CHOL 178 02/12/2015   HDL 77* 02/12/2015   LDLCALC 83 02/12/2015   TRIG 92 02/12/2015    Significant Diagnostic Results in last 30 days:  No results found.  Assessment/Plan  Adjustment disorder with mixed anxiety and depressed mood Stable. Continue observe the patient.   Anemia 02/25/16 Hgb 11.3, Plt 802  Essential thrombocythemia ([place]) 02/25/16 Hgb 11.3, Plt 802, continue Hydroxyurea 500mg  II M-F, I sat and sun. F/u oncology.    Hypertension Controlled, continue Atenolol 25mg  bid.    Memory loss 12/16/15 MMSE 24/30, 25/30, continue AL for care needs. Supervision for safety 02/20/16 MMSE 24/30. Failed clock drawing Continue Aricept, f/u EKG in one month.      Dementia Continue Aricept, f/u EKG in one month to monitor OT/OTC    Family/ staff Communication: continue AL for care needs.   Labs/tests ordered:  EKG done 02/27/16

## 2016-02-27 NOTE — Assessment & Plan Note (Signed)
Controlled, continue Atenolol 25mg bid.  

## 2016-02-27 NOTE — Assessment & Plan Note (Signed)
Continue Aricept, f/u EKG in one month to monitor OT/OTC

## 2016-02-27 NOTE — Assessment & Plan Note (Signed)
02/25/16 Hgb 11.3, Plt 802

## 2016-02-27 NOTE — Assessment & Plan Note (Signed)
02/25/16 Hgb 11.3, Plt 802, continue Hydroxyurea 500mg  II M-F, I sat and sun. F/u oncology.

## 2016-02-28 ENCOUNTER — Telehealth: Payer: Self-pay | Admitting: *Deleted

## 2016-02-28 NOTE — Telephone Encounter (Signed)
Received vm call from Teresa/nurse/[facility] stating that the pharmacy has alerted them to an interaction with a medication & anagrelide & req call back.  Returned call & left message at [phone] x [phone] to call back.

## 2016-03-03 DIAGNOSIS — I10 Essential (primary) hypertension: Secondary | ICD-10-CM | POA: Diagnosis not present

## 2016-03-10 DIAGNOSIS — I10 Essential (primary) hypertension: Secondary | ICD-10-CM | POA: Diagnosis not present

## 2016-03-11 ENCOUNTER — Encounter: Payer: Self-pay | Admitting: Internal Medicine

## 2016-03-17 DIAGNOSIS — I10 Essential (primary) hypertension: Secondary | ICD-10-CM | POA: Diagnosis not present

## 2016-03-24 ENCOUNTER — Ambulatory Visit (HOSPITAL_BASED_OUTPATIENT_CLINIC_OR_DEPARTMENT_OTHER): Payer: Medicare Other | Admitting: Hematology

## 2016-03-24 ENCOUNTER — Other Ambulatory Visit (HOSPITAL_BASED_OUTPATIENT_CLINIC_OR_DEPARTMENT_OTHER): Payer: Medicare Other

## 2016-03-24 ENCOUNTER — Encounter: Payer: Self-pay | Admitting: Hematology

## 2016-03-24 ENCOUNTER — Telehealth: Payer: Self-pay | Admitting: Hematology

## 2016-03-24 VITALS — BP 119/48 | HR 57 | Temp 98.1°F | Resp 18 | Ht 61.0 in | Wt 98.7 lb

## 2016-03-24 DIAGNOSIS — D649 Anemia, unspecified: Secondary | ICD-10-CM | POA: Diagnosis not present

## 2016-03-24 DIAGNOSIS — I10 Essential (primary) hypertension: Secondary | ICD-10-CM | POA: Diagnosis not present

## 2016-03-24 DIAGNOSIS — R634 Abnormal weight loss: Secondary | ICD-10-CM

## 2016-03-24 DIAGNOSIS — D473 Essential (hemorrhagic) thrombocythemia: Secondary | ICD-10-CM | POA: Diagnosis not present

## 2016-03-24 DIAGNOSIS — R630 Anorexia: Secondary | ICD-10-CM | POA: Diagnosis not present

## 2016-03-24 DIAGNOSIS — F0390 Unspecified dementia without behavioral disturbance: Secondary | ICD-10-CM

## 2016-03-24 LAB — CBC & DIFF AND RETIC
BASO%: 0.6 % (ref 0.0–2.0)
BASOS ABS: 0 10*3/uL (ref 0.0–0.1)
EOS ABS: 0.2 10*3/uL (ref 0.0–0.5)
EOS%: 3.5 % (ref 0.0–7.0)
HEMATOCRIT: 30.9 % — AB (ref 34.8–46.6)
HGB: 9.8 g/dL — ABNORMAL LOW (ref 11.6–15.9)
IMMATURE RETIC FRACT: 15.5 % — AB (ref 1.60–10.00)
LYMPH%: 15.2 % (ref 14.0–49.7)
MCH: 30 pg (ref 25.1–34.0)
MCHC: 31.7 g/dL (ref 31.5–36.0)
MCV: 94.5 fL (ref 79.5–101.0)
MONO#: 0.7 10*3/uL (ref 0.1–0.9)
MONO%: 14.2 % — AB (ref 0.0–14.0)
NEUT#: 3.2 10*3/uL (ref 1.5–6.5)
NEUT%: 66.5 % (ref 38.4–76.8)
Platelets: 502 10*3/uL — ABNORMAL HIGH (ref 145–400)
RBC: 3.27 10*6/uL — ABNORMAL LOW (ref 3.70–5.45)
RDW: 17.2 % — ABNORMAL HIGH (ref 11.2–14.5)
Retic %: 1.04 % (ref 0.70–2.10)
Retic Ct Abs: 34.01 10*3/uL (ref 33.70–90.70)
WBC: 4.8 10*3/uL (ref 3.9–10.3)
lymph#: 0.7 10*3/uL — ABNORMAL LOW (ref 0.9–3.3)
nRBC: 0 % (ref 0–0)

## 2016-03-24 LAB — COMPREHENSIVE METABOLIC PANEL
ALBUMIN: 3.2 g/dL — AB (ref 3.5–5.0)
ALK PHOS: 82 U/L (ref 40–150)
ALT: <9 U/L (ref 0–55)
AST: 16 U/L (ref 5–34)
Anion Gap: 5 mEq/L (ref 3–11)
BILIRUBIN TOTAL: 0.3 mg/dL (ref 0.20–1.20)
BUN: 21 mg/dL (ref 7.0–26.0)
CALCIUM: 8.8 mg/dL (ref 8.4–10.4)
CO2: 27 mEq/L (ref 22–29)
Chloride: 110 mEq/L — ABNORMAL HIGH (ref 98–109)
Creatinine: 0.9 mg/dL (ref 0.6–1.1)
EGFR: 58 mL/min/{1.73_m2} — AB (ref >90)
GLUCOSE: 94 mg/dL (ref 70–140)
POTASSIUM: 4.3 meq/L (ref 3.5–5.1)
SODIUM: 142 meq/L (ref 136–145)
TOTAL PROTEIN: 6.3 g/dL — AB (ref 6.4–8.3)

## 2016-03-24 NOTE — Progress Notes (Signed)
Hematology and Oncology Follow Up Visit Date of Visit: 03/24/2016   [name] [name] [name] [name] 10/06/1923 82 y.o. GREEN, [name], MD  Chief Complaint: Essential Thrombocytosis.  Principle Diagnosis: Essential thrombocytosis, intitiated on Hydrea on 07/05/2011; JAK2 mutation (-), BCR-ABL(-)   Previous therapy:  1. Initially the patient was on Hydrea 1500 mg QOD and 1000 mg QOD, with significant drop in platelet count to 60,000 and neutropenia, hydrea was decreased, dose uncertain due to pt's dementia and memory issue since 11/07/2015, held on 12/26/2015 2. Anagrelide 0.5 mg twice daily started on 12/26/2015, dose increased to 2mg  bid on 02/17/2016  Current therapy:  Anagrelide 2 mg twice daily,  Aspirin 81 mg for thromboprophylaxis.  Interim History:  [name] returns to the office for follow-up. She is accompanied by her son to the clinic today. She is doing well overall, denies any new symptoms. She has a few mild bruises on her arms, no other signs of bleeding. She has some difficulty falling asleep, does not feel she needs medication. She also reports night sweats in the past, which has resolved in the past week. No fever or chills. Her weight has been stable.  Medications: I have reviewed the patient's current medications.  Current Outpatient Prescriptions  Medication Sig Dispense Refill  . anagrelide (AGRYLIN) 1 MG capsule Take 1 mg by mouth 2 (two) times daily.    Marland Kitchen aspirin 81 MG tablet Take 81 mg by mouth daily.    Marland Kitchen atenolol (TENORMIN) 50 MG tablet Take 25 mg by mouth 2 (two) times daily.     . calcitonin, salmon, (MIACALCIN/FORTICAL) 200 UNIT/ACT nasal spray USE 1 SPRAY IN ALTERNATING NOSTRILS ONCE A DAY FOR BONES 3.7 mL 11  . dorzolamide-timolol (COSOPT) 22.3-6.8 MG/ML ophthalmic solution Place 1 drop into both eyes 2 (two) times daily. 2% - 0.5%.    . feeding supplement (BOOST / RESOURCE BREEZE) LIQD Take 1 Container by mouth 2 (two) times daily between meals.    . flurbiprofen  (ANSAID) 100 MG tablet TAKE 1 TABLET BY MOUTH EVERY DAY 90 tablet 2  . latanoprost (XALATAN) 0.005 % ophthalmic solution Place 1 drop into both eyes daily.    . Magnesium 250 MG TABS Take 1 tablet by mouth daily.     Marland Kitchen URELLE (URELLE/URISED) 81 MG TABS Take 1 tablet by mouth at bedtime as needed.     . vitamin E 400 UNIT capsule Take 400 Units by mouth daily.      No current facility-administered medications for this visit.   Allergies:  Allergies  Allergen Reactions  . Darvocet [Propoxyphene N-Acetaminophen] Nausea And Vomiting  . Darvon Nausea And Vomiting  . Meperidine And Related Nausea And Vomiting  . Vicodin [Hydrocodone-Acetaminophen] Nausea And Vomiting  . Demerol [Meperidine]    Past Medical History, Surgical history, Social history, and Family History were reviewed and updated.  Review of Systems: Constitutional:  Negative for fever, chills, night sweats, anorexia, weight loss, pain. Cardiovascular: no chest pain or dyspnea on exertion Respiratory: no cough, shortness of breath, or wheezing Neurological: no TIA or stroke symptoms, (+) memory loss Dermatological: negative for rash ENT: negative for - epistaxis or headaches Skin: Negative. Gastrointestinal: no abdominal pain, change in bowel habits, or black or bloody stools Genito-Urinary: no dysuria, trouble voiding, or hematuria Hematological and Lymphatic: negative for - bleeding problems or blood clots Breast: negative for breast lumps Musculoskeletal: negative for - gait disturbance Remaining ROS negative.  Physical Exam: There were no vitals taken for this visit. ECOG: 0  General appearance: alert, cooperative, appears stated age and no distress Head: Normocephalic, without obvious abnormality, atraumatic Neck: no adenopathy, supple, symmetrical, trachea midline and thyroid not enlarged, symmetric, no tenderness/mass/nodules HEENT: OP clear; PERRL; EOMi.  Lymph nodes: Cervical, supraclavicular, and axillary  nodes normal. Heart:regular rate and rhythm, S1, S2 normal, no murmur, click, rub or gallop Lung:chest clear, no wheezing, rales, normal symmetric air entry Abdomin: soft, non-tender, without masses or organomegaly EXT:No peripheral edema Skin: No bruises or skin rashes  Lab Results: CBC Latest Ref Rng 02/25/2016 02/17/2016 02/11/2016  WBC - 5.9 7.6 5.6  Hemoglobin 12.0 - 16.0 g/dL 11.3(A) 11.1(L) 10.6(A)  Hematocrit 36 - 46 % 34(A) 35.7 33(A)  Platelets 150 - 399 K/L 802(A) 981(H) 1146(A)    CMP Latest Ref Rng 01/08/2016 12/23/2015 12/12/2015  Glucose 65 - 99 mg/dL 105(H) 91 158(H)  BUN 6 - 20 mg/dL 21(H) 19.9 26(H)  Creatinine 0.44 - 1.00 mg/dL 1.08(H) 0.9 1.10(H)  Sodium 135 - 145 mmol/L 140 140 143  Potassium 3.5 - 5.1 mmol/L 3.8 4.2 3.6  Chloride 101 - 111 mmol/L 107 - 108  CO2 22 - 32 mmol/L 24 27 23   Calcium 8.9 - 10.3 mg/dL 8.5(L) 8.9 8.8(L)  Total Protein 6.5 - 8.1 g/dL 6.2(L) 6.6 6.4(L)  Total Bilirubin 0.3 - 1.2 mg/dL 0.6 0.62 0.9  Alkaline Phos 38 - 126 U/L 47 66 55  AST 15 - 41 U/L 26 15 19   ALT 14 - 54 U/L 14 10 11(L)   ANC 4.8 today     Impression and Plan: 82 year old Caucasian female  1. Essential thrombocythemia -We reviewed the natural course of essential thrombocythemia with patient and her family members, and the risk of major combination of thrombosis. -Her plt was around 1100K in early 2017, I have gradually increased her anagrelide dose to 2 mg twice daily  -Her platelet count has gradually improved, 502K today, [facility]. I'll continue anagrelide 2 mg twice daily - [facility]. -If her platelet counts remain to be persistently above 500, I'll increase anagrelide dose to 3 mg in the morning, and 2 mg in the evening.  -She'll continue aspirin 81 mg daily  2. Dementia -she [facility]  -She'll follow-up with her primary care physician Dr. Nyoka Cowden  -I  discussed her with Dr. Nyoka Cowden about the interaction of Aricept and anagrelide, which can cause QT prolongation. She is being monitored. She is on low-dose of Aricept.  3. Anorexia and weight loss -She has been taking some protein shake, gained some weight lately. -I encouraged her to continue nutrition supplement.  4. Anemia -secondary to hydrea -will continue monitoring, no need for blood transfusion  5. She will continue follow up with her PCP for other medical issues.    Plan:  -continue anagrelide to 2mg  twice daily, and a [facility], [facility].  -if her plt>500K in the next few months, I will increase anagrelide to 3 mg in the morning and 2 mg in the evening  -I will copy my note to her PCP Dr. Hervey Ard - I'll see her back in 3 months   Truitt Merle  03/24/2016

## 2016-03-24 NOTE — Telephone Encounter (Signed)
per of to sch pt appt-gave pt copy of avs °

## 2016-03-30 DIAGNOSIS — I10 Essential (primary) hypertension: Secondary | ICD-10-CM | POA: Diagnosis not present

## 2016-04-21 DIAGNOSIS — I10 Essential (primary) hypertension: Secondary | ICD-10-CM | POA: Diagnosis not present

## 2016-04-21 LAB — CBC AND DIFFERENTIAL
HEMATOCRIT: 29 % — AB (ref 36–46)
HEMOGLOBIN: 8.9 g/dL — AB (ref 12.0–16.0)
PLATELETS: 470 10*3/uL — AB (ref 150–399)
WBC: 4.5 10*3/mL

## 2016-04-23 DIAGNOSIS — I10 Essential (primary) hypertension: Secondary | ICD-10-CM | POA: Diagnosis not present

## 2016-04-23 LAB — CBC AND DIFFERENTIAL
HCT: 28 % — AB (ref 36–46)
Hemoglobin: 9.3 g/dL — AB (ref 12.0–16.0)
Platelets: 449 10*3/uL — AB (ref 150–399)
WBC: 4.5 10^3/mL

## 2016-04-28 DIAGNOSIS — H401111 Primary open-angle glaucoma, right eye, mild stage: Secondary | ICD-10-CM | POA: Diagnosis not present

## 2016-04-28 DIAGNOSIS — H1851 Endothelial corneal dystrophy: Secondary | ICD-10-CM | POA: Diagnosis not present

## 2016-04-28 DIAGNOSIS — H401123 Primary open-angle glaucoma, left eye, severe stage: Secondary | ICD-10-CM | POA: Diagnosis not present

## 2016-04-28 DIAGNOSIS — Z961 Presence of intraocular lens: Secondary | ICD-10-CM | POA: Diagnosis not present

## 2016-04-30 ENCOUNTER — Encounter: Payer: Self-pay | Admitting: Internal Medicine

## 2016-05-04 ENCOUNTER — Telehealth: Payer: Self-pay | Admitting: Hematology

## 2016-05-04 NOTE — Telephone Encounter (Signed)
Received outside lab CBC 03/24/2016: wbc 4.8, Hb 9.8, plt 502K 04/23/2016: WBC 4.5, Hb 9.3, plt 449K   Truitt Merle  05/04/2016

## 2016-05-11 DIAGNOSIS — I10 Essential (primary) hypertension: Secondary | ICD-10-CM | POA: Diagnosis not present

## 2016-05-11 DIAGNOSIS — N390 Urinary tract infection, site not specified: Secondary | ICD-10-CM | POA: Diagnosis not present

## 2016-05-14 ENCOUNTER — Non-Acute Institutional Stay: Payer: Medicare Other | Admitting: Internal Medicine

## 2016-05-14 ENCOUNTER — Encounter: Payer: Self-pay | Admitting: Internal Medicine

## 2016-05-14 VITALS — BP 138/58 | HR 56 | Temp 97.8°F | Ht 61.0 in | Wt 97.0 lb

## 2016-05-14 DIAGNOSIS — R413 Other amnesia: Secondary | ICD-10-CM | POA: Diagnosis not present

## 2016-05-14 DIAGNOSIS — D649 Anemia, unspecified: Secondary | ICD-10-CM | POA: Diagnosis not present

## 2016-05-14 DIAGNOSIS — R634 Abnormal weight loss: Secondary | ICD-10-CM

## 2016-05-14 DIAGNOSIS — D473 Essential (hemorrhagic) thrombocythemia: Secondary | ICD-10-CM | POA: Diagnosis not present

## 2016-05-14 DIAGNOSIS — E785 Hyperlipidemia, unspecified: Secondary | ICD-10-CM | POA: Diagnosis not present

## 2016-05-14 DIAGNOSIS — I10 Essential (primary) hypertension: Secondary | ICD-10-CM | POA: Diagnosis not present

## 2016-05-14 MED ORDER — MEMANTINE HCL 10 MG PO TABS: ORAL_TABLET | ORAL | Status: AC

## 2016-05-14 NOTE — Progress Notes (Signed)
Patient ID: [name] [name], female   DOB: 04-05-1923, 82 y.o.   MRN: [id number]    [place] Room Number: 144  Place of Service: Clinic (12)     Allergies  Allergen Reactions  . Darvocet [Propoxyphene N-Acetaminophen] Nausea And Vomiting  . Darvon Nausea And Vomiting  . Meperidine And Related Nausea And Vomiting  . Vicodin [Hydrocodone-Acetaminophen] Nausea And Vomiting  . Demerol [Meperidine]     Chief Complaint  Patient presents with  . Medical Management of Chronic Issues    2 month medication management memory, thrombocythemia, dementia.    HPI:  Essential thrombocythemia ([place]) - Stable at 470,000 in June 2017. Continues to see Dr. Burr Medico. Currently treated with anagrelide.  Anemia, unspecified anemia type - last hemoglobin 8.9. Falling as compared to previous values. Dr. Marc Morgans was suspicious that this is related to previous treatment with Hydrea.  Hyperlipidemia - follow-up lab prior to next visit  Essential hypertension -  cpntrolled  Loss of weight - stable as co mpared to last visit  Memory loss - continues with memory decline. Using Aricept.    Medications: Patient's Medications  New Prescriptions   No medications on file  Previous Medications   ANAGRELIDE (AGRYLIN) 1 MG CAPSULE    Take 1 mg by mouth. Take 2 tablets twice daily   ASPIRIN 81 MG TABLET    Take 81 mg by mouth daily.   ATENOLOL (TENORMIN) 50 MG TABLET    Take 25 mg by mouth 2 (two) times daily.    CALCITONIN, SALMON, (MIACALCIN/FORTICAL) 200 UNIT/ACT NASAL SPRAY    USE 1 SPRAY IN ALTERNATING NOSTRILS ONCE A DAY FOR BONES   DONEPEZIL (ARICEPT) 10 MG TABLET    Take one tablet at bedtime for memory   DORZOLAMIDE-TIMOLOL (COSOPT) 22.3-6.8 MG/ML OPHTHALMIC SOLUTION    Place 1 drop into both eyes 2 (two) times daily. 2% - 0.5%.   FEEDING SUPPLEMENT (BOOST / RESOURCE BREEZE) LIQD    Take 1 Container by mouth 2 (two) times daily between meals.   FLURBIPROFEN (ANSAID) 100 MG TABLET     TAKE 1 TABLET BY MOUTH EVERY DAY   LATANOPROST (XALATAN) 0.005 % OPHTHALMIC SOLUTION    Place 1 drop into both eyes daily.   MAGNESIUM 250 MG TABS    Take 1 tablet by mouth daily.    URELLE (URELLE/URISED) 81 MG TABS    Take 1 tablet by mouth at bedtime as needed.    VITAMIN E 400 UNIT CAPSULE    Take 400 Units by mouth daily.   Modified Medications   No medications on file  Discontinued Medications   No medications on file     Review of Systems  Constitutional: Negative for fever, chills, diaphoresis, activity change, appetite change, fatigue and unexpected weight change.  HENT: Positive for voice change (hoarse since September 2014). Negative for congestion, ear discharge, ear pain, hearing loss, postnasal drip, rhinorrhea, sore throat, tinnitus and trouble swallowing.   Eyes: Negative.  Negative for pain, redness, itching and visual disturbance.  Respiratory: Negative.  Negative for cough, choking, shortness of breath and wheezing.   Cardiovascular: Negative.  Negative for chest pain, palpitations and leg swelling.  Gastrointestinal: Negative.  Negative for nausea, abdominal pain, diarrhea, constipation and abdominal distention.  Endocrine: Negative.  Negative for cold intolerance, heat intolerance, polydipsia, polyphagia and polyuria.  Genitourinary: Negative.  Negative for dysuria, urgency, frequency, hematuria, flank pain, vaginal discharge, difficulty urinating and pelvic pain.  Musculoskeletal: Negative.  Negative for myalgias,  back pain, arthralgias, gait problem, neck pain and neck stiffness.  Skin: Positive for wound. Negative for color change, pallor and rash.  Allergic/Immunologic: Negative.   Neurological: Negative for dizziness, tremors, seizures, syncope, weakness, numbness and headaches.       Memory loss  Hematological: Negative for adenopathy. Does not bruise/bleed easily.       Thrombocythemia  Psychiatric/Behavioral: Negative.  Negative for suicidal ideas,  hallucinations, behavioral problems, confusion, sleep disturbance, dysphoric mood and agitation. The patient is not nervous/anxious and is not hyperactive.     Filed Vitals:   05/14/16 1409  BP: 138/58  Pulse: 56  Temp: 97.8 F (36.6 C)  TempSrc: Oral  Height: '5\' 1"'  (1.549 m)  Weight: 97 lb (43.999 kg)  SpO2: 98%   Wt Readings from Last 3 Encounters:  05/14/16 97 lb (43.999 kg)  03/24/16 98 lb 11.2 oz (44.77 kg)  02/27/16 98 lb 6.4 oz (44.634 kg)    Body mass index is 18.34 kg/(m^2).  Physical Exam  Constitutional: She is oriented to person, place, and time. She appears well-developed and well-nourished. No distress.  HENT:  Head: Normocephalic and atraumatic.  Right Ear: External ear normal.  Left Ear: External ear normal.  Nose: Nose normal.  Mouth/Throat: Oropharynx is clear and moist.  Eyes: Conjunctivae and EOM are normal. Pupils are equal, round, and reactive to light.  Corrective lenses  Neck: Neck supple. No JVD present. No tracheal deviation present. No thyromegaly present.  Cardiovascular: Normal rate and regular rhythm.  Exam reveals no gallop and no friction rub.   Murmur (2/6/LSB SEM) heard. Pulmonary/Chest: No respiratory distress. She has no wheezes. She has no rales.  Abdominal: She exhibits no distension and no mass. There is no tenderness.  Musculoskeletal: She exhibits no edema or tenderness.  Heberden's nodes. Bouchard's nodes. Contracture of the right hand.  Lymphadenopathy:    She has no cervical adenopathy.  Neurological: She is alert and oriented to person, place, and time. She has normal reflexes. No cranial nerve deficit. Coordination normal.  08/22/2015 MMSE 28/30. Failed clock drawing.  Skin: No rash noted. No erythema. No pallor.  Left forehead traumatic laceration, sutures intact, no s/s of infection.   Psychiatric: She has a normal mood and affect. Her behavior is normal. Judgment and thought content normal.     Labs reviewed: Lab  Summary Latest Ref Rng 04/21/2016 03/24/2016 02/25/2016 02/17/2016 02/11/2016  Hemoglobin 12.0 - 16.0 g/dL 8.9(A) 9.8(L) 11.3(A) 11.1(L) 10.6(A)  Hematocrit 36 - 46 % 29(A) 30.9(L) 34(A) 35.7 33(A)  White count - 4.5 4.8 5.9 7.6 5.6  Platelet count 150 - 399 K/L 470(A) 502(H) 802(A) 981(H) 1146(A)  Sodium 136 - 145 mEq/L (None) 142 (None) (None) (None)  Potassium 3.5 - 5.1 mEq/L (None) 4.3 (None) (None) (None)  Calcium 8.4 - 10.4 mg/dL (None) 8.8 (None) (None) (None)  Phosphorus - (None) (None) (None) (None) (None)  Creatinine 0.6 - 1.1 mg/dL (None) 0.9 (None) (None) (None)  AST 5 - 34 U/L (None) 16 (None) (None) (None)  Alk Phos 40 - 150 U/L (None) 82 (None) (None) (None)  Bilirubin 0.20 - 1.20 mg/dL (None) 0.30 (None) (None) (None)  Glucose 70 - 140 mg/dl (None) 94 (None) (None) (None)  Cholesterol - (None) (None) (None) (None) (None)  HDL cholesterol - (None) (None) (None) (None) (None)  Triglycerides - (None) (None) (None) (None) (None)  LDL Direct - (None) (None) (None) (None) (None)  LDL Calc - (None) (None) (None) (None) (None)  Total protein 6.4 - 8.3  g/dL (None) 6.3(L) (None) (None) (None)  Albumin 3.5 - 5.0 g/dL (None) 3.2(L) (None) (None) (None)   No results found for: TSH Lab Results  Component Value Date   BUN 21.0 03/24/2016   BUN 21* 01/08/2016   BUN 19.9 12/23/2015   Lab Results  Component Value Date   CREATININE 0.9 03/24/2016   CREATININE 1.08* 01/08/2016   CREATININE 0.9 12/23/2015   No results found for: HGBA1C   Assessment/Plan  1. Essential thrombocythemia ([place]) Continue current medications  2. Anemia, unspecified anemia type Follow-up with Dr. Burr Medico 06/18/2016  3. Hyperlipidemia -Check lipid panel  4. Essential hypertension Controlled  5. Loss of weight Stable  6. Memory loss -Continue Aricept 10 mg at bedtime -Start and Titrate up memantine - memantine (NAMENDA) 10 MG tablet; One twice daily to help preserve memory  Dispense: 60 tablet;  Refill: 5

## 2016-05-21 DIAGNOSIS — I10 Essential (primary) hypertension: Secondary | ICD-10-CM | POA: Diagnosis not present

## 2016-05-21 LAB — PROTIME-INR: Protime: 23.4 seconds — AB (ref 10.0–13.8)

## 2016-05-21 LAB — CBC AND DIFFERENTIAL
HEMATOCRIT: 28 % — AB (ref 36–46)
HEMOGLOBIN: 8.9 g/dL — AB (ref 12.0–16.0)
PLATELETS: 501 10*3/uL — AB (ref 150–399)
WBC: 5.3 10*3/mL

## 2016-05-21 LAB — POCT INR: INR: 2.3 — AB (ref <=1.1)

## 2016-05-28 ENCOUNTER — Other Ambulatory Visit: Payer: Self-pay | Admitting: *Deleted

## 2016-06-08 ENCOUNTER — Telehealth: Payer: Self-pay | Admitting: *Deleted

## 2016-06-08 NOTE — Telephone Encounter (Signed)
Received call from [facility] [place] [facility] [place] wanting to know if pt still needs weekly CBC to be drawn at the nursing center.   Message sent to desk nurse. Teresa's   Phone    [phone]  Ext   [phone].

## 2016-06-11 ENCOUNTER — Encounter: Payer: Self-pay | Admitting: Hematology

## 2016-06-11 DIAGNOSIS — I10 Essential (primary) hypertension: Secondary | ICD-10-CM | POA: Diagnosis not present

## 2016-06-11 LAB — HEPATIC FUNCTION PANEL
ALK PHOS: 63 U/L (ref 25–125)
ALT: 7 U/L (ref 7–35)
AST: 15 U/L (ref 13–35)
Bilirubin, Total: 0.3 mg/dL

## 2016-06-11 LAB — BASIC METABOLIC PANEL
BUN: 26 mg/dL — AB (ref 4–21)
CREATININE: 1 mg/dL (ref <=1.1)
Glucose: 71 mg/dL
Potassium: 4.8 mmol/L (ref 3.4–5.3)
SODIUM: 142 mmol/L (ref 137–147)

## 2016-06-11 LAB — CBC AND DIFFERENTIAL
HCT: 29 % — AB (ref 36–46)
HEMOGLOBIN: 9.3 g/dL — AB (ref 12.0–16.0)
PLATELETS: 509 10*3/uL — AB (ref 150–399)
WBC: 5 10^3/mL

## 2016-06-12 ENCOUNTER — Other Ambulatory Visit: Payer: Self-pay | Admitting: *Deleted

## 2016-06-17 ENCOUNTER — Encounter: Payer: Self-pay | Admitting: Nurse Practitioner

## 2016-06-17 ENCOUNTER — Non-Acute Institutional Stay: Payer: Medicare Other | Admitting: Nurse Practitioner

## 2016-06-17 DIAGNOSIS — F4323 Adjustment disorder with mixed anxiety and depressed mood: Secondary | ICD-10-CM | POA: Diagnosis not present

## 2016-06-17 DIAGNOSIS — I10 Essential (primary) hypertension: Secondary | ICD-10-CM

## 2016-06-17 DIAGNOSIS — D649 Anemia, unspecified: Secondary | ICD-10-CM | POA: Diagnosis not present

## 2016-06-17 DIAGNOSIS — F0390 Unspecified dementia without behavioral disturbance: Secondary | ICD-10-CM | POA: Diagnosis not present

## 2016-06-17 DIAGNOSIS — D473 Essential (hemorrhagic) thrombocythemia: Secondary | ICD-10-CM | POA: Diagnosis not present

## 2016-06-17 NOTE — Assessment & Plan Note (Signed)
Continue Namenda, Aricept, f/u EKG in one month to monitor OT/OTC

## 2016-06-17 NOTE — Assessment & Plan Note (Signed)
Stable. Continue observe the patient.

## 2016-06-17 NOTE — Assessment & Plan Note (Addendum)
06/11/16 Hgb 9.3, Na 142, K 4.8, Bun 26, creat 1.02, f/u oncology, may consider Omeprazole 20mg  daily x 6 weeks in setting of Flurbiprofen 100mg  daily

## 2016-06-17 NOTE — Progress Notes (Signed)
Location:   [facility] Room Number: [id number] of Service:  SNF (31) Provider:  Marlana Latus  NP    Patient Care Team: Estill Dooms, MD as PCP - General (Internal Medicine) [place] Otho Darner, NP as Nurse Practitioner (Nurse Practitioner) Truitt Merle, MD as Consulting Physician (Hematology)  Extended Emergency Contact Information Primary Emergency Contact: [name] Phone: [phone] Relation: Friend  Code Status:  DNR Goals of care: Advanced Directive information Advanced Directives 06/17/2016  Does patient have an advance directive? Yes  Type of Paramedic of [place];Living will  Does patient want to make changes to advanced directive? No - Patient declined  Copy of advanced directive(s) in chart? Yes  Would patient like information on creating an advanced directive? -     Chief Complaint  Patient presents with  . Medical Management of Chronic Issues    HPI:  Pt is a 82 y.o. female seen today for medical management of chronic diseases.    Hx of HTN, controlled on Atenolol 25mg  bid, essential thrombocythemia, takes Agrylin 0.5mg  bid, last plt 217 01/14/16, memory, short terms mostly, talking Namenda, Aricept,  resides in AL for care need.   Past Medical History:  Diagnosis Date  . Abdominal pain, unspecified site 08/11/2012  . Anemia, unspecified 06/18/2011  . Arthritis   . BBB (bundle branch block) 03/03/2006  . Cardiac dysrhythmia, unspecified 03/03/2006  . Chronic interstitial cystitis 03/03/2006  . 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 Disorder of bone and cartilage, unspecified 04/20/2007  . Diverticulosis of colon (without mention of hemorrhage) 04/20/2007  . Essential thrombocythemia ([facility]) 06/18/2011  . Glaucoma   . Hematuria, unspecified 04/22/2004  . Hemorrhage of gastrointestinal tract, unspecified 03/09/2007  . Herpes zoster without mention of complication [id number]  .  Hypertension   . 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 Loss of weight 06/18/2011  . Lumbago 12/18/2010  . Memory loss 12/19/2009  . Neck fracture ([place])   . Osteoarthrosis, unspecified whether generalized or localized, unspecified site 03/03/2006  . Other and unspecified hyperlipidemia 09/01/2006  . Sciatica 06/19/2010   Past Surgical History:  Procedure Laterality Date  . ABDOMINAL HYSTERECTOMY    . APPENDECTOMY    . BREAST CYST EXCISION Bilateral    benign  . CATARACT EXTRACTION W/ INTRAOCULAR LENS  IMPLANT, BILATERAL    . TONSILLECTOMY      Allergies  Allergen Reactions  . Darvocet [Propoxyphene N-Acetaminophen] Nausea And Vomiting  . Darvon Nausea And Vomiting  . Meperidine And Related Nausea And Vomiting  . Vicodin [Hydrocodone-Acetaminophen] Nausea And Vomiting  . Demerol [Meperidine]       Medication List       Accurate as of 06/17/16  3:25 PM. Always use your most recent med list.          anagrelide 1 MG capsule Commonly known as:  AGRYLIN Take 1 mg by mouth. Take 2 tablets twice daily   aspirin 81 MG tablet Take 81 mg by mouth daily.   atenolol 50 MG tablet Commonly known as:  TENORMIN Take 25 mg by mouth 2 (two) times daily.   CALCIUM 600/VITAMIN D 600-400 MG-UNIT Tabs Generic drug:  Calcium Carbonate-Vitamin D3 Take 1 tablet by mouth 2 (two) times daily with a meal.   donepezil 10 MG tablet Commonly known as:  ARICEPT Take one tablet at bedtime for memory   dorzolamide-timolol 22.3-6.8 MG/ML ophthalmic solution Commonly known as:  COSOPT Place 1  drop into both eyes 2 (two) times daily. 2% - 0.5%.   feeding supplement Liqd Take 1 Container by mouth 2 (two) times daily between meals.   flurbiprofen 100 MG tablet Commonly known as:  ANSAID TAKE 1 TABLET BY MOUTH EVERY DAY   latanoprost 0.005 % ophthalmic solution Commonly known as:  XALATAN Place 1 drop into both eyes daily.   Magnesium 250 MG Tabs Take 1 tablet by mouth daily.   memantine  10 MG tablet Commonly known as:  NAMENDA One twice daily to help preserve memory   URELLE 81 MG Tabs tablet Take 1 tablet by mouth at bedtime as needed.       Review of Systems  Constitutional: Negative for activity change, appetite change, chills, diaphoresis, fatigue, fever and unexpected weight change.  HENT: Positive for voice change (hoarse since September 2014). Negative for congestion, ear discharge, ear pain, hearing loss, postnasal drip, rhinorrhea, sore throat, tinnitus and trouble swallowing.   Eyes: Negative.  Negative for pain, redness, itching and visual disturbance.  Respiratory: Negative.  Negative for cough, choking, shortness of breath and wheezing.   Cardiovascular: Negative.  Negative for chest pain, palpitations and leg swelling.  Gastrointestinal: Negative.  Negative for abdominal distention, abdominal pain, constipation, diarrhea and nausea.  Endocrine: Negative.  Negative for cold intolerance, heat intolerance, polydipsia, polyphagia and polyuria.  Genitourinary: Negative.  Negative for difficulty urinating, dysuria, flank pain, frequency, hematuria, pelvic pain, urgency and vaginal discharge.  Musculoskeletal: Negative.  Negative for arthralgias, back pain, gait problem, myalgias, neck pain and neck stiffness.  Skin: Positive for wound. Negative for color change, pallor and rash.  Allergic/Immunologic: Negative.   Neurological: Negative for dizziness, tremors, seizures, syncope, weakness, numbness and headaches.       Memory loss  Hematological: Negative for adenopathy. Does not bruise/bleed easily.       Thrombocythemia  Psychiatric/Behavioral: Negative.  Negative for agitation, behavioral problems, confusion, dysphoric mood, hallucinations, sleep disturbance and suicidal ideas. The patient is not nervous/anxious and is not hyperactive.     Immunization History  Administered Date(s) Administered  . Influenza Whole 08/09/2012, 07/10/2013  .  Influenza-Unspecified 08/14/2014, 08/08/2015  . Pneumococcal Polysaccharide-23 11/09/2004  . Zoster 10/07/2006   Pertinent  Health Maintenance Due  Topic Date Due  . PNA vac Low Risk Adult (2 of 2 - PCV13) 11/09/2005  . INFLUENZA VACCINE  06/09/2016  . DEXA SCAN  Completed   Fall Risk  02/27/2016 12/19/2015 08/22/2015 02/21/2015 09/07/2014  Falls in the past year? No No No No No   Functional Status Survey:    Vitals:   06/17/16 1110  BP: 120/70  Pulse: 66  Resp: 18  Temp: 97.2 F (36.2 C)  Weight: 97 lb (44 kg)  Height: 5\' 1"  (1.549 m)   Body mass index is 18.33 kg/m. Physical Exam  Constitutional: She is oriented to person, place, and time. She appears well-developed and well-nourished. No distress.  HENT:  Head: Normocephalic and atraumatic.  Right Ear: External ear normal.  Left Ear: External ear normal.  Nose: Nose normal.  Mouth/Throat: Oropharynx is clear and moist.  Eyes: Conjunctivae and EOM are normal. Pupils are equal, round, and reactive to light.  Corrective lenses  Neck: Neck supple. No JVD present. No tracheal deviation present. No thyromegaly present.  Cardiovascular: Normal rate and regular rhythm.  Exam reveals no gallop and no friction rub.   Murmur (2/6/LSB SEM) heard. Pulmonary/Chest: No respiratory distress. She has no wheezes. She has no rales.  Abdominal: She  exhibits no distension and no mass. There is no tenderness.  Musculoskeletal: She exhibits no edema or tenderness.  Heberden's nodes. Bouchard's nodes. Contracture of the right hand.  Lymphadenopathy:    She has no cervical adenopathy.  Neurological: She is alert and oriented to person, place, and time. She has normal reflexes. No cranial nerve deficit. Coordination normal.  08/22/2015 MMSE 28/30. Failed clock drawing.  Skin: No rash noted. No erythema. No pallor.  Left forehead traumatic laceration, sutures intact, no s/s of infection.   Psychiatric: She has a normal mood and affect. Her  behavior is normal. Judgment and thought content normal.    Labs reviewed:  Recent Labs  12/12/15 1732 12/23/15 0917 01/08/16 0917 03/24/16 0838 06/11/16  NA 143 140 140 142 142  K 3.6 4.2 3.8 4.3 4.8  CL 108  --  107  --   --   CO2 23 27 24 27   --   GLUCOSE 158* 91 105* 94  --   BUN 26* 19.9 21* 21.0 26*  CREATININE 1.10* 0.9 1.08* 0.9 1.0  CALCIUM 8.8* 8.9 8.5* 8.8  --     Recent Labs  12/23/15 0917 01/08/16 0917 03/24/16 0838 06/11/16  AST 15 26 16 15   ALT 10 14 <9 7  ALKPHOS 66 47 82 63  BILITOT 0.62 0.6 0.30  --   PROT 6.6 6.2* 6.3*  --   ALBUMIN 3.1* 3.4* 3.2*  --     Recent Labs  01/20/16 0954  02/17/16 0859  03/24/16 0838  04/23/16 05/21/16 06/11/16  WBC 6.4  < > 7.6  < > 4.8  < > 4.5 5.3 5.0  NEUTROABS 4.8  --  5.5  --  3.2  --   --   --   --   HGB 11.3*  < > 11.1*  < > 9.8*  < > 9.3* 8.9* 9.3*  HCT 35.5  < > 35.7  < > 30.9*  < > 28* 28* 29*  MCV 109.2*  --  103.5*  --  94.5  --   --   --   --   PLT 717*  < > 981*  < > 502*  < > 449* 501* 509*  < > = values in this interval not displayed. No results found for: TSH No results found for: HGBA1C Lab Results  Component Value Date   CHOL 178 02/12/2015   HDL 77 (A) 02/12/2015   LDLCALC 83 02/12/2015   TRIG 92 02/12/2015    Significant Diagnostic Results in last 30 days:  No results found.  Assessment/Plan There are no diagnoses linked to this encounter.Hypertension Controlled, continue Atenolol 25mg  bid. 06/11/16 Hgb 9.3, Na 142, K 4.8, Bun 26, creat 1.02      Dementia Continue Namenda, Aricept, f/u EKG in one month to monitor OT/OTC  Essential thrombocythemia ([facility]) 06/11/16 Hgb 9.3, Na 142, K 4.8, Bun 26, creat 1.02  02/25/16 Hgb 11.3, Plt 802, continue Hydroxyurea 500mg  II M-F, I sat and sun. F/u oncology.    Anemia 06/11/16 Hgb 9.3, Na 142, K 4.8, Bun 26, creat 1.02, f/u oncology, may consider Omeprazole 20mg  daily x 6 weeks in setting of Flurbiprofen 100mg  daily    Adjustment disorder  with mixed anxiety and depressed mood Stable. Continue observe the patient.      Family/ staff Communication: continue AL for care assistance  Labs/tests ordered:  none

## 2016-06-17 NOTE — Assessment & Plan Note (Addendum)
Controlled, continue Atenolol 25mg  bid. 06/11/16 Hgb 9.3, Na 142, K 4.8, Bun 26, creat 1.02

## 2016-06-17 NOTE — Assessment & Plan Note (Signed)
06/11/16 Hgb 9.3, Na 142, K 4.8, Bun 26, creat 1.02  02/25/16 Hgb 11.3, Plt 802, continue Hydroxyurea 500mg  II M-F, I sat and sun. F/u oncology.

## 2016-06-18 ENCOUNTER — Ambulatory Visit (HOSPITAL_BASED_OUTPATIENT_CLINIC_OR_DEPARTMENT_OTHER): Payer: Medicare Other | Admitting: Hematology

## 2016-06-18 ENCOUNTER — Other Ambulatory Visit (HOSPITAL_BASED_OUTPATIENT_CLINIC_OR_DEPARTMENT_OTHER): Payer: Medicare Other

## 2016-06-18 ENCOUNTER — Encounter: Payer: Self-pay | Admitting: Hematology

## 2016-06-18 VITALS — BP 154/61 | HR 59 | Temp 98.3°F | Resp 18 | Ht 61.0 in | Wt 96.5 lb

## 2016-06-18 DIAGNOSIS — D473 Essential (hemorrhagic) thrombocythemia: Secondary | ICD-10-CM | POA: Diagnosis not present

## 2016-06-18 DIAGNOSIS — F0390 Unspecified dementia without behavioral disturbance: Secondary | ICD-10-CM | POA: Diagnosis not present

## 2016-06-18 DIAGNOSIS — R634 Abnormal weight loss: Secondary | ICD-10-CM | POA: Diagnosis not present

## 2016-06-18 DIAGNOSIS — R630 Anorexia: Secondary | ICD-10-CM | POA: Diagnosis not present

## 2016-06-18 DIAGNOSIS — D649 Anemia, unspecified: Secondary | ICD-10-CM

## 2016-06-18 LAB — COMPREHENSIVE METABOLIC PANEL
ALK PHOS: 73 U/L (ref 40–150)
ALT: <9 U/L (ref 0–55)
ANION GAP: 9 meq/L (ref 3–11)
AST: 17 U/L (ref 5–34)
Albumin: 3.5 g/dL (ref 3.5–5.0)
BUN: 26.2 mg/dL — ABNORMAL HIGH (ref 7.0–26.0)
CALCIUM: 9.1 mg/dL (ref 8.4–10.4)
CHLORIDE: 108 meq/L (ref 98–109)
CO2: 27 mEq/L (ref 22–29)
Creatinine: 1.1 mg/dL (ref 0.6–1.1)
EGFR: 45 mL/min/{1.73_m2} — ABNORMAL LOW (ref >90)
Glucose: 87 mg/dl (ref 70–140)
POTASSIUM: 4.1 meq/L (ref 3.5–5.1)
Sodium: 144 mEq/L (ref 136–145)
Total Bilirubin: 0.34 mg/dL (ref 0.20–1.20)
Total Protein: 6.8 g/dL (ref 6.4–8.3)

## 2016-06-18 LAB — CBC & DIFF AND RETIC
BASO%: 0.4 % (ref 0.0–2.0)
Basophils Absolute: 0 10*3/uL (ref 0.0–0.1)
EOS%: 1.5 % (ref 0.0–7.0)
Eosinophils Absolute: 0.1 10*3/uL (ref 0.0–0.5)
HEMATOCRIT: 31.5 % — AB (ref 34.8–46.6)
HGB: 9.9 g/dL — ABNORMAL LOW (ref 11.6–15.9)
Immature Retic Fract: 22.9 % — ABNORMAL HIGH (ref 1.60–10.00)
LYMPH#: 1.1 10*3/uL (ref 0.9–3.3)
LYMPH%: 20.4 % (ref 14.0–49.7)
MCH: 29 pg (ref 25.1–34.0)
MCHC: 31.4 g/dL — AB (ref 31.5–36.0)
MCV: 92.4 fL (ref 79.5–101.0)
MONO#: 0.5 10*3/uL (ref 0.1–0.9)
MONO%: 9.2 % (ref 0.0–14.0)
NEUT#: 3.7 10*3/uL (ref 1.5–6.5)
NEUT%: 68.5 % (ref 38.4–76.8)
PLATELETS: 491 10*3/uL — AB (ref 145–400)
RBC: 3.41 10*6/uL — ABNORMAL LOW (ref 3.70–5.45)
RDW: 20.3 % — ABNORMAL HIGH (ref 11.2–14.5)
RETIC %: 1.57 % (ref 0.70–2.10)
Retic Ct Abs: 53.54 10*3/uL (ref 33.70–90.70)
WBC: 5.4 10*3/uL (ref 3.9–10.3)
nRBC: 2 % — ABNORMAL HIGH (ref 0–0)

## 2016-06-18 NOTE — Addendum Note (Signed)
Addended by: Elray Buba LE on: 06/18/2016 03:03 PM   Modules accepted: Orders

## 2016-06-18 NOTE — Progress Notes (Signed)
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  Principle Diagnosis: Essential thrombocytosis, intitiated on Hydrea on 07/05/2011; JAK2 mutation (-), BCR-ABL(-)   Previous therapy:  1. Initially the patient was on Hydrea 1500 mg QOD and 1000 mg QOD, with significant drop in platelet count to 60,000 and neutropenia, hydrea was decreased, dose uncertain due to pt's dementia and memory issue since 11/07/2015, held on 12/26/2015 2. Anagrelide 0.5 mg twice daily started on 12/26/2015, dose increased to 2mg  bid on 02/17/2016  Current therapy:  Anagrelide 2 mg twice daily,  Aspirin 81 mg for thromboprophylaxis.  Interim History:  Ms. [name] [name] returns to the office for follow-up. She is accompanied by her son to the clinic today. She is doing well overall, denies any new symptoms. She denies any recent fall, bruises or bleeding. Her appetite is moderate to low, she eats 3 meals. Her weight is stable overall. No other new complaints.  Medications: I have reviewed the patient's current medications.  Current Outpatient Prescriptions  Medication Sig Dispense Refill  . anagrelide (AGRYLIN) 1 MG capsule Take 1 mg by mouth. Take 2 tablets twice daily    . aspirin 81 MG tablet Take 81 mg by mouth daily.    Marland Kitchen atenolol (TENORMIN) 50 MG tablet Take 25 mg by mouth 2 (two) times daily.     . Calcium Carbonate-Vitamin D3 (CALCIUM 600/VITAMIN D) 600-400 MG-UNIT TABS Take 1 tablet by mouth 2 (two) times daily with a meal.    . donepezil (ARICEPT) 10 MG tablet Take one tablet at bedtime for memory    . dorzolamide-timolol (COSOPT) 22.3-6.8 MG/ML ophthalmic solution Place 1 drop into both eyes 2 (two) times daily. 2% - 0.5%.    . feeding supplement (BOOST / RESOURCE BREEZE) LIQD Take 1 Container by mouth 2 (two) times daily between meals.    . flurbiprofen (ANSAID) 100 MG tablet TAKE 1 TABLET BY  MOUTH EVERY DAY 90 tablet 2  . latanoprost (XALATAN) 0.005 % ophthalmic solution Place 1 drop into both eyes daily.    . Magnesium 250 MG TABS Take 1 tablet by mouth daily.     . memantine (NAMENDA) 10 MG tablet One twice daily to help preserve memory 60 tablet 5  . URELLE (URELLE/URISED) 81 MG TABS Take 1 tablet by mouth at bedtime as needed.      No current facility-administered medications for this visit.    Allergies:  Allergies  Allergen Reactions  . Darvocet [Propoxyphene N-Acetaminophen] Nausea And Vomiting  . Darvon Nausea And Vomiting  . Meperidine And Related Nausea And Vomiting  . Vicodin [Hydrocodone-Acetaminophen] Nausea And Vomiting  . Demerol [Meperidine]    Past Medical History, Surgical history, Social history, and Family History were reviewed and updated.  Review of Systems: Constitutional:  Negative for fever, chills, night sweats, anorexia, weight loss, pain. Cardiovascular: no chest pain or dyspnea on exertion Respiratory: no cough, shortness of breath, or wheezing Neurological: no TIA or stroke symptoms, (+) memory loss Dermatological: negative for rash ENT: negative for - epistaxis or headaches Skin: Negative. Gastrointestinal: no abdominal pain, change in bowel habits, or black or bloody stools Genito-Urinary: no dysuria, trouble voiding, or hematuria Hematological and Lymphatic: negative for - bleeding problems or blood clots Breast: negative for breast lumps Musculoskeletal: negative for - gait disturbance Remaining ROS negative.  Physical Exam: Blood pressure (!) 154/61, pulse (!) 59, temperature 98.3 F (36.8 C), temperature  source Oral, resp. rate 18, height 5\' 1"  (1.549 m), weight 96 lb 8 oz (43.8 kg), SpO2 99 %. ECOG: 0 General appearance: alert, cooperative, appears stated age and no distress Head: Normocephalic, without obvious abnormality, atraumatic Neck: no adenopathy, supple, symmetrical, trachea midline and thyroid not enlarged, symmetric,  no tenderness/mass/nodules HEENT: OP clear; PERRL; EOMi.  Lymph nodes: Cervical, supraclavicular, and axillary nodes normal. Heart:regular rate and rhythm, S1, S2 normal, no murmur, click, rub or gallop Lung:chest clear, no wheezing, rales, normal symmetric air entry Abdomin: soft, non-tender, without masses or organomegaly EXT:No peripheral edema Skin: No bruises or skin rashes  Lab Results: CBC Latest Ref Rng & Units 06/18/2016 06/11/2016 05/21/2016  WBC 3.9 - 10.3 10e3/uL 5.4 5.0 5.3  Hemoglobin 11.6 - 15.9 g/dL 9.9(L) 9.3(A) 8.9(A)  Hematocrit 34.8 - 46.6 % 31.5(L) 29(A) 28(A)  Platelets 145 - 400 10e3/uL 491(H) 509(A) 501(A)    CMP Latest Ref Rng & Units 06/18/2016 06/11/2016 03/24/2016  Glucose 70 - 140 mg/dl 87 - 94  BUN 7.0 - 26.0 mg/dL 26.2(H) 26(A) 21.0  Creatinine 0.6 - 1.1 mg/dL 1.1 1.0 0.9  Sodium 136 - 145 mEq/L 144 142 142  Potassium 3.5 - 5.1 mEq/L 4.1 4.8 4.3  Chloride 101 - 111 mmol/L - - -  CO2 22 - 29 mEq/L 27 - 27  Calcium 8.4 - 10.4 mg/dL 9.1 - 8.8  Total Protein 6.4 - 8.3 g/dL 6.8 - 6.3(L)  Total Bilirubin 0.20 - 1.20 mg/dL 0.34 - 0.30  Alkaline Phos 40 - 150 U/L 73 63 82  AST 5 - 34 U/L 17 15 16   ALT 0 - 55 U/L <9 7 <9     Impression and Plan: 82 year old Caucasian female  1. Essential thrombocythemia -We reviewed the natural course of essential thrombocythemia with patient and her family members, and the risk of major combination of thrombosis. -Her plt was around 1100K in early 2017, I have gradually increased her anagrelide dose to 2 mg twice daily  -Her platelet count has gradually improved, around 500K most of time. I will increase anagrelide from 2 mg twice daily to 3 mg in the morning, and 2 mg in the evening.  -Repeat CBC [facility], and fax report to [facility], [facility] -She'll continue aspirin 81 mg daily  2. Dementia -[facility]  -She'll follow-up with her primary  care physician Dr. Nyoka Cowden  -I discussed her with Dr. Nyoka Cowden about the interaction of Aricept and anagrelide, which can cause QT prolongation. He is being monitored. She is on low-dose of Aricept.  3. Anorexia and weight loss -She has been taking some protein shake, gained some weight lately. -I encouraged her to continue nutrition supplement.  4. Anemia -secondary to hydrea -will continue monitoring, no need for blood transfusion  5. She will continue follow up with her PCP for other medical issues.    Plan:  -increase anagrelide to 3 mg in the morning and 2 mg in the evening   -[facility], [facility].  -I will copy my note to her PCP Dr. Hervey Ard - I'll see her back in 3 months   Truitt Merle  06/18/2016

## 2016-06-25 DIAGNOSIS — F4323 Adjustment disorder with mixed anxiety and depressed mood: Secondary | ICD-10-CM | POA: Diagnosis not present

## 2016-07-02 ENCOUNTER — Other Ambulatory Visit: Payer: Self-pay | Admitting: *Deleted

## 2016-07-02 DIAGNOSIS — I10 Essential (primary) hypertension: Secondary | ICD-10-CM | POA: Diagnosis not present

## 2016-07-02 LAB — CBC AND DIFFERENTIAL
HCT: 30 % — AB (ref 36–46)
Hemoglobin: 9.6 g/dL — AB (ref 12.0–16.0)
Platelets: 468 10*3/uL — AB (ref 150–399)
WBC: 6.1 10^3/mL

## 2016-07-08 ENCOUNTER — Encounter: Payer: Self-pay | Admitting: Nurse Practitioner

## 2016-07-08 ENCOUNTER — Non-Acute Institutional Stay: Payer: Medicare Other | Admitting: Nurse Practitioner

## 2016-07-08 DIAGNOSIS — D473 Essential (hemorrhagic) thrombocythemia: Secondary | ICD-10-CM

## 2016-07-08 DIAGNOSIS — F0390 Unspecified dementia without behavioral disturbance: Secondary | ICD-10-CM

## 2016-07-08 DIAGNOSIS — D500 Iron deficiency anemia secondary to blood loss (chronic): Secondary | ICD-10-CM

## 2016-07-08 DIAGNOSIS — R413 Other amnesia: Secondary | ICD-10-CM | POA: Diagnosis not present

## 2016-07-08 DIAGNOSIS — F4323 Adjustment disorder with mixed anxiety and depressed mood: Secondary | ICD-10-CM

## 2016-07-08 DIAGNOSIS — I10 Essential (primary) hypertension: Secondary | ICD-10-CM | POA: Diagnosis not present

## 2016-07-08 NOTE — Progress Notes (Signed)
Location:   [facility] [place] Room Number: [place] of Service:  SNF (31) Provider:  Seward Coran, Manxie  NP   [name], MD  Patient Care Team: Estill Dooms, MD as PCP - General (Internal Medicine) [place] Desare Duddy Otho Darner, NP as Nurse Practitioner (Nurse Practitioner) Truitt Merle, MD as Consulting Physician (Hematology)  Extended Emergency Contact Information Primary Emergency Contact: [name] [place] of [place] Phone: [phone] Relation: Friend  Code Status:  DNR Goals of care: Advanced Directive information Advanced Directives 07/08/2016  Does patient have an advance directive? Yes  Type of Paramedic of [place];Living will  Does patient want to make changes to advanced directive? No - Patient declined  Copy of advanced directive(s) in chart? Yes  Would patient like information on creating an advanced directive? -     Chief Complaint  Patient presents with  . Acute Visit    episodes of weakness, had to have help while in dinning room on (8/24)    HPI:  Pt is a 82 y.o. female seen today for an acute visit for resolved episode of weakness after Sertraline held then subsequently discontinued. No focal neurological symptoms noted upon today's visit. The patient c/o feeling tired with little appetite.    Hx of HTN, controlled on Atenolol 25mg  bid, essential thrombocythemia, takes Agrylin 0.5mg  bid,  plt 217 01/14/16, anemia, Hgb 9.6 01/31/16,  memory, short terms mostly, talking Namenda, Aricept,  resides in AL for care need Past Medical History:  Diagnosis Date  . Abdominal pain, unspecified site 08/11/2012  . Anemia, unspecified 06/18/2011  . Arthritis   . BBB (bundle branch block) 03/03/2006  . Cardiac dysrhythmia, unspecified 03/03/2006  . Chronic interstitial cystitis 03/03/2006  . 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 Disorder of bone and cartilage, unspecified 04/20/2007  . Diverticulosis of colon (without  mention of hemorrhage) 04/20/2007  . Essential thrombocythemia ([place]) 06/18/2011  . Glaucoma   . Hematuria, unspecified 04/22/2004  . Hemorrhage of gastrointestinal tract, unspecified 03/09/2007  . Herpes zoster without mention of complication [id number]  . Hypertension   . 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 Loss of weight 06/18/2011  . Lumbago 12/18/2010  . Memory loss 12/19/2009  . Neck fracture ([place])   . Osteoarthrosis, unspecified whether generalized or localized, unspecified site 03/03/2006  . Other and unspecified hyperlipidemia 09/01/2006  . Sciatica 06/19/2010   Past Surgical History:  Procedure Laterality Date  . ABDOMINAL HYSTERECTOMY    . APPENDECTOMY    . BREAST CYST EXCISION Bilateral    benign  . CATARACT EXTRACTION W/ INTRAOCULAR LENS  IMPLANT, BILATERAL    . TONSILLECTOMY      Allergies  Allergen Reactions  . Darvocet [Propoxyphene N-Acetaminophen] Nausea And Vomiting  . Darvon Nausea And Vomiting  . Meperidine And Related Nausea And Vomiting  . Vicodin [Hydrocodone-Acetaminophen] Nausea And Vomiting  . Demerol [Meperidine]       Medication List       Accurate as of 07/08/16 11:59 PM. Always use your most recent med list.          anagrelide 1 MG capsule Commonly known as:  AGRYLIN Take 1 mg by mouth as directed. Take 3 mg in  AM  ;  And continue  2 mg in  PM.  As of  06/18/16.   aspirin 81 MG tablet Take 81 mg by mouth daily.   atenolol 25 MG tablet Commonly known as:  TENORMIN Take 25 mg by  mouth 2 (two) times daily.   calcitonin (salmon) 200 UNIT/ACT nasal spray Commonly known as:  MIACALCIN/FORTICAL Place 1 spray into alternate nostrils daily.   CALCIUM 600/VITAMIN D 600-400 MG-UNIT Tabs Generic drug:  Calcium Carbonate-Vitamin D3 Take 1 tablet by mouth 2 (two) times daily with a meal.   donepezil 10 MG tablet Commonly known as:  ARICEPT Take one tablet at bedtime for memory   dorzolamide-timolol 22.3-6.8 MG/ML ophthalmic solution Commonly  known as:  COSOPT Place 1 drop into both eyes 2 (two) times daily. 2% - 0.5%.   feeding supplement Liqd Take 90 mLs by mouth 2 (two) times daily between meals.   flurbiprofen 100 MG tablet Commonly known as:  ANSAID TAKE 1 TABLET BY MOUTH EVERY DAY   LORazepam 1 MG tablet Commonly known as:  ATIVAN Take 0.25 mg by mouth as needed for sleep.   Magnesium 250 MG Tabs Take 1 tablet by mouth daily.   memantine 10 MG tablet Commonly known as:  NAMENDA One twice daily to help preserve memory   URELLE 81 MG Tabs tablet Take 1 tablet by mouth at bedtime as needed.   vitamin E 400 UNIT capsule Take 400 Units by mouth daily.       Review of Systems  Constitutional: Negative for activity change, appetite change, chills, diaphoresis, fatigue, fever and unexpected weight change.  HENT: Positive for voice change (hoarse since September 2014). Negative for congestion, ear discharge, ear pain, hearing loss, postnasal drip, rhinorrhea, sore throat, tinnitus and trouble swallowing.   Eyes: Negative.  Negative for pain, redness, itching and visual disturbance.  Respiratory: Negative.  Negative for cough, choking, shortness of breath and wheezing.   Cardiovascular: Negative.  Negative for chest pain, palpitations and leg swelling.  Gastrointestinal: Negative.  Negative for abdominal distention, abdominal pain, constipation, diarrhea and nausea.  Endocrine: Negative.  Negative for cold intolerance, heat intolerance, polydipsia, polyphagia and polyuria.  Genitourinary: Negative.  Negative for difficulty urinating, dysuria, flank pain, frequency, hematuria, pelvic pain, urgency and vaginal discharge.  Musculoskeletal: Negative.  Negative for arthralgias, back pain, gait problem, myalgias, neck pain and neck stiffness.  Skin: Positive for wound. Negative for color change, pallor and rash.  Allergic/Immunologic: Negative.   Neurological: Negative for dizziness, tremors, seizures, syncope, weakness,  numbness and headaches.       Memory loss  Hematological: Negative for adenopathy. Does not bruise/bleed easily.       Thrombocythemia  Psychiatric/Behavioral: Negative.  Negative for agitation, behavioral problems, confusion, dysphoric mood, hallucinations, sleep disturbance and suicidal ideas. The patient is not nervous/anxious and is not hyperactive.     Immunization History  Administered Date(s) Administered  . Influenza Whole 08/09/2012, 07/10/2013  . Influenza-Unspecified 08/14/2014, 08/08/2015  . Pneumococcal Polysaccharide-23 11/09/2004  . Zoster 10/07/2006   Pertinent  Health Maintenance Due  Topic Date Due  . PNA vac Low Risk Adult (2 of 2 - PCV13) 11/09/2005  . INFLUENZA VACCINE  06/09/2016  . DEXA SCAN  Completed   Fall Risk  02/27/2016 12/19/2015 08/22/2015 02/21/2015 09/07/2014  Falls in the past year? No No No No No   Functional Status Survey:    Vitals:   07/08/16 1312  BP: (!) 150/80  Pulse: 78  Resp: 20  Temp: 97.8 F (36.6 C)  Weight: 96 lb 8 oz (43.8 kg)  Height: 5\' 1"  (1.549 m)   Body mass index is 18.23 kg/m. Physical Exam  Constitutional: She is oriented to person, place, and time. She appears well-developed and well-nourished. No  distress.  HENT:  Head: Normocephalic and atraumatic.  Right Ear: External ear normal.  Left Ear: External ear normal.  Nose: Nose normal.  Mouth/Throat: Oropharynx is clear and moist.  Eyes: Conjunctivae and EOM are normal. Pupils are equal, round, and reactive to light.  Corrective lenses  Neck: Neck supple. No JVD present. No tracheal deviation present. No thyromegaly present.  Cardiovascular: Normal rate and regular rhythm.  Exam reveals no gallop and no friction rub.   Murmur (2/6/LSB SEM) heard. Pulmonary/Chest: No respiratory distress. She has no wheezes. She has no rales.  Abdominal: She exhibits no distension and no mass. There is no tenderness.  Musculoskeletal: She exhibits no edema or tenderness.    Heberden's nodes. Bouchard's nodes. Contracture of the right hand.  Lymphadenopathy:    She has no cervical adenopathy.  Neurological: She is alert and oriented to person, place, and time. She has normal reflexes. No cranial nerve deficit. Coordination normal.  08/22/2015 MMSE 28/30. Failed clock drawing.  Skin: No rash noted. No erythema. No pallor.  Left forehead traumatic laceration, sutures intact, no s/s of infection.   Psychiatric: She has a normal mood and affect. Her behavior is normal. Judgment and thought content normal.    Labs reviewed:  Recent Labs  12/12/15 1732  01/08/16 0917 03/24/16 0838 06/11/16 06/18/16 1013  NA 143  < > 140 142 142 144  K 3.6  < > 3.8 4.3 4.8 4.1  CL 108  --  107  --   --   --   CO2 23  < > 24 27  --  27  GLUCOSE 158*  < > 105* 94  --  87  BUN 26*  < > 21* 21.0 26* 26.2*  CREATININE 1.10*  < > 1.08* 0.9 1.0 1.1  CALCIUM 8.8*  < > 8.5* 8.8  --  9.1  < > = values in this interval not displayed.  Recent Labs  01/08/16 0917 03/24/16 0838 06/11/16 06/18/16 1013  AST 26 16 15 17   ALT 14 <9 7 <9  ALKPHOS 47 82 63 73  BILITOT 0.6 0.30  --  0.34  PROT 6.2* 6.3*  --  6.8  ALBUMIN 3.4* 3.2*  --  3.5    Recent Labs  02/17/16 0859  03/24/16 0838  06/11/16 06/18/16 1013 07/02/16  WBC 7.6  < > 4.8  < > 5.0 5.4 6.1  NEUTROABS 5.5  --  3.2  --   --  3.7  --   HGB 11.1*  < > 9.8*  < > 9.3* 9.9* 9.6*  HCT 35.7  < > 30.9*  < > 29* 31.5* 30*  MCV 103.5*  --  94.5  --   --  92.4  --   PLT 981*  < > 502*  < > 509* 491* 468*  < > = values in this interval not displayed. No results found for: TSH No results found for: HGBA1C Lab Results  Component Value Date   CHOL 178 02/12/2015   HDL 77 (A) 02/12/2015   LDLCALC 83 02/12/2015   TRIG 92 02/12/2015    Significant Diagnostic Results in last 30 days:  No results found.  Assessment/Plan There are no diagnoses linked to this encounter.Hypertension Controlled, continue Atenolol 25mg  bid.  06/11/16 Hgb 9.3, Na 142, K 4.8, Bun 26, creat 1.02    Dementia Continue Namenda, Aricept   Essential thrombocythemia ([facility]CC) 06/11/16 Hgb 9.3, Na 142, K 4.8, Bun 26, creat 1.02  02/25/16 Hgb 11.3, Plt  802, continue Hydroxyurea 500mg  II M-F, I sat and sun. F/u oncology.    Anemia 06/11/16 Hgb 9.3, Na 142, K 4.8, Bun 26, creat 1.02, f/u oncology, may consider Omeprazole 20mg  daily x 6 weeks in setting of Flurbiprofen 100mg  daily 07/02/16 Hgb 9.6   Memory loss 12/16/15 MMSE 24/30, 25/30, continue AL for care needs. Supervision for safety 02/20/16 MMSE 24/30. Failed clock drawing Continue Aricept,  Adjustment disorder with mixed anxiety and depressed mood Stable. Continue observe the patient.  Off Sertraline due to weakness C/o feeling no energy, poor appetite, will try Mirtazapine 7.5mg  daily in setting of weigh loss     Family/ staff Communication: continue AL for care assistance.   Labs/tests ordered:  none

## 2016-07-08 NOTE — Assessment & Plan Note (Signed)
06/11/16 Hgb 9.3, Na 142, K 4.8, Bun 26, creat 1.02, f/u oncology, may consider Omeprazole 20mg  daily x 6 weeks in setting of Flurbiprofen 100mg  daily 07/02/16 Hgb 9.6

## 2016-07-08 NOTE — Assessment & Plan Note (Addendum)
Stable. Continue observe the patient.  Off Sertraline due to weakness C/o feeling no energy, poor appetite, will try Mirtazapine 7.5mg  daily in setting of weigh loss

## 2016-07-08 NOTE — Assessment & Plan Note (Signed)
Controlled, continue Atenolol 25mg  bid. 06/11/16 Hgb 9.3, Na 142, K 4.8, Bun 26, creat 1.02

## 2016-07-08 NOTE — Assessment & Plan Note (Signed)
Continue Namenda, Aricept

## 2016-07-08 NOTE — Assessment & Plan Note (Signed)
06/11/16 Hgb 9.3, Na 142, K 4.8, Bun 26, creat 1.02  02/25/16 Hgb 11.3, Plt 802, continue Hydroxyurea 500mg  II M-F, I sat and sun. F/u oncology.

## 2016-07-08 NOTE — Assessment & Plan Note (Signed)
12/16/15 MMSE 24/30, 25/30, continue AL for care needs. Supervision for safety 02/20/16 MMSE 24/30. Failed clock drawing Continue Aricept,

## 2016-07-09 DIAGNOSIS — D649 Anemia, unspecified: Secondary | ICD-10-CM | POA: Diagnosis not present

## 2016-07-09 LAB — CBC AND DIFFERENTIAL
HCT: 31 % — AB (ref 36–46)
HEMOGLOBIN: 10 g/dL — AB (ref 12.0–16.0)
Platelets: 398 10*3/uL (ref 150–399)
WBC: 4.8 10^3/mL

## 2016-07-14 LAB — BASIC METABOLIC PANEL
BUN: 31 mg/dL — AB (ref 4–21)
Creatinine: 1 mg/dL (ref <=1.1)
Potassium: 4.4 mmol/L (ref 3.4–5.3)
SODIUM: 142 mmol/L (ref 137–147)

## 2016-07-14 LAB — LIPID PANEL
Cholesterol: 174 mg/dL (ref 0–200)
HDL: 59 mg/dL (ref 35–70)
LDL CALC: 92 mg/dL
TRIGLYCERIDES: 117 mg/dL (ref 40–160)

## 2016-07-14 LAB — HEPATIC FUNCTION PANEL
ALT: 5 U/L — AB (ref 7–35)
AST: 12 U/L — AB (ref 13–35)
Alkaline Phosphatase: 51 U/L (ref 25–125)
BILIRUBIN, TOTAL: 0.3 mg/dL

## 2016-07-17 ENCOUNTER — Other Ambulatory Visit: Payer: Self-pay | Admitting: *Deleted

## 2016-07-24 ENCOUNTER — Telehealth: Payer: Self-pay | Admitting: *Deleted

## 2016-07-24 NOTE — Telephone Encounter (Signed)
Received call from [place] asking if we had received labs from 07/10/16-CBC.  Informed that this nurse did not remember seeing these labs but noted labs in chart from 07/14/16.  These were reviewed by Dr Burr Medico & no changes made to anagrelide.  Cont 1 mg in am & 2 mg in pm.  Verified with John at home & she does have labs ordered for next month.

## 2016-08-05 ENCOUNTER — Non-Acute Institutional Stay: Payer: Medicare Other | Admitting: Nurse Practitioner

## 2016-08-05 ENCOUNTER — Encounter: Payer: Self-pay | Admitting: Nurse Practitioner

## 2016-08-05 DIAGNOSIS — R413 Other amnesia: Secondary | ICD-10-CM

## 2016-08-05 DIAGNOSIS — D649 Anemia, unspecified: Secondary | ICD-10-CM

## 2016-08-05 DIAGNOSIS — F0390 Unspecified dementia without behavioral disturbance: Secondary | ICD-10-CM

## 2016-08-05 DIAGNOSIS — I10 Essential (primary) hypertension: Secondary | ICD-10-CM

## 2016-08-05 DIAGNOSIS — F4323 Adjustment disorder with mixed anxiety and depressed mood: Secondary | ICD-10-CM | POA: Diagnosis not present

## 2016-08-05 DIAGNOSIS — D473 Essential (hemorrhagic) thrombocythemia: Secondary | ICD-10-CM

## 2016-08-05 NOTE — Assessment & Plan Note (Signed)
Continue Agrylin 0.5mg  bid, last Plt 291 07/30/16

## 2016-08-05 NOTE — Assessment & Plan Note (Signed)
12/16/15 MMSE 24/30, 25/30, continue AL for care needs. Supervision for safety 02/20/16 MMSE 24/30. Failed clock drawing Continue Aricept,

## 2016-08-05 NOTE — Assessment & Plan Note (Signed)
Stable. Continue observe the patient.  Off Sertraline due to weakness C/o feeling no energy, poor appetite, better with  Mirtazapine 7.5mg  daily in setting of weigh loss

## 2016-08-05 NOTE — Assessment & Plan Note (Signed)
06/11/16 Hgb 9.3, Na 142, K 4.8, Bun 26, creat 1.02, f/u oncology, may consider Omeprazole 20mg  daily x 6 weeks in setting of Flurbiprofen 100mg  daily 07/02/16 Hgb 9.6 07/14/16 Hgb 8.9

## 2016-08-05 NOTE — Assessment & Plan Note (Signed)
Continue Namenda, Aricept

## 2016-08-05 NOTE — Assessment & Plan Note (Addendum)
Controlled, continue Atenolol 25mg  bid. 07/14/16 Hgb 8.9, plt 347.  Na 142, K 4.4, Bun 31, creat 1.02, TP 5.2, albumin 3.2, LDL 92

## 2016-08-05 NOTE — Progress Notes (Signed)
Location:  [place] Room Number: [id number] Place of Service:  ALF [phone]) Provider:  Maritssa Haughton, Manxie  NP  [name], MD  Patient Care Team: Estill Dooms, MD as PCP - General (Internal Medicine) Rotonda Ka Bench Otho Darner, NP as Nurse Practitioner (Nurse Practitioner) Truitt Merle, MD as Consulting Physician (Hematology)  Extended Emergency Contact Information Primary Emergency Contact: [name] States of [place] Phone: [phone] Relation: Friend  Code Status:  DNR Goals of care: Advanced Directive information Advanced Directives 08/05/2016  Does patient have an advance directive? Yes  Type of Paramedic of [place];Living will  Does patient want to make changes to advanced directive? No - Patient declined  Copy of advanced directive(s) in chart? Yes  Would patient like information on creating an advanced directive? -     Chief Complaint  Patient presents with  . Medical Management of Chronic Issues    HPI:  Pt is a 82 y.o. female seen today for medical management of chronic diseases.     Hx of HTN, controlled on Atenolol 25mg  bid, essential thrombocythemia, takes Agrylin 0.5mg  bid,  plt 347 07/14/16 anemia Hgb 8.9 07/14/16,  memory, short terms mostly, talking Namenda, Aricept, resides in AL for care need, mood and weight are  managed with Mirtazapine 7.5mg  Past Medical History:  Diagnosis Date  . Abdominal pain, unspecified site 08/11/2012  . Anemia, unspecified 06/18/2011  . Arthritis   . BBB (bundle branch block) 03/03/2006  . Cardiac dysrhythmia, unspecified 03/03/2006  . Chronic interstitial cystitis 03/03/2006  . 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 Disorder of bone and cartilage, unspecified 04/20/2007  . Diverticulosis of colon (without mention of hemorrhage) 04/20/2007  . Essential thrombocythemia ([place]) 06/18/2011  . Glaucoma   . Hematuria, unspecified 04/22/2004  . Hemorrhage of gastrointestinal tract,  unspecified 03/09/2007  . Herpes zoster without mention of complication [id number]  . Hypertension   . 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 Loss of weight 06/18/2011  . Lumbago 12/18/2010  . Memory loss 12/19/2009  . Neck fracture ([place])   . Osteoarthrosis, unspecified whether generalized or localized, unspecified site 03/03/2006  . Other and unspecified hyperlipidemia 09/01/2006  . Sciatica 06/19/2010   Past Surgical History:  Procedure Laterality Date  . ABDOMINAL HYSTERECTOMY    . APPENDECTOMY    . BREAST CYST EXCISION Bilateral    benign  . CATARACT EXTRACTION W/ INTRAOCULAR LENS  IMPLANT, BILATERAL    . TONSILLECTOMY      Allergies  Allergen Reactions  . Darvocet [Propoxyphene N-Acetaminophen] Nausea And Vomiting  . Darvon Nausea And Vomiting  . Meperidine And Related Nausea And Vomiting  . Vicodin [Hydrocodone-Acetaminophen] Nausea And Vomiting  . Demerol [Meperidine]       Medication List       Accurate as of 08/05/16  3:31 PM. Always use your most recent med list.          anagrelide 1 MG capsule Commonly known as:  AGRYLIN Take 1 mg by mouth as directed. Take 3 mg in  AM  ;  And continue  2 mg in  PM.  As of  06/18/16.   aspirin 81 MG tablet Take 81 mg by mouth daily.   atenolol 25 MG tablet Commonly known as:  TENORMIN Take 25 mg by mouth 2 (two) times daily.   calcitonin (salmon) 200 UNIT/ACT nasal spray Commonly known as:  MIACALCIN/FORTICAL Place 1 spray into alternate nostrils daily.   CALCIUM 600/VITAMIN D 600-400  MG-UNIT Tabs Generic drug:  Calcium Carbonate-Vitamin D3 Take 1 tablet by mouth 2 (two) times daily with a meal.   donepezil 10 MG tablet Commonly known as:  ARICEPT Take one tablet at bedtime for memory   dorzolamide-timolol 22.3-6.8 MG/ML ophthalmic solution Commonly known as:  COSOPT Place 1 drop into both eyes 2 (two) times daily. 2% - 0.5%.   feeding supplement Liqd Take 90 mLs by mouth 2 (two) times daily between meals.     flurbiprofen 100 MG tablet Commonly known as:  ANSAID TAKE 1 TABLET BY MOUTH EVERY DAY   LORazepam 1 MG tablet Commonly known as:  ATIVAN Take 0.25 mg by mouth as needed for sleep.   Magnesium 250 MG Tabs Take 1 tablet by mouth daily.   memantine 10 MG tablet Commonly known as:  NAMENDA One twice daily to help preserve memory   URELLE 81 MG Tabs tablet Take 1 tablet by mouth at bedtime as needed.   vitamin E 400 UNIT capsule Take 400 Units by mouth daily.       Review of Systems  Constitutional: Negative for activity change, appetite change, chills, diaphoresis, fatigue, fever and unexpected weight change.  HENT: Positive for voice change (hoarse since September 2014). Negative for congestion, ear discharge, ear pain, hearing loss, postnasal drip, rhinorrhea, sore throat, tinnitus and trouble swallowing.   Eyes: Negative.  Negative for pain, redness, itching and visual disturbance.  Respiratory: Negative.  Negative for cough, choking, shortness of breath and wheezing.   Cardiovascular: Negative.  Negative for chest pain, palpitations and leg swelling.  Gastrointestinal: Negative.  Negative for abdominal distention, abdominal pain, constipation, diarrhea and nausea.  Endocrine: Negative.  Negative for cold intolerance, heat intolerance, polydipsia, polyphagia and polyuria.  Genitourinary: Negative.  Negative for difficulty urinating, dysuria, flank pain, frequency, hematuria, pelvic pain, urgency and vaginal discharge.  Musculoskeletal: Negative.  Negative for arthralgias, back pain, gait problem, myalgias, neck pain and neck stiffness.  Skin: Positive for wound. Negative for color change, pallor and rash.  Allergic/Immunologic: Negative.   Neurological: Negative for dizziness, tremors, seizures, syncope, weakness, numbness and headaches.       Memory loss  Hematological: Negative for adenopathy. Does not bruise/bleed easily.       Thrombocythemia  Psychiatric/Behavioral:  Negative.  Negative for agitation, behavioral problems, confusion, dysphoric mood, hallucinations, sleep disturbance and suicidal ideas. The patient is not nervous/anxious and is not hyperactive.     Immunization History  Administered Date(s) Administered  . Influenza Whole 08/09/2012, 07/10/2013  . Influenza-Unspecified 08/14/2014, 08/08/2015  . Pneumococcal Polysaccharide-23 11/09/2004  . Zoster 10/07/2006   Pertinent  Health Maintenance Due  Topic Date Due  . PNA vac Low Risk Adult (2 of 2 - PCV13) 11/09/2005  . INFLUENZA VACCINE  06/09/2016  . DEXA SCAN  Completed   Fall Risk  02/27/2016 12/19/2015 08/22/2015 02/21/2015 09/07/2014  Falls in the past year? No No No No No   Functional Status Survey:    Vitals:   08/05/16 1112  BP: (!) 144/68  Pulse: 68  Resp: 18  Temp: 98.9 F (37.2 C)  Weight: 97 lb (44 kg)  Height: 5\' 1"  (1.549 m)   Body mass index is 18.33 kg/m. Physical Exam  Constitutional: She is oriented to person, place, and time. She appears well-developed and well-nourished. No distress.  HENT:  Head: Normocephalic and atraumatic.  Right Ear: External ear normal.  Left Ear: External ear normal.  Nose: Nose normal.  Mouth/Throat: Oropharynx is clear and moist.  Eyes: Conjunctivae and EOM are normal. Pupils are equal, round, and reactive to light.  Corrective lenses  Neck: Neck supple. No JVD present. No tracheal deviation present. No thyromegaly present.  Cardiovascular: Normal rate and regular rhythm.  Exam reveals no gallop and no friction rub.   Murmur (2/6/LSB SEM) heard. Pulmonary/Chest: No respiratory distress. She has no wheezes. She has no rales.  Abdominal: She exhibits no distension and no mass. There is no tenderness.  Musculoskeletal: She exhibits no edema or tenderness.  Heberden's nodes. Bouchard's nodes. Contracture of the right hand.  Lymphadenopathy:    She has no cervical adenopathy.  Neurological: She is alert and oriented to person,  place, and time. She has normal reflexes. No cranial nerve deficit. Coordination normal.  08/22/2015 MMSE 28/30. Failed clock drawing.  Skin: No rash noted. No erythema. No pallor.  Left forehead traumatic laceration, sutures intact, no s/s of infection.   Psychiatric: She has a normal mood and affect. Her behavior is normal. Judgment and thought content normal.    Labs reviewed:  Recent Labs  12/12/15 1732  01/08/16 0917 03/24/16 0838 06/11/16 06/18/16 1013 07/14/16  NA 143  < > 140 142 142 144 142  K 3.6  < > 3.8 4.3 4.8 4.1 4.4  CL 108  --  107  --   --   --   --   CO2 23  < > 24 27  --  27  --   GLUCOSE 158*  < > 105* 94  --  87  --   BUN 26*  < > 21* 21.0 26* 26.2* 31*  CREATININE 1.10*  < > 1.08* 0.9 1.0 1.1 1.0  CALCIUM 8.8*  < > 8.5* 8.8  --  9.1  --   < > = values in this interval not displayed.  Recent Labs  01/08/16 0917 03/24/16 0838 06/11/16 06/18/16 1013 07/14/16  AST 26 16 15 17  12*  ALT 14 <9 7 <9 5*  ALKPHOS 47 82 63 73 51  BILITOT 0.6 0.30  --  0.34  --   PROT 6.2* 6.3*  --  6.8  --   ALBUMIN 3.4* 3.2*  --  3.5  --     Recent Labs  02/17/16 0859  03/24/16 0838  06/18/16 1013 07/02/16 07/09/16  WBC 7.6  < > 4.8  < > 5.4 6.1 4.8  NEUTROABS 5.5  --  3.2  --  3.7  --   --   HGB 11.1*  < > 9.8*  < > 9.9* 9.6* 10.0*  HCT 35.7  < > 30.9*  < > 31.5* 30* 31*  MCV 103.5*  --  94.5  --  92.4  --   --   PLT 981*  < > 502*  < > 491* 468* 398  < > = values in this interval not displayed. No results found for: TSH No results found for: HGBA1C Lab Results  Component Value Date   CHOL 174 07/14/2016   HDL 59 07/14/2016   LDLCALC 92 07/14/2016   TRIG 117 07/14/2016    Significant Diagnostic Results in last 30 days:  No results found.  Assessment/Plan Hypertension Controlled, continue Atenolol 25mg  bid. 07/14/16 Hgb 8.9, plt 347.  Na 142, K 4.4, Bun 31, creat 1.02, TP 5.2, albumin 3.2, LDL 92    Dementia Continue Namenda, Aricept   Essential  thrombocythemia (HCC) Continue Agrylin 0.5mg  bid, last Plt 291 07/30/16  Anemia 06/11/16 Hgb 9.3, Na 142, K 4.8, Bun  26, creat 1.02, f/u oncology, may consider Omeprazole 20mg  daily x 6 weeks in setting of Flurbiprofen 100mg  daily 07/02/16 Hgb 9.6 07/14/16 Hgb 8.9   Memory loss 12/16/15 MMSE 24/30, 25/30, continue AL for care needs. Supervision for safety 02/20/16 MMSE 24/30. Failed clock drawing Continue Aricept,  Adjustment disorder with mixed anxiety and depressed mood Stable. Continue observe the patient.  Off Sertraline due to weakness C/o feeling no energy, poor appetite, better with  Mirtazapine 7.5mg  daily in setting of weigh loss     Family/ staff Communication: continue AL for care assistance.   Labs/tests ordered:  none

## 2016-08-13 ENCOUNTER — Telehealth: Payer: Self-pay | Admitting: *Deleted

## 2016-08-13 DIAGNOSIS — I10 Essential (primary) hypertension: Secondary | ICD-10-CM | POA: Diagnosis not present

## 2016-08-13 NOTE — Telephone Encounter (Addendum)
Called recieved from [facility] from [place], call in regards to pt. she left a contact number be reached, [phone]

## 2016-08-18 ENCOUNTER — Telehealth: Payer: Self-pay | Admitting: Hematology

## 2016-08-18 NOTE — Telephone Encounter (Signed)
Returned call to RN at [facility] regarding the patient's next scheduled appointment. Left a message.

## 2016-08-20 DIAGNOSIS — Z23 Encounter for immunization: Secondary | ICD-10-CM | POA: Diagnosis not present

## 2016-08-27 DIAGNOSIS — I10 Essential (primary) hypertension: Secondary | ICD-10-CM | POA: Diagnosis not present

## 2016-08-31 ENCOUNTER — Other Ambulatory Visit: Payer: Medicare Other

## 2016-08-31 ENCOUNTER — Ambulatory Visit: Payer: Medicare Other | Admitting: Hematology

## 2016-09-01 ENCOUNTER — Telehealth: Payer: Self-pay | Admitting: Hematology

## 2016-09-01 ENCOUNTER — Other Ambulatory Visit: Payer: Medicare Other

## 2016-09-01 ENCOUNTER — Ambulatory Visit (HOSPITAL_BASED_OUTPATIENT_CLINIC_OR_DEPARTMENT_OTHER): Payer: Medicare Other | Admitting: Hematology

## 2016-09-01 ENCOUNTER — Other Ambulatory Visit (HOSPITAL_BASED_OUTPATIENT_CLINIC_OR_DEPARTMENT_OTHER): Payer: Medicare Other

## 2016-09-01 VITALS — BP 143/61 | HR 64 | Temp 97.5°F | Resp 16 | Ht 61.0 in | Wt 98.0 lb

## 2016-09-01 DIAGNOSIS — F0390 Unspecified dementia without behavioral disturbance: Secondary | ICD-10-CM | POA: Diagnosis not present

## 2016-09-01 DIAGNOSIS — D473 Essential (hemorrhagic) thrombocythemia: Secondary | ICD-10-CM

## 2016-09-01 DIAGNOSIS — G301 Alzheimer's disease with late onset: Secondary | ICD-10-CM

## 2016-09-01 DIAGNOSIS — R634 Abnormal weight loss: Secondary | ICD-10-CM

## 2016-09-01 DIAGNOSIS — D649 Anemia, unspecified: Secondary | ICD-10-CM

## 2016-09-01 DIAGNOSIS — R630 Anorexia: Secondary | ICD-10-CM | POA: Diagnosis not present

## 2016-09-01 DIAGNOSIS — F0280 Dementia in other diseases classified elsewhere without behavioral disturbance: Secondary | ICD-10-CM

## 2016-09-01 LAB — RETICULOCYTES (CHCC)
Immature Retic Fract: 20.8 % — ABNORMAL HIGH (ref 1.60–10.00)
RBC: 3.12 10*6/uL — AB (ref 3.70–5.45)
Retic %: 1.75 % (ref 0.70–2.10)
Retic Ct Abs: 54.6 10*3/uL (ref 33.70–90.70)

## 2016-09-01 LAB — CBC & DIFF AND RETIC
BASO%: 0.7 % (ref 0.0–2.0)
Basophils Absolute: 0 10*3/uL (ref 0.0–0.1)
EOS%: 1.1 % (ref 0.0–7.0)
Eosinophils Absolute: 0.1 10*3/uL (ref 0.0–0.5)
HCT: 29.5 % — ABNORMAL LOW (ref 34.8–46.6)
HGB: 9 g/dL — ABNORMAL LOW (ref 11.6–15.9)
Immature Retic Fract: 24.8 % — ABNORMAL HIGH (ref 1.60–10.00)
LYMPH#: 1.3 10*3/uL (ref 0.9–3.3)
LYMPH%: 22.4 % (ref 14.0–49.7)
MCH: 28.7 pg (ref 25.1–34.0)
MCHC: 30.5 g/dL — AB (ref 31.5–36.0)
MCV: 93.9 fL (ref 79.5–101.0)
MONO#: 0.6 10*3/uL (ref 0.1–0.9)
MONO%: 10.5 % (ref 0.0–14.0)
NEUT%: 65.3 % (ref 38.4–76.8)
NEUTROS ABS: 3.7 10*3/uL (ref 1.5–6.5)
PLATELETS: 253 10*3/uL (ref 145–400)
RBC: 3.14 10*6/uL — AB (ref 3.70–5.45)
RDW: 19.2 % — ABNORMAL HIGH (ref 11.2–14.5)
RETIC CT ABS: 59.03 10*3/uL (ref 33.70–90.70)
Retic %: 1.88 % (ref 0.70–2.10)
WBC: 5.6 10*3/uL (ref 3.9–10.3)
nRBC: 2 % — ABNORMAL HIGH (ref 0–0)

## 2016-09-01 LAB — MORPHOLOGY: PLT EST: ADEQUATE

## 2016-09-01 LAB — IRON AND TIBC
%SAT: 26 % (ref 21–57)
Iron: 87 ug/dL (ref 41–142)
TIBC: 338 ug/dL (ref 236–444)
UIBC: 251 ug/dL (ref 120–384)

## 2016-09-01 LAB — TECHNOLOGIST REVIEW: Technologist Review: 2

## 2016-09-01 LAB — LACTATE DEHYDROGENASE: LDH: 525 U/L — AB (ref 125–245)

## 2016-09-01 LAB — FERRITIN: Ferritin: 106 ng/ml (ref 9–269)

## 2016-09-01 NOTE — Progress Notes (Signed)
Hematology and Oncology Follow Up Visit Date of Visit: 09/01/16   [name] [name] [id number] 01-07-1923 82 y.o. [name] Hubert, MD  Chief Complaint: Essential Thrombocytosis.  Principle Diagnosis: Essential thrombocytosis, JAK2 mutation (-), BCR-ABL(-)   Previous therapy:  1. Initially the patient was on Hydrea 1500 mg QOD and 1000 mg QOD, with significant drop in platelet count to 60,000 and neutropenia, hydrea was decreased, dose uncertain due to pt's dementia and memory issue since 11/07/2015, held on 12/26/2015 2. Anagrelide 0.5 mg twice daily started on 12/26/2015, dose increased to 95m bid on 02/17/2016, further increased to 346mAM, 33m94mM on 06/19/2016  Current therapy:  Anagrelide 3mg61m AM and 2 mg in PM,  Aspirin 81 mg for thromboprophylaxis.  Interim History:  [name] [name] to the office for follow-up. She is accompanied by her daughter-in-law to the clinic today. Her routine lab showed worsening anemia, and she was brought back to clinic today. She feels well overall, had mild fatigue, able to ambulate in the hallway, and do her own ADLs. She denies any dyspnea on exertion or chest pain. She has been tolerating anagrelide well, denies any bruising or bleeding. Her appetite is still low overall, her weight is stable.  Medications: I have reviewed the patient's current medications.  Current Outpatient Prescriptions  Medication Sig Dispense Refill  . anagrelide (AGRYLIN) 1 MG capsule Take 1 mg by mouth as directed. Take 3 mg in  AM  ;  And continue  2 mg in  PM.  As of  06/18/16.    . asMarland Kitchenirin 81 MG tablet Take 81 mg by mouth daily.    . atMarland Kitchennolol (TENORMIN) 25 MG tablet Take 25 mg by mouth 2 (two) times daily.    . calcitonin, salmon, (MIACALCIN/FORTICAL) 200 UNIT/ACT nasal spray Place 1 spray into alternate nostrils daily.    . Calcium Carbonate-Vitamin D3 (CALCIUM 600/VITAMIN D) 600-400 MG-UNIT TABS Take 1 tablet by mouth 2 (two) times daily with a meal.    . donepezil (ARICEPT) 10  MG tablet Take one tablet at bedtime for memory    . dorzolamide-timolol (COSOPT) 22.3-6.8 MG/ML ophthalmic solution Place 1 drop into both eyes 2 (two) times daily. 2% - 0.5%.    . feeding supplement (BOOST / RESOURCE BREEZE) LIQD Take 90 mLs by mouth 2 (two) times daily between meals.     . flurbiprofen (ANSAID) 100 MG tablet TAKE 1 TABLET BY MOUTH EVERY DAY 90 tablet 2  . LORazepam (ATIVAN) 1 MG tablet Take 0.25 mg by mouth as needed for sleep.    . Magnesium 250 MG TABS Take 1 tablet by mouth daily.     . memantine (NAMENDA) 10 MG tablet One twice daily to help preserve memory 60 tablet 5  . URELLE (URELLE/URISED) 81 MG TABS Take 1 tablet by mouth at bedtime as needed.     . vitamin E 400 UNIT capsule Take 400 Units by mouth daily.     No current facility-administered medications for this visit.    Allergies:  Allergies  Allergen Reactions  . Darvocet [Propoxyphene N-Acetaminophen] Nausea And Vomiting  . Darvon Nausea And Vomiting  . Meperidine And Related Nausea And Vomiting  . Vicodin [Hydrocodone-Acetaminophen] Nausea And Vomiting  . Demerol [Meperidine]    Past Medical History, Surgical history, Social history, and Family History were reviewed and updated.  Review of Systems: Constitutional:  Negative for fever, chills, night sweats, anorexia, weight loss, pain. Cardiovascular: no chest pain or dyspnea on exertion Respiratory: no cough, shortness  of breath, or wheezing Neurological: no TIA or stroke symptoms, (+) memory loss Dermatological: negative for rash ENT: negative for - epistaxis or headaches Skin: Negative. Gastrointestinal: no abdominal pain, change in bowel habits, or black or bloody stools Genito-Urinary: no dysuria, trouble voiding, or hematuria Hematological and Lymphatic: negative for - bleeding problems or blood clots Breast: negative for breast lumps Musculoskeletal: negative for - gait disturbance Remaining ROS negative.  Physical Exam: There were no  vitals taken for this visit. ECOG: 2 General appearance: alert, cooperative, appears stated age and no distress Head: Normocephalic, without obvious abnormality, atraumatic Neck: no adenopathy, supple, symmetrical, trachea midline and thyroid not enlarged, symmetric, no tenderness/mass/nodules HEENT: OP clear; PERRL; EOMi.  Lymph nodes: Cervical, supraclavicular, and axillary nodes normal. Heart:regular rate and rhythm, S1, S2 normal, no murmur, click, rub or gallop Lung:chest clear, no wheezing, rales, normal symmetric air entry Abdomin: soft, non-tender, without masses or organomegaly EXT:No peripheral edema Skin: No bruises or skin rashes  Lab Results: CBC Latest Ref Rng & Units 09/01/2016 09/01/2016 07/09/2016  WBC 3.9 - 10.3 10e3/uL 5.6 - 4.8  Hemoglobin 11.6 - 15.9 g/dL 9.0(L) - 10.0(A)  Hematocrit 34.0 - 46.6 % 26.9(L) 29.5(L) 31(A)  Platelets 145 - 400 10e3/uL 253 - 398    CMP Latest Ref Rng & Units 09/01/2016 07/14/2016 06/18/2016  Glucose 70 - 140 mg/dl - - 87  BUN 4 - 21 mg/dL - 31(A) 26.2(H)  Creatinine 0.5 - 1.1 mg/dL - 1.0 1.1  Sodium 137 - 147 mmol/L - 142 144  Potassium 3.4 - 5.3 mmol/L - 4.4 4.1  Chloride 101 - 111 mmol/L - - -  CO2 22 - 29 mEq/L - - 27  Calcium 8.4 - 10.4 mg/dL - - 9.1  Total Protein 6.0 - 8.5 g/dL 6.6 - 6.8  Total Bilirubin 0.20 - 1.20 mg/dL - - 0.34  Alkaline Phos 25 - 125 U/L - 51 73  AST 13 - 35 U/L - 12(A) 17  ALT 7 - 35 U/L - 5(A) <9     Impression and Plan: 82 year old Caucasian female  1. Essential thrombocythemia -We reviewed the natural course of essential thrombocythemia with patient and her family members, and the risk of major combination of thrombosis. -Her plt was around 1100K in early 2017, I have gradually increased her anagrelide dose -Her platelet count has gradually improved, has been normal lately, 253K today  -Continue current anagrelide dose at 3 mg in the morning, and 2 mg in the afternoon -Repeat CBC every 2 weeks at  [facility], and fax report to be, [facility] -She'll continue aspirin 81 mg daily  2. Worsening anemia  -Her CBC has shown to slightly worsening anemia, Hb around 8.5 at [facility], and repeated CBC showed hemoglobin 9.0 today, possible related to prior hydrea therapy, or disease progression of her underlying myeloproliferative neoplasm is also possible -Due to her advanced age and dementia, I would not repeat bone marrow biopsy for now -We discussed the management of her anemia, including Epo injections and blood transfusion. The potential benefit and side effects of epo, especially increased risk of cardiovascular disease and thrombosis, we'll discussed with her daughter-in-law in details. She agrees to proceed -I will start her on low dose Aranesp 11mg every 4 weeks, and titrate the dose as needed, the goal of hemoglobin is 10-11   3. Dementia -[facility]  -She'll follow-up with her primary care physician Dr. GNyoka Cowden -  I discussed her with Dr. Nyoka Cowden about the interaction of Aricept and anagrelide, which can cause QT prolongation. He is being monitored. She is on low-dose of Aricept.  4. Anorexia and weight loss -She has been taking some protein shake, gained some weight lately. -I encouraged her to continue nutrition supplement.  5. She will continue follow up with her PCP for other medical issues.    Plan:  -continue anagrelide to 3 mg in the morning and 2 mg in the evening   -start Aranesp 163mg injection every 4 weeks in 2 weeks  -I will see her back in 2 weeks before first injection, her son will come with her next time   FTruitt Merle 09/01/2016

## 2016-09-01 NOTE — Telephone Encounter (Signed)
Appointments scheduled per 10/24 LOS. Patient given AVS and calendar with future scheduled appointments.

## 2016-09-02 LAB — FOLATE RBC
Folate, Hemolysate: 358.6 ng/mL
Folate, RBC: 1333 ng/mL (ref >498)
Hematocrit: 26.9 % — ABNORMAL LOW (ref 34.0–46.6)

## 2016-09-02 LAB — VITAMIN B12: Vitamin B12: 342 pg/mL (ref 211–946)

## 2016-09-02 LAB — KAPPA/LAMBDA LIGHT CHAINS
IG KAPPA FREE LIGHT CHAIN: 21.9 mg/L — AB (ref 3.3–19.4)
IG LAMBDA FREE LIGHT CHAIN: 20.5 mg/L (ref 5.7–26.3)
Kappa/Lambda FluidC Ratio: 1.07 (ref 0.26–1.65)

## 2016-09-02 LAB — ERYTHROPOIETIN: ERYTHROPOIETIN: 23.6 m[IU]/mL — AB (ref 2.6–18.5)

## 2016-09-03 LAB — MULTIPLE MYELOMA PANEL, SERUM
ALBUMIN/GLOB SERPL: 1.3 (ref 0.7–1.7)
Albumin SerPl Elph-Mcnc: 3.6 g/dL (ref 2.9–4.4)
Alpha 1: 0.3 g/dL (ref 0.0–0.4)
Alpha2 Glob SerPl Elph-Mcnc: 0.8 g/dL (ref 0.4–1.0)
B-Globulin SerPl Elph-Mcnc: 1.1 g/dL (ref 0.7–1.3)
GLOBULIN, TOTAL: 3 g/dL (ref 2.2–3.9)
Gamma Glob SerPl Elph-Mcnc: 0.8 g/dL (ref 0.4–1.8)
IGA/IMMUNOGLOBULIN A, SERUM: 114 mg/dL (ref 64–422)
IGG (IMMUNOGLOBIN G), SERUM: 827 mg/dL (ref 700–1600)
IGM (IMMUNOGLOBIN M), SRM: 30 mg/dL (ref 26–217)
Total Protein: 6.6 g/dL (ref 6.0–8.5)

## 2016-09-07 ENCOUNTER — Encounter: Payer: Self-pay | Admitting: Hematology

## 2016-09-10 ENCOUNTER — Telehealth: Payer: Self-pay | Admitting: *Deleted

## 2016-09-10 DIAGNOSIS — I10 Essential (primary) hypertension: Secondary | ICD-10-CM | POA: Diagnosis not present

## 2016-09-10 LAB — CBC AND DIFFERENTIAL
HCT: 24 % — AB (ref 36–46)
Hemoglobin: 7.9 g/dL — AB (ref 12.0–16.0)
Platelets: 211 10*3/uL (ref 150–399)
WBC: 4.5 10^3/mL

## 2016-09-10 NOTE — Telephone Encounter (Signed)
Received faxed lab results from [place] done today.   Dr. Burr Medico reviewed results.  Noted Hgb 7.9  . Pt to receive her first Aranesp injection on 11/13 at office visit.  Called son [name] [name] and left message on voice mail re: Per Dr. Burr Medico, if pt becomes symptomatic and needs to be seen sooner, appts will be rescheduled to next week.

## 2016-09-11 NOTE — Telephone Encounter (Signed)
Spoke with son Jamilya Manalili and informed him re:  Pt's  Platelet count much better from labs done 09/10/16.  Informed [name] that pt's  Hgb was 7.9 ;  [name] understood that if pt becomes symptomatic, pt can be seen sooner than scheduled appt for 09/21/16. [name]   Phone        [phone].

## 2016-09-16 ENCOUNTER — Non-Acute Institutional Stay: Payer: Medicare Other | Admitting: Nurse Practitioner

## 2016-09-16 ENCOUNTER — Encounter: Payer: Self-pay | Admitting: Nurse Practitioner

## 2016-09-16 ENCOUNTER — Telehealth: Payer: Self-pay | Admitting: *Deleted

## 2016-09-16 ENCOUNTER — Other Ambulatory Visit: Payer: Self-pay | Admitting: *Deleted

## 2016-09-16 DIAGNOSIS — F0150 Vascular dementia without behavioral disturbance: Secondary | ICD-10-CM

## 2016-09-16 DIAGNOSIS — D649 Anemia, unspecified: Secondary | ICD-10-CM | POA: Diagnosis not present

## 2016-09-16 DIAGNOSIS — D473 Essential (hemorrhagic) thrombocythemia: Secondary | ICD-10-CM

## 2016-09-16 DIAGNOSIS — F4323 Adjustment disorder with mixed anxiety and depressed mood: Secondary | ICD-10-CM | POA: Diagnosis not present

## 2016-09-16 DIAGNOSIS — I10 Essential (primary) hypertension: Secondary | ICD-10-CM

## 2016-09-16 DIAGNOSIS — R413 Other amnesia: Secondary | ICD-10-CM | POA: Diagnosis not present

## 2016-09-16 NOTE — Assessment & Plan Note (Signed)
09/10/16 Hgb 7.9, repeat CBC in am, may f/u Oncology soon than 09/21/16 scheduled.

## 2016-09-16 NOTE — Assessment & Plan Note (Signed)
12/16/15 MMSE 24/30, 25/30, continue AL for care needs. Supervision for safety 02/20/16 MMSE 24/30. Failed clock drawing Continue Aricept, Namenda

## 2016-09-16 NOTE — Assessment & Plan Note (Signed)
Continue Agrylin 0.5mg  bid, last Plt 211 09/10/16

## 2016-09-16 NOTE — Assessment & Plan Note (Signed)
Stable. Continue observe the patient.  Off Sertraline due to weakness C/o feeling no energy, poor appetite, better with  Mirtazapine 7.5mg  daily in setting of weigh loss

## 2016-09-16 NOTE — Telephone Encounter (Signed)
"  Lab on 09-10-2016 Hgb = 7.9.  Cbc will be repeated tomorrow morning.  Dr. Nyoka Cowden ordered patient F/U with oncology ASAP.  Was last weeks lab received?  We will fax the order from Dr. Nyoka Cowden and fax tomorrow's cbc results."

## 2016-09-16 NOTE — Progress Notes (Signed)
Location:  [place] Room Number: [id number] Place of Service:  ALF [phone]) Provider:  Anglea Gordner, Manxie  NP [name], MD  Patient Care Team: Estill Dooms, MD as PCP - General (Internal Medicine) [place] Yomaris Palecek Otho Darner, NP as Nurse Practitioner (Nurse Practitioner) Truitt Merle, MD as Consulting Physician (Hematology)  Extended Emergency Contact Information Primary Emergency Contact: [name] Phone: [phone] Relation: Friend  Code Status:  DNR Goals of care: Advanced Directive information Advanced Directives 09/16/2016  Does patient have an advance directive? Yes  Type of Paramedic of [place];Living will  Does patient want to make changes to advanced directive? No - Patient declined  Copy of advanced directive(s) in chart? Yes  Would patient like information on creating an advanced directive? -     Chief Complaint  Patient presents with  . Acute Visit    anemia    HPI:  Pt is a 82 y.o. female seen today for an acute visit for anemia, Hgb 7.9 09/10/16.  Hx of HTN, controlled on Atenolol 25mg  bid, essential thrombocythemia, takes Agrylin 0.5mg  bid, plt 347 07/14/16 anemia Hgb 8.9 07/14/16, memory, short terms mostly, talking Namenda, Aricept, resides in AL for care need, mood and weight are  managed with Mirtazapine 7.5mg   Past Medical History:  Diagnosis Date  . Abdominal pain, unspecified site 08/11/2012  . Anemia, unspecified 06/18/2011  . Arthritis   . BBB (bundle branch block) 03/03/2006  . Cardiac dysrhythmia, unspecified 03/03/2006  . Chronic interstitial cystitis 03/03/2006  . 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 Disorder of bone and cartilage, unspecified 04/20/2007  . Diverticulosis of colon (without mention of hemorrhage) 04/20/2007  . Essential thrombocythemia ([place]) 06/18/2011  . Glaucoma   . Hematuria, unspecified 04/22/2004  . Hemorrhage of gastrointestinal tract, unspecified  03/09/2007  . Herpes zoster without mention of complication [id number]  . Hypertension   . 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 Loss of weight 06/18/2011  . Lumbago 12/18/2010  . Memory loss 12/19/2009  . Neck fracture ([place])   . Osteoarthrosis, unspecified whether generalized or localized, unspecified site 03/03/2006  . Other and unspecified hyperlipidemia 09/01/2006  . Sciatica 06/19/2010   Past Surgical History:  Procedure Laterality Date  . ABDOMINAL HYSTERECTOMY    . APPENDECTOMY    . BREAST CYST EXCISION Bilateral    benign  . CATARACT EXTRACTION W/ INTRAOCULAR LENS  IMPLANT, BILATERAL    . TONSILLECTOMY      Allergies  Allergen Reactions  . Darvocet [Propoxyphene N-Acetaminophen] Nausea And Vomiting  . Darvon Nausea And Vomiting  . Meperidine And Related Nausea And Vomiting  . Vicodin [Hydrocodone-Acetaminophen] Nausea And Vomiting  . Demerol [Meperidine]       Medication List       Accurate as of 09/16/16  2:34 PM. Always use your most recent med list.          anagrelide 1 MG capsule Commonly known as:  AGRYLIN Take 1 mg by mouth as directed. Take 3 mg in  AM  ;  And continue  2 mg in  PM.  As of  06/18/16.   aspirin 81 MG tablet Take 81 mg by mouth daily.   atenolol 25 MG tablet Commonly known as:  TENORMIN Take 25 mg by mouth 2 (two) times daily.   calcitonin (salmon) 200 UNIT/ACT nasal spray Commonly known as:  MIACALCIN/FORTICAL Place 1 spray into alternate nostrils daily.   CALCIUM 600/VITAMIN D 600-400 MG-UNIT  Tabs Generic drug:  Calcium Carbonate-Vitamin D3 Take 1 tablet by mouth 2 (two) times daily with a meal.   donepezil 10 MG tablet Commonly known as:  ARICEPT Take one tablet at bedtime for memory   dorzolamide-timolol 22.3-6.8 MG/ML ophthalmic solution Commonly known as:  COSOPT Place 1 drop into both eyes 2 (two) times daily. 2% - 0.5%.   feeding supplement Liqd Take 90 mLs by mouth 2 (two) times daily between meals.   flurbiprofen  100 MG tablet Commonly known as:  ANSAID TAKE 1 TABLET BY MOUTH EVERY DAY   latanoprost 0.005 % ophthalmic solution Commonly known as:  XALATAN Place 1 drop into both eyes at bedtime.   LORazepam 1 MG tablet Commonly known as:  ATIVAN Take 0.25 mg by mouth as needed for sleep.   Magnesium 250 MG Tabs Take 1 tablet by mouth daily.   memantine 10 MG tablet Commonly known as:  NAMENDA One twice daily to help preserve memory   URELLE 81 MG Tabs tablet Take 1 tablet by mouth at bedtime as needed.   vitamin E 400 UNIT capsule Take 400 Units by mouth daily.       Review of Systems  Constitutional: Negative for activity change, appetite change, chills, diaphoresis, fatigue, fever and unexpected weight change.  HENT: Positive for voice change (hoarse since September 2014). Negative for congestion, ear discharge, ear pain, hearing loss, postnasal drip, rhinorrhea, sore throat, tinnitus and trouble swallowing.   Eyes: Negative.  Negative for pain, redness, itching and visual disturbance.  Respiratory: Negative.  Negative for cough, choking, shortness of breath and wheezing.   Cardiovascular: Negative.  Negative for chest pain, palpitations and leg swelling.  Gastrointestinal: Negative.  Negative for abdominal distention, abdominal pain, constipation, diarrhea and nausea.  Endocrine: Negative.  Negative for cold intolerance, heat intolerance, polydipsia, polyphagia and polyuria.  Genitourinary: Negative.  Negative for difficulty urinating, dysuria, flank pain, frequency, hematuria, pelvic pain, urgency and vaginal discharge.  Musculoskeletal: Negative.  Negative for arthralgias, back pain, gait problem, myalgias, neck pain and neck stiffness.  Skin: Positive for wound. Negative for color change, pallor and rash.  Allergic/Immunologic: Negative.   Neurological: Negative for dizziness, tremors, seizures, syncope, weakness, numbness and headaches.       Memory loss  Hematological: Negative  for adenopathy. Does not bruise/bleed easily.       Thrombocythemia  Psychiatric/Behavioral: Negative.  Negative for agitation, behavioral problems, confusion, dysphoric mood, hallucinations, sleep disturbance and suicidal ideas. The patient is not nervous/anxious and is not hyperactive.     Immunization History  Administered Date(s) Administered  . Influenza Whole 08/09/2012, 07/10/2013  . Influenza-Unspecified 08/14/2014, 08/08/2015  . Pneumococcal Polysaccharide-23 11/09/2004  . Zoster 10/07/2006   Pertinent  Health Maintenance Due  Topic Date Due  . PNA vac Low Risk Adult (2 of 2 - PCV13) 11/09/2005  . INFLUENZA VACCINE  06/09/2016  . DEXA SCAN  Completed   Fall Risk  02/27/2016 12/19/2015 08/22/2015 02/21/2015 09/07/2014  Falls in the past year? No No No No No   Functional Status Survey:    Vitals:   09/16/16 1338  BP: 120/68  Pulse: 78  Resp: 18  Temp: 98.1 F (36.7 C)  Weight: 98 lb 8 oz (44.7 kg)  Height: 5\' 1"  (1.549 m)   Body mass index is 18.61 kg/m. Physical Exam  Constitutional: She is oriented to person, place, and time. She appears well-developed and well-nourished. No distress.  HENT:  Head: Normocephalic and atraumatic.  Right Ear: External ear  normal.  Left Ear: External ear normal.  Nose: Nose normal.  Mouth/Throat: Oropharynx is clear and moist.  Eyes: Conjunctivae and EOM are normal. Pupils are equal, round, and reactive to light.  Corrective lenses  Neck: Neck supple. No JVD present. No tracheal deviation present. No thyromegaly present.  Cardiovascular: Normal rate and regular rhythm.  Exam reveals no gallop and no friction rub.   Murmur (2/6/LSB SEM) heard. Pulmonary/Chest: No respiratory distress. She has no wheezes. She has no rales.  Abdominal: She exhibits no distension and no mass. There is no tenderness.  Musculoskeletal: She exhibits no edema or tenderness.  Heberden's nodes. Bouchard's nodes. Contracture of the right hand.    Lymphadenopathy:    She has no cervical adenopathy.  Neurological: She is alert and oriented to person, place, and time. She has normal reflexes. No cranial nerve deficit. Coordination normal.  08/22/2015 MMSE 28/30. Failed clock drawing.  Skin: No rash noted. No erythema. No pallor.  Left forehead traumatic laceration, sutures intact, no s/s of infection.   Psychiatric: She has a normal mood and affect. Her behavior is normal. Judgment and thought content normal.    Labs reviewed:  Recent Labs  12/12/15 1732  01/08/16 0917 03/24/16 0838 06/11/16 06/18/16 1013 07/14/16  NA 143  < > 140 142 142 144 142  K 3.6  < > 3.8 4.3 4.8 4.1 4.4  CL 108  --  107  --   --   --   --   CO2 23  < > 24 27  --  27  --   GLUCOSE 158*  < > 105* 94  --  87  --   BUN 26*  < > 21* 21.0 26* 26.2* 31*  CREATININE 1.10*  < > 1.08* 0.9 1.0 1.1 1.0  CALCIUM 8.8*  < > 8.5* 8.8  --  9.1  --   < > = values in this interval not displayed.  Recent Labs  01/08/16 0917 03/24/16 0838 06/11/16 06/18/16 1013 07/14/16 09/01/16 1145  AST 26 16 15 17  12*  --   ALT 14 <9 7 <9 5*  --   ALKPHOS 47 82 63 73 51  --   BILITOT 0.6 0.30  --  0.34  --   --   PROT 6.2* 6.3*  --  6.8  --  6.6  ALBUMIN 3.4* 3.2*  --  3.5  --   --     Recent Labs  03/24/16 0838  06/18/16 1013  07/09/16 09/01/16 0902 09/01/16 1145 09/10/16  WBC 4.8  < > 5.4  < > 4.8 5.6  --  4.5  NEUTROABS 3.2  --  3.7  --   --  3.7  --   --   HGB 9.8*  < > 9.9*  < > 10.0* 9.0*  --  7.9*  HCT 30.9*  < > 31.5*  < > 31* 29.5* 26.9* 24*  MCV 94.5  --  92.4  --   --  93.9  --   --   PLT 502*  < > 491*  < > 398 253  --  211  < > = values in this interval not displayed. No results found for: TSH No results found for: HGBA1C Lab Results  Component Value Date   CHOL 174 07/14/2016   HDL 59 07/14/2016   LDLCALC 92 07/14/2016   TRIG 117 07/14/2016    Significant Diagnostic Results in last 30 days:  No results  found.  Assessment/Plan  Hypertension Controlled, continue Atenolol 25mg  bid. 07/14/16 Hgb 8.9, plt 347.  Na 142, K 4.4, Bun 31, creat 1.02, TP 5.2, albumin 3.2, LDL 92    Dementia Continue Namenda, Aricept    Essential thrombocythemia (H[facility]) Continue Agrylin 0.5mg  bid, last Plt 211 09/10/16   Anemia 09/10/16 Hgb 7.9, repeat CBC in am, may f/u Oncology soon than 09/21/16 scheduled.   Memory loss 12/16/15 MMSE 24/30, 25/30, continue AL for care needs. Supervision for safety 02/20/16 MMSE 24/30. Failed clock drawing Continue Aricept, Namenda  Adjustment disorder with mixed anxiety and depressed mood Stable. Continue observe the patient.  Off Sertraline due to weakness C/o feeling no energy, poor appetite, better with  Mirtazapine 7.5mg  daily in setting of weigh loss      Family/ staff Communication: continue AL for care assistance.   Labs/tests ordered:  CBC

## 2016-09-16 NOTE — Assessment & Plan Note (Signed)
Controlled, continue Atenolol 25mg  bid. 07/14/16 Hgb 8.9, plt 347.  Na 142, K 4.4, Bun 31, creat 1.02, TP 5.2, albumin 3.2, LDL 92

## 2016-09-16 NOTE — Assessment & Plan Note (Signed)
Continue Namenda, Aricept

## 2016-09-17 ENCOUNTER — Other Ambulatory Visit: Payer: Self-pay | Admitting: Hematology

## 2016-09-17 ENCOUNTER — Non-Acute Institutional Stay: Payer: Medicare Other | Admitting: Internal Medicine

## 2016-09-17 ENCOUNTER — Ambulatory Visit: Payer: Medicare Other | Admitting: Hematology

## 2016-09-17 ENCOUNTER — Other Ambulatory Visit: Payer: Medicare Other

## 2016-09-17 ENCOUNTER — Encounter: Payer: Self-pay | Admitting: Internal Medicine

## 2016-09-17 VITALS — BP 130/70 | HR 60 | Temp 98.9°F | Ht 61.0 in | Wt 97.8 lb

## 2016-09-17 DIAGNOSIS — R413 Other amnesia: Secondary | ICD-10-CM | POA: Diagnosis not present

## 2016-09-17 DIAGNOSIS — G4700 Insomnia, unspecified: Secondary | ICD-10-CM | POA: Diagnosis not present

## 2016-09-17 DIAGNOSIS — D473 Essential (hemorrhagic) thrombocythemia: Secondary | ICD-10-CM | POA: Diagnosis not present

## 2016-09-17 DIAGNOSIS — R634 Abnormal weight loss: Secondary | ICD-10-CM | POA: Diagnosis not present

## 2016-09-17 DIAGNOSIS — E785 Hyperlipidemia, unspecified: Secondary | ICD-10-CM | POA: Diagnosis not present

## 2016-09-17 DIAGNOSIS — I10 Essential (primary) hypertension: Secondary | ICD-10-CM

## 2016-09-17 DIAGNOSIS — D649 Anemia, unspecified: Secondary | ICD-10-CM

## 2016-09-17 LAB — CBC AND DIFFERENTIAL
HEMATOCRIT: 27 % — AB (ref 36–46)
Hemoglobin: 8.5 g/dL — AB (ref 12.0–16.0)
PLATELETS: 254 10*3/uL (ref 150–399)
WBC: 4.9 10*3/mL

## 2016-09-17 NOTE — Progress Notes (Signed)
[place] Room Number: [phone]  Place of Service: Clinic (12)     Allergies  Allergen Reactions  . Darvocet [Propoxyphene N-Acetaminophen] Nausea And Vomiting  . Darvon Nausea And Vomiting  . Meperidine And Related Nausea And Vomiting  . Vicodin [Hydrocodone-Acetaminophen] Nausea And Vomiting  . Demerol [Meperidine]     Chief Complaint  Patient presents with  . Medical Management of Chronic Issues    3 month medication management anemia, blood pressure, memory  . Sleeping Problem    having trouble falling alsleep  . Anorexia  . MMSE    25/30 passed clock drawing    HPI:   Anemia, unspecified type - chronic condition. Seeing Dr. Krista Blue.  Essential thrombocythemia ([place]) - improved on anegrelide.  Essential hypertension -- controlled  Hyperlipidemia, unspecified hyperlipidemia type - controlled  Loss of weight - stable for the last year  Insomnia, unspecified type - could not sleep at all last night. This is the first time it happened Drowsy today.  Memory loss - she is con cerned about her memory loss. MMSE was scored 3 point slower than last year despite using donepezil and memantine. No headache.    Medications: Patient's Medications  New Prescriptions   No medications on file  Previous Medications   ANAGRELIDE (AGRYLIN) 1 MG CAPSULE    Take 1 mg by mouth as directed. Take 3 mg in  AM  ;  And continue  2 mg in  PM.  As of  06/18/16.   ASPIRIN 81 MG TABLET    Take 81 mg by mouth daily.   ATENOLOL (TENORMIN) 25 MG TABLET    Take 25 mg by mouth 2 (two) times daily.   CALCITONIN, SALMON, (MIACALCIN/FORTICAL) 200 UNIT/ACT NASAL SPRAY    Place 1 spray into alternate nostrils daily.   CALCIUM CARBONATE-VITAMIN D3 (CALCIUM 600/VITAMIN D) 600-400 MG-UNIT TABS    Take 1 tablet by mouth 2 (two) times daily with a meal.   DONEPEZIL (ARICEPT) 10 MG TABLET    Take one tablet at bedtime for memory   DORZOLAMIDE-TIMOLOL (COSOPT) 22.3-6.8 MG/ML OPHTHALMIC  SOLUTION    Place 1 drop into both eyes 2 (two) times daily. 2% - 0.5%.   FEEDING SUPPLEMENT (BOOST / RESOURCE BREEZE) LIQD    Take 90 mLs by mouth 2 (two) times daily between meals.    FLURBIPROFEN (ANSAID) 100 MG TABLET    TAKE 1 TABLET BY MOUTH EVERY DAY   LATANOPROST (XALATAN) 0.005 % OPHTHALMIC SOLUTION    Place 1 drop into both eyes at bedtime.   LORAZEPAM (ATIVAN) 1 MG TABLET    Take 0.25 mg by mouth as needed for sleep.   MAGNESIUM 250 MG TABS    Take 1 tablet by mouth daily.    MEMANTINE (NAMENDA) 10 MG TABLET    One twice daily to help preserve memory   URELLE (URELLE/URISED) 81 MG TABS    Take 1 tablet by mouth at bedtime as needed.    VITAMIN E 400 UNIT CAPSULE    Take 400 Units by mouth daily.  Modified Medications   No medications on file  Discontinued Medications   No medications on file     Review of Systems  Constitutional: Negative for activity change, appetite change, chills, diaphoresis, fatigue, fever and unexpected weight change.  HENT: Positive for voice change (hoarse since September 2014). Negative for congestion, ear discharge, ear pain, hearing loss, postnasal drip, rhinorrhea, sore throat, tinnitus and trouble swallowing.  Eyes: Negative.  Negative for pain, redness, itching and visual disturbance.  Respiratory: Negative.  Negative for cough, choking, shortness of breath and wheezing.   Cardiovascular: Negative.  Negative for chest pain, palpitations and leg swelling.  Gastrointestinal: Negative.  Negative for abdominal distention, abdominal pain, constipation, diarrhea and nausea.  Endocrine: Negative.  Negative for cold intolerance, heat intolerance, polydipsia, polyphagia and polyuria.  Genitourinary: Negative.  Negative for difficulty urinating, dysuria, flank pain, frequency, hematuria, pelvic pain, urgency and vaginal discharge.  Musculoskeletal: Negative.  Negative for arthralgias, back pain, gait problem, myalgias, neck pain and neck stiffness.  Skin:  Positive for wound. Negative for color change, pallor and rash.  Allergic/Immunologic: Negative.   Neurological: Negative for dizziness, tremors, seizures, syncope, weakness, numbness and headaches.       Memory loss  Hematological: Negative for adenopathy. Does not bruise/bleed easily.       Thrombocythemia  Psychiatric/Behavioral: Negative.  Negative for agitation, behavioral problems, confusion, dysphoric mood, hallucinations, sleep disturbance and suicidal ideas. The patient is not nervous/anxious and is not hyperactive.     Vitals:   09/17/16 1548  BP: 130/70  Pulse: 60  Temp: 98.9 F (37.2 C)  TempSrc: Oral  SpO2: 94%  Weight: 97 lb 12.8 oz (44.4 kg)  Height: _0  (1.549 m)   Wt Readings from Last 3 Encounters:  09/17/16 97 lb 12.8 oz (44.4 kg)  09/16/16 98 lb 8 oz (44.7 kg)  09/01/16 98 lb (44.5 kg)    Body mass index is 18.48 kg/m.  Physical Exam  Constitutional: She is oriented to person, place, and time. She appears well-developed and well-nourished. No distress.  HENT:  Head: Normocephalic and atraumatic.  Right Ear: External ear normal.  Left Ear: External ear normal.  Nose: Nose normal.  Mouth/Throat: Oropharynx is clear and moist.  Eyes: Conjunctivae and EOM are normal. Pupils are equal, round, and reactive to light.  Corrective lenses  Neck: Neck supple. No JVD present. No tracheal deviation present. No thyromegaly present.  Cardiovascular: Normal rate and regular rhythm.  Exam reveals no gallop and no friction rub.   Murmur (2/6/LSB SEM) heard. Pulmonary/Chest: No respiratory distress. She has no wheezes. She has no rales.  Abdominal: She exhibits no distension and no mass. There is no tenderness.  Musculoskeletal: She exhibits no edema or tenderness.  Heberden's nodes. Bouchard's nodes. Contracture of the right hand.  Lymphadenopathy:    She has no cervical adenopathy.  Neurological: She is alert and oriented to person, place, and time. She has normal  reflexes. No cranial nerve deficit. Coordination normal.  08/22/2015 MMSE 28/30. Failed clock drawing. 09/17/16 MMSE 25/30. Failed clock drawing.  Skin: No rash noted. No erythema. No pallor.  Psychiatric: She has a normal mood and affect. Her behavior is normal. Judgment and thought content normal.     Labs reviewed: Lab Summary Latest Ref Rng & Units 09/17/2016 09/10/2016 09/01/2016 09/01/2016 07/14/2016 07/09/2016 07/02/2016  Hemoglobin 12.0 - 16.0 g/dL 8.5(A) 7.9(A) 9.0(L) (None) (None) 10.0(A) 9.6(A)  Hematocrit 36 - 46 % 27(A) 24(A) 26.9(L) 29.5(L) (None) 31(A) 30(A)  White count 10:3/mL 4.9 4.5 5.6 (None) (None) 4.8 6.1  Platelet count 150 - 399 K/L 254 211 253 (None) (None) 398 468(A)  Sodium 137 - 147 mmol/L (None) (None) (None) (None) 142 (None) (None)  Potassium 3.4 - 5.3 mmol/L (None) (None) (None) (None) 4.4 (None) (None)  Calcium - (None) (None) (None) (None) (None) (None) (None)  Phosphorus - (None) (None) (None) (None) (None) (None) (None)  Creatinine  0.5 - 1.1 mg/dL (None) (None) (None) (None) 1.0 (None) (None)  AST 13 - 35 U/L (None) (None) (None) (None) 12(A) (None) (None)  Alk Phos 25 - 125 U/L (None) (None) (None) (None) 51 (None) (None)  Bilirubin - (None) (None) (None) (None) (None) (None) (None)  Glucose - (None) (None) (None) (None) (None) (None) (None)  Cholesterol 0 - 200 mg/dL (None) (None) (None) (None) 174 (None) (None)  HDL cholesterol 35 - 70 mg/dL (None) (None) (None) (None) 59 (None) (None)  Triglycerides 40 - 160 mg/dL (None) (None) (None) (None) 117 (None) (None)  LDL Direct - (None) (None) (None) (None) (None) (None) (None)  LDL Calc mg/dL (None) (None) (None) (None) 92 (None) (None)  Total protein - (None) (None) (None) (None) (None) (None) (None)  Albumin - (None) (None) (None) (None) (None) (None) (None)  Some recent data might be hidden   No results found for: TSH Lab Results  Component Value Date   BUN 31 (A) 07/14/2016   BUN 26.2 (H) 06/18/2016    BUN 26 (A) 06/11/2016   Lab Results  Component Value Date   CREATININE 1.0 07/14/2016   CREATININE 1.1 06/18/2016   CREATININE 1.0 06/11/2016   No results found for: HGBA1C     Assessment/Plan  1. Anemia, unspecified type Chronic. Continue visits with Dr.Yan  2. Essential thrombocythemia ([place]) improved  3. Essential hypertension controlled  4. Hyperlipidemia, unspecified hyperlipidemia type controlled  5. Loss of weight stable for the last 2 years. Goes up and then down, then up again  6. Insomnia, unspecified type I think this was a temporary issue last night. n new med added for this problem.  7. Memory loss continue donepezil and memantine

## 2016-09-20 NOTE — Progress Notes (Signed)
Hematology and Oncology Follow Up Visit Date of Visit: 09/21/16   [name] [name] [id number] 1923-06-29 82 y.o. [name] Hubert, MD  Chief Complaint: Essential Thrombocytosis.  Principle Diagnosis: Essential thrombocytosis, JAK2 mutation (-), BCR-ABL(-)   Previous therapy:  1. Initially the patient was on Hydrea 1500 mg QOD and 1000 mg QOD, with significant drop in platelet count to 60,000 and neutropenia, hydrea was decreased, dose uncertain due to pt's dementia and memory issue since 11/07/2015, held on 12/26/2015 2. Anagrelide 0.5 mg twice daily started on 12/26/2015, dose increased to 23m bid on 02/17/2016, further increased to 336mAM, 47m36mM on 06/19/2016  Current therapy:   1. Anagrelide 3mg31m AM and 2 mg in PM,  Aspirin 81 mg for thromboprophylaxis. 2. Aranesp injection 150mc25mery 4 weeks, started on 09/21/2016  Interim History:  Ms. [name] [name] to the office for follow-up and [name] first dose Aranesp injection. She is accompanied by [name] [name] to the clinic today. She has been feeling tired lately, able to move around, no chest pain or other new complaints. She denies any signs of bleeding. No other new complaints.  Medications: I have reviewed the patient's current medications.  Current Outpatient Prescriptions  Medication Sig Dispense Refill  . anagrelide (AGRYLIN) 1 MG capsule Take 1 mg by mouth as directed. Take 3 mg in  AM  ;  And continue  2 mg in  PM.  As of  06/18/16.    . aspMarland Kitchenrin 81 MG tablet Take 81 mg by mouth daily.    . ateMarland Kitchenolol (TENORMIN) 25 MG tablet Take 25 mg by mouth 2 (two) times daily.    . calcitonin, salmon, (MIACALCIN/FORTICAL) 200 UNIT/ACT nasal spray Place 1 spray into alternate nostrils daily.    . Calcium Carbonate-Vitamin D3 (CALCIUM 600/VITAMIN D) 600-400 MG-UNIT TABS Take 1 tablet by mouth 2 (two) times daily with a meal.    . donepezil (ARICEPT) 10 MG tablet Take one tablet at bedtime for memory    . dorzolamide-timolol (COSOPT) 22.3-6.8 MG/ML ophthalmic  solution Place 1 drop into both eyes 2 (two) times daily. 2% - 0.5%.    . feeding supplement (BOOST / RESOURCE BREEZE) LIQD Take 90 mLs by mouth 2 (two) times daily between meals.     . flurbiprofen (ANSAID) 100 MG tablet TAKE 1 TABLET BY MOUTH EVERY DAY 90 tablet 2  . latanoprost (XALATAN) 0.005 % ophthalmic solution Place 1 drop into both eyes at bedtime.    . LORMarland Kitchenzepam (ATIVAN) 1 MG tablet Take 0.25 mg by mouth as needed for sleep.    . Magnesium 250 MG TABS Take 1 tablet by mouth daily.     . memantine (NAMENDA) 10 MG tablet One twice daily to help preserve memory 60 tablet 5  . URELLE (URELLE/URISED) 81 MG TABS Take 1 tablet by mouth at bedtime as needed.     . vitamin E 400 UNIT capsule Take 400 Units by mouth daily.     No current facility-administered medications for this visit.    Facility-Administered Medications Ordered in Other Visits  Medication Dose Route Frequency Provider Last Rate Last Dose  . Darbepoetin Alfa (ARANESP) injection 100 mcg  100 mcg Subcutaneous Once Alfredo Collymore FTruitt Merle      Allergies:  Allergies  Allergen Reactions  . Darvocet [Propoxyphene N-Acetaminophen] Nausea And Vomiting  . Darvon Nausea And Vomiting  . Meperidine And Related Nausea And Vomiting  . Vicodin [Hydrocodone-Acetaminophen] Nausea And Vomiting  . Demerol [Meperidine]    Past Medical History,  Surgical history, Social history, and Family History were reviewed and updated.  Review of Systems: Constitutional:  Negative for fever, chills, night sweats, anorexia, weight loss, pain. Cardiovascular: no chest pain or dyspnea on exertion Respiratory: no cough, shortness of breath, or wheezing Neurological: no TIA or stroke symptoms, (+) memory loss Dermatological: negative for rash ENT: negative for - epistaxis or headaches Skin: Negative. Gastrointestinal: no abdominal pain, change in bowel habits, or black or bloody stools Genito-Urinary: no dysuria, trouble voiding, or hematuria Hematological  and Lymphatic: negative for - bleeding problems or blood clots Breast: negative for breast lumps Musculoskeletal: negative for - gait disturbance Remaining ROS negative.  Physical Exam: Blood pressure 133/81, pulse 61, temperature 98.4 F (36.9 C), temperature source Oral, resp. rate 17, height _0  (1.549 m), weight 97 lb 3.2 oz (44.1 kg), SpO2 99 %. ECOG: 2 General appearance: alert, cooperative, appears stated age and no distress Head: Normocephalic, without obvious abnormality, atraumatic Neck: no adenopathy, supple, symmetrical, trachea midline and thyroid not enlarged, symmetric, no tenderness/mass/nodules HEENT: OP clear; PERRL; EOMi.  Lymph nodes: Cervical, supraclavicular, and axillary nodes normal. Heart:regular rate and rhythm, S1, S2 normal, no murmur, click, rub or gallop Lung:chest clear, no wheezing, rales, normal symmetric air entry Abdomin: soft, non-tender, without masses or organomegaly EXT:No peripheral edema Skin: No bruises or skin rashes  Lab Results: CBC Latest Ref Rng & Units 09/21/2016 09/17/2016 09/10/2016  WBC 3.9 - 10.3 10e3/uL 5.8 4.9 4.5  Hemoglobin 11.6 - 15.9 g/dL 8.6(L) 8.5(A) 7.9(A)  Hematocrit 34.8 - 46.6 % 28.1(L) 27(A) 24(A)  Platelets 145 - 400 10e3/uL 312 254 211    CMP Latest Ref Rng & Units 09/21/2016 09/01/2016 07/14/2016  Glucose 70 - 140 mg/dl 90 - -  BUN 7.0 - 26.0 mg/dL 28.5(H) - 31(A)  Creatinine 0.6 - 1.1 mg/dL 1.3(H) - 1.0  Sodium 136 - 145 mEq/L 147(H) - 142  Potassium 3.5 - 5.1 mEq/L 4.3 - 4.4  Chloride 101 - 111 mmol/L - - -  CO2 22 - 29 mEq/L 27 - -  Calcium 8.4 - 10.4 mg/dL 9.1 - -  Total Protein 6.4 - 8.3 g/dL 6.5 6.6 -  Total Bilirubin 0.20 - 1.20 mg/dL 0.47 - -  Alkaline Phos 40 - 150 U/L 73 - 51  AST 5 - 34 U/L 17 - 12(A)  ALT 0 - 55 U/L 8 - 5(A)     Impression and Plan: 82 year old Caucasian female  1. Essential thrombocythemia -We reviewed the natural course of essential thrombocythemia with patient and [name]  family members, and the risk of major combination of thrombosis. -[name] plt was around 1100K in early 2017, I have gradually increased [name] anagrelide dose -[name] platelet count has gradually improved, has been normal lately, 253K today  -Continue current anagrelide dose at 3 mg in the morning, and 2 mg in the afternoon -Repeat CBC every 2 weeks at [facility], and fax report to be, [facility] -She'll continue aspirin 81 mg daily  2. Anemia of chronic disease  -[name] CBC has shown to slightly worsening anemia, Hb around 7.8-8.5 at [facility], and repeated CBC showed hemoglobin 8.6 today, probably related to [name] multiple chronic disease, especially chronic kidney disease. It's also possible related to prior hydrea therapy, or disease progression of [name] underlying myeloproliferative neoplasm. -Due to [name] advanced age and dementia, I would not repeat bone marrow biopsy for now -We discussed the management of [name] anemia, including Epo injections and blood transfusion. The  potential benefit and side effects of epo, especially increased risk of cardiovascular disease and thrombosis, we'll discussed with [name] [name] in details. She agrees to proceed -I will start [name] on low dose Aranesp 187mg every 4 weeks, and titrate the dose as needed, the goal of hemoglobin is 10-11   3. Dementia -[facility]  -She'll follow-up with [name] primary care physician Dr. GNyoka Cowden -I discussed [name] with Dr. GNyoka Cowdenabout the interaction of Aricept and anagrelide, which can cause QT prolongation. He is being monitored. She is on low-dose of Aricept.  4. Anorexia and weight loss -She has been taking some protein shake, and a boost gained some weight lately. -I encouraged [name] to continue nutrition supplement.  5. She will continue follow up with [name] PCP for other medical issues.    Plan:  -continue anagrelide to 3 mg in the morning and 2 mg in the evening   -start  Aranesp 1570m injection every 4 weeks today -she will have lab CBC in 2 and 6 weeks at [facility] -lab and aranesp injection every 4 weeks, I'll see [name] back in 8 weeks  FeTruitt Merle11/13/2017

## 2016-09-21 ENCOUNTER — Encounter: Payer: Self-pay | Admitting: Hematology

## 2016-09-21 ENCOUNTER — Ambulatory Visit (HOSPITAL_BASED_OUTPATIENT_CLINIC_OR_DEPARTMENT_OTHER): Payer: Medicare Other | Admitting: Hematology

## 2016-09-21 ENCOUNTER — Other Ambulatory Visit (HOSPITAL_BASED_OUTPATIENT_CLINIC_OR_DEPARTMENT_OTHER): Payer: Medicare Other

## 2016-09-21 ENCOUNTER — Telehealth: Payer: Self-pay | Admitting: Hematology

## 2016-09-21 ENCOUNTER — Ambulatory Visit (HOSPITAL_BASED_OUTPATIENT_CLINIC_OR_DEPARTMENT_OTHER): Payer: Medicare Other

## 2016-09-21 VITALS — BP 133/81 | HR 61 | Temp 98.4°F | Resp 17 | Ht 61.0 in | Wt 97.2 lb

## 2016-09-21 DIAGNOSIS — N189 Chronic kidney disease, unspecified: Secondary | ICD-10-CM

## 2016-09-21 DIAGNOSIS — D631 Anemia in chronic kidney disease: Secondary | ICD-10-CM

## 2016-09-21 DIAGNOSIS — I10 Essential (primary) hypertension: Secondary | ICD-10-CM

## 2016-09-21 DIAGNOSIS — D473 Essential (hemorrhagic) thrombocythemia: Secondary | ICD-10-CM

## 2016-09-21 DIAGNOSIS — F0390 Unspecified dementia without behavioral disturbance: Secondary | ICD-10-CM

## 2016-09-21 DIAGNOSIS — R630 Anorexia: Secondary | ICD-10-CM

## 2016-09-21 DIAGNOSIS — D649 Anemia, unspecified: Secondary | ICD-10-CM

## 2016-09-21 DIAGNOSIS — F0150 Vascular dementia without behavioral disturbance: Secondary | ICD-10-CM

## 2016-09-21 DIAGNOSIS — R634 Abnormal weight loss: Secondary | ICD-10-CM

## 2016-09-21 LAB — COMPREHENSIVE METABOLIC PANEL
ALT: 8 U/L (ref 0–55)
ANION GAP: 9 meq/L (ref 3–11)
AST: 17 U/L (ref 5–34)
Albumin: 3.4 g/dL — ABNORMAL LOW (ref 3.5–5.0)
Alkaline Phosphatase: 73 U/L (ref 40–150)
BILIRUBIN TOTAL: 0.47 mg/dL (ref 0.20–1.20)
BUN: 28.5 mg/dL — ABNORMAL HIGH (ref 7.0–26.0)
CALCIUM: 9.1 mg/dL (ref 8.4–10.4)
CO2: 27 mEq/L (ref 22–29)
CREATININE: 1.3 mg/dL — AB (ref 0.6–1.1)
Chloride: 111 mEq/L — ABNORMAL HIGH (ref 98–109)
EGFR: 37 mL/min/{1.73_m2} — ABNORMAL LOW (ref >90)
Glucose: 90 mg/dl (ref 70–140)
Potassium: 4.3 mEq/L (ref 3.5–5.1)
Sodium: 147 mEq/L — ABNORMAL HIGH (ref 136–145)
TOTAL PROTEIN: 6.5 g/dL (ref 6.4–8.3)

## 2016-09-21 LAB — CBC & DIFF AND RETIC
BASO%: 1.7 % (ref 0.0–2.0)
Basophils Absolute: 0.1 10*3/uL (ref 0.0–0.1)
EOS%: 0.9 % (ref 0.0–7.0)
Eosinophils Absolute: 0.1 10*3/uL (ref 0.0–0.5)
HEMATOCRIT: 28.1 % — AB (ref 34.8–46.6)
HGB: 8.6 g/dL — ABNORMAL LOW (ref 11.6–15.9)
IMMATURE RETIC FRACT: 25.3 % — AB (ref 1.60–10.00)
LYMPH%: 20.7 % (ref 14.0–49.7)
MCH: 29.3 pg (ref 25.1–34.0)
MCHC: 30.6 g/dL — AB (ref 31.5–36.0)
MCV: 95.6 fL (ref 79.5–101.0)
MONO#: 0.8 10*3/uL (ref 0.1–0.9)
MONO%: 12.9 % (ref 0.0–14.0)
NEUT#: 3.7 10*3/uL (ref 1.5–6.5)
NEUT%: 63.8 % (ref 38.4–76.8)
PLATELETS: 312 10*3/uL (ref 145–400)
RBC: 2.94 10*6/uL — ABNORMAL LOW (ref 3.70–5.45)
RDW: 19.3 % — ABNORMAL HIGH (ref 11.2–14.5)
RETIC CT ABS: 77.32 10*3/uL (ref 33.70–90.70)
Retic %: 2.63 % — ABNORMAL HIGH (ref 0.70–2.10)
WBC: 5.8 10*3/uL (ref 3.9–10.3)
lymph#: 1.2 10*3/uL (ref 0.9–3.3)
nRBC: 4 % — ABNORMAL HIGH (ref 0–0)

## 2016-09-21 MED ORDER — DARBEPOETIN ALFA 100 MCG/0.5ML IJ SOSY
100.0000 ug | PREFILLED_SYRINGE | Freq: Once | INTRAMUSCULAR | Status: AC
  Administered 2016-09-21: 100 ug via SUBCUTANEOUS
  Filled 2016-09-21: qty 0.5

## 2016-09-21 NOTE — Telephone Encounter (Signed)
Appointments scheduled per 11/13 LOS. Patient given AVS report and calendars with future scheduled appointments. °

## 2016-09-21 NOTE — Patient Instructions (Signed)
Darbepoetin Alfa injection What is this medicine? DARBEPOETIN ALFA (dar be POE e tin AL fa) helps your body make more red blood cells. It is used to treat anemia caused by chronic kidney failure and chemotherapy. This medicine may be used for other purposes; ask your health care provider or pharmacist if you have questions. What should I tell my health care provider before I take this medicine? They need to know if you have any of these conditions: -blood clotting disorders or history of blood clots -cancer patient not on chemotherapy -cystic fibrosis -heart disease, such as angina, heart failure, or a history of a heart attack -hemoglobin level of 12 g/dL or greater -high blood pressure -low levels of folate, iron, or vitamin B12 -seizures -an unusual or allergic reaction to darbepoetin, erythropoietin, albumin, hamster proteins, latex, other medicines, foods, dyes, or preservatives -pregnant or trying to get pregnant -breast-feeding How should I use this medicine? This medicine is for injection into a vein or under the skin. It is usually given by a health care professional in a hospital or clinic setting. If you get this medicine at home, you will be taught how to prepare and give this medicine. Do not shake the solution before you withdraw a dose. Use exactly as directed. Take your medicine at regular intervals. Do not take your medicine more often than directed. It is important that you put your used needles and syringes in a special sharps container. Do not put them in a trash can. If you do not have a sharps container, call your pharmacist or healthcare provider to get one. Talk to your pediatrician regarding the use of this medicine in children. While this medicine may be used in children as young as 1 year for selected conditions, precautions do apply. Overdosage: If you think you have taken too much of this medicine contact a poison control center or emergency room at once. NOTE:  This medicine is only for you. Do not share this medicine with others. What if I miss a dose? If you miss a dose, take it as soon as you can. If it is almost time for your next dose, take only that dose. Do not take double or extra doses. What may interact with this medicine? Do not take this medicine with any of the following medications: -epoetin alfa This list may not describe all possible interactions. Give your health care provider a list of all the medicines, herbs, non-prescription drugs, or dietary supplements you use. Also tell them if you smoke, drink alcohol, or use illegal drugs. Some items may interact with your medicine. What should I watch for while using this medicine? Visit your prescriber or health care professional for regular checks on your progress and for the needed blood tests and blood pressure measurements. It is especially important for the doctor to make sure your hemoglobin level is in the desired range, to limit the risk of potential side effects and to give you the best benefit. Keep all appointments for any recommended tests. Check your blood pressure as directed. Ask your doctor what your blood pressure should be and when you should contact him or her. As your body makes more red blood cells, you may need to take iron, folic acid, or vitamin B supplements. Ask your doctor or health care provider which products are right for you. If you have kidney disease continue dietary restrictions, even though this medication can make you feel better. Talk with your doctor or health care professional about the   foods you eat and the vitamins that you take. What side effects may I notice from receiving this medicine? Side effects that you should report to your doctor or health care professional as soon as possible: -allergic reactions like skin rash, itching or hives, swelling of the face, lips, or tongue -breathing problems -changes in vision -chest pain -confusion, trouble speaking  or understanding -feeling faint or lightheaded, falls -high blood pressure -muscle aches or pains -pain, swelling, warmth in the leg -rapid weight gain -severe headaches -sudden numbness or weakness of the face, arm or leg -trouble walking, dizziness, loss of balance or coordination -seizures (convulsions) -swelling of the ankles, feet, hands -unusually weak or tired Side effects that usually do not require medical attention (report to your doctor or health care professional if they continue or are bothersome): -diarrhea -fever, chills (flu-like symptoms) -headaches -nausea, vomiting -redness, stinging, or swelling at site where injected This list may not describe all possible side effects. Call your doctor for medical advice about side effects. You may report side effects to FDA at 1-800-FDA-1088. Where should I keep my medicine? Keep out of the reach of children. Store in a refrigerator between 2 and 8 degrees C (36 and 46 degrees F). Do not freeze. Do not shake. Throw away any unused portion if using a single-dose vial. Throw away any unused medicine after the expiration date. NOTE: This sheet is a summary. It may not cover all possible information. If you have questions about this medicine, talk to your doctor, pharmacist, or health care provider.    2016, [employer]. (2008-10-09 10:23:57)  

## 2016-09-24 DIAGNOSIS — I10 Essential (primary) hypertension: Secondary | ICD-10-CM | POA: Diagnosis not present

## 2016-09-24 LAB — CBC AND DIFFERENTIAL
HCT: 26 % — AB (ref 36–46)
Hemoglobin: 8.1 g/dL — AB (ref 12.0–16.0)
Platelets: 273 10*3/uL (ref 150–399)
WBC: 4.9 10*3/mL

## 2016-09-29 DIAGNOSIS — H401123 Primary open-angle glaucoma, left eye, severe stage: Secondary | ICD-10-CM | POA: Diagnosis not present

## 2016-09-29 DIAGNOSIS — H401112 Primary open-angle glaucoma, right eye, moderate stage: Secondary | ICD-10-CM | POA: Diagnosis not present

## 2016-09-30 ENCOUNTER — Telehealth: Payer: Self-pay | Admitting: *Deleted

## 2016-09-30 NOTE — Telephone Encounter (Signed)
"  Helene Kelp with [facility] calling to verify receipt of labs faxed on [phone]."  No labs in [place].  Suggested re-fax to [phone].  No further questions.

## 2016-10-06 LAB — CBC AND DIFFERENTIAL
HEMATOCRIT: 31 % — AB (ref 36–46)
HEMOGLOBIN: 9.8 g/dL — AB (ref 12.0–16.0)
PLATELETS: 379 10*3/uL (ref 150–399)
WBC: 6 10*3/mL

## 2016-10-08 ENCOUNTER — Other Ambulatory Visit: Payer: Self-pay | Admitting: *Deleted

## 2016-10-14 ENCOUNTER — Encounter: Payer: Self-pay | Admitting: Nurse Practitioner

## 2016-10-14 ENCOUNTER — Non-Acute Institutional Stay: Payer: Medicare Other | Admitting: Nurse Practitioner

## 2016-10-14 DIAGNOSIS — I10 Essential (primary) hypertension: Secondary | ICD-10-CM

## 2016-10-14 DIAGNOSIS — D473 Essential (hemorrhagic) thrombocythemia: Secondary | ICD-10-CM

## 2016-10-14 DIAGNOSIS — D649 Anemia, unspecified: Secondary | ICD-10-CM | POA: Diagnosis not present

## 2016-10-14 DIAGNOSIS — F0390 Unspecified dementia without behavioral disturbance: Secondary | ICD-10-CM | POA: Diagnosis not present

## 2016-10-14 DIAGNOSIS — F4323 Adjustment disorder with mixed anxiety and depressed mood: Secondary | ICD-10-CM | POA: Diagnosis not present

## 2016-10-14 NOTE — Assessment & Plan Note (Signed)
12/16/15 MMSE 24/30, 25/30, continue AL for care needs. Supervision for safety 02/20/16 MMSE 24/30. Failed clock drawing Continue Aricept, Namenda

## 2016-10-14 NOTE — Assessment & Plan Note (Signed)
Continue Agrylin 0.5mg  bid, last Plt 379 10/06/16

## 2016-10-14 NOTE — Assessment & Plan Note (Signed)
Controlled, continue Atenolol 25mg bid.  

## 2016-10-14 NOTE — Assessment & Plan Note (Signed)
Improved, Hgb 9.8 10/06/16, had Darbepoetin 161mc 09/21/16

## 2016-10-14 NOTE — Progress Notes (Signed)
Location:  [facility] Room Number: [phone] Place of Service:  ALF [phone]) Provider: Marlana Latus NP  Patient Care Team: Estill Dooms, MD as PCP - General (Internal Medicine) [place] Jaymen Fetch Otho Darner, NP as Nurse Practitioner (Nurse Practitioner) Truitt Merle, MD as Consulting Physician (Hematology)  Extended Emergency Contact Information Primary Emergency Contact: [name] [place] of [place] Phone: [phone] Relation: Friend Secondary Emergency Contact: [name] States of [place] Phone: [phone] Mobile Phone: [phone] Relation: Son  Code Status:  DNR Goals of care: Advanced Directive information Advanced Directives 10/14/2016  Does Patient Have a Medical Advance Directive? Yes  Type of Paramedic of [place];Living will  Does patient want to make changes to medical advance directive? -  Copy of [place] in Chart? Yes  Would patient like information on creating a medical advance directive? -  Pre-existing out of facility DNR order (yellow form or pink MOST form) -     Chief Complaint  Patient presents with  . Medical Management of Chronic Issues    routine visit    HPI:  Pt is a 82 y.o. female seen today for medical management of chronic diseases.     Anemia, Hgb 7.9 09/10/16,  09/21/16 Darbepoetin alfa 146mcg inj, Hgb 9.8 10/06/16   Hx of HTN, controlled on Atenolol 25mg  bid, essential thrombocythemia, takes Agrylin 0.5mg  bid, plt 379 10/06/16,  memory, impaired short terms mostly, talking Namenda, Aricept, resides in AL for care need, mood and weight are managed with Mirtazapine 7.5mg  Past Medical History:  Diagnosis Date  . Abdominal pain, unspecified site 08/11/2012  . Anemia, unspecified 06/18/2011  . Arthritis   . BBB (bundle branch block) 03/03/2006  . Cardiac dysrhythmia, unspecified 03/03/2006  . Chronic interstitial cystitis 03/03/2006  .  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 Disorder of bone and cartilage, unspecified 04/20/2007  . Diverticulosis of colon (without mention of hemorrhage) 04/20/2007  . Essential thrombocythemia ([place]) 06/18/2011  . Glaucoma   . Hematuria, unspecified 04/22/2004  . Hemorrhage of gastrointestinal tract, unspecified 03/09/2007  . Herpes zoster without mention of complication [id number]  . Hypertension   . 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 Loss of weight 06/18/2011  . Lumbago 12/18/2010  . Memory loss 12/19/2009  . Neck fracture ([place])   . Osteoarthrosis, unspecified whether generalized or localized, unspecified site 03/03/2006  . Other and unspecified hyperlipidemia 09/01/2006  . Sciatica 06/19/2010   Past Surgical History:  Procedure Laterality Date  . ABDOMINAL HYSTERECTOMY    . APPENDECTOMY    . BREAST CYST EXCISION Bilateral    benign  . CATARACT EXTRACTION W/ INTRAOCULAR LENS  IMPLANT, BILATERAL    . TONSILLECTOMY      Allergies  Allergen Reactions  . Darvocet [Propoxyphene N-Acetaminophen] Nausea And Vomiting  . Darvon Nausea And Vomiting  . Meperidine And Related Nausea And Vomiting  . Vicodin [Hydrocodone-Acetaminophen] Nausea And Vomiting  . Demerol [Meperidine]       Medication List       Accurate as of 10/14/16 12:16 PM. Always use your most recent med list.          anagrelide 1 MG capsule Commonly known as:  AGRYLIN Take 1 mg by mouth as directed. Take 3 mg in  AM  ;  And continue  2 mg in  PM.  As of  06/18/16.   aspirin 81 MG tablet Take 81 mg by mouth daily.   atenolol 25 MG  tablet Commonly known as:  TENORMIN Take 25 mg by mouth 2 (two) times daily.   calcitonin (salmon) 200 UNIT/ACT nasal spray Commonly known as:  MIACALCIN/FORTICAL Place 1 spray into alternate nostrils daily.   CALCIUM 600/VITAMIN D 600-400 MG-UNIT Tabs Generic drug:  Calcium Carbonate-Vitamin D3 Take 1 tablet by mouth 2 (two) times daily with a meal.   donepezil 10 MG  tablet Commonly known as:  ARICEPT Take one tablet at bedtime for memory   dorzolamide-timolol 22.3-6.8 MG/ML ophthalmic solution Commonly known as:  COSOPT Place 1 drop into both eyes 2 (two) times daily. 2% - 0.5%.   feeding supplement Liqd Take 90 mLs by mouth 2 (two) times daily between meals.   flurbiprofen 100 MG tablet Commonly known as:  ANSAID TAKE 1 TABLET BY MOUTH EVERY DAY   latanoprost 0.005 % ophthalmic solution Commonly known as:  XALATAN Place 1 drop into both eyes at bedtime.   LORazepam 1 MG tablet Commonly known as:  ATIVAN Take 0.25 mg by mouth as needed for sleep.   Magnesium 250 MG Tabs Take 1 tablet by mouth daily.   memantine 10 MG tablet Commonly known as:  NAMENDA One twice daily to help preserve memory   mirtazapine 15 MG tablet Commonly known as:  REMERON Take 15 mg by mouth at bedtime.   URELLE 81 MG Tabs tablet Take 1 tablet by mouth at bedtime as needed.   vitamin E 400 UNIT capsule Take 400 Units by mouth daily.       Review of Systems  Constitutional: Negative for activity change, appetite change, chills, diaphoresis, fatigue, fever and unexpected weight change.  HENT: Positive for voice change (hoarse since September 2014). Negative for congestion, ear discharge, ear pain, hearing loss, postnasal drip, rhinorrhea, sore throat, tinnitus and trouble swallowing.   Eyes: Negative.  Negative for pain, redness, itching and visual disturbance.  Respiratory: Negative.  Negative for cough, choking, shortness of breath and wheezing.   Cardiovascular: Negative.  Negative for chest pain, palpitations and leg swelling.  Gastrointestinal: Negative.  Negative for abdominal distention, abdominal pain, constipation, diarrhea and nausea.  Endocrine: Negative.  Negative for cold intolerance, heat intolerance, polydipsia, polyphagia and polyuria.  Genitourinary: Negative.  Negative for difficulty urinating, dysuria, flank pain, frequency, hematuria,  pelvic pain, urgency and vaginal discharge.  Musculoskeletal: Negative.  Negative for arthralgias, back pain, gait problem, myalgias, neck pain and neck stiffness.  Skin: Positive for wound. Negative for color change, pallor and rash.  Allergic/Immunologic: Negative.   Neurological: Negative for dizziness, tremors, seizures, syncope, weakness, numbness and headaches.       Memory loss  Hematological: Negative for adenopathy. Does not bruise/bleed easily.       Thrombocythemia  Psychiatric/Behavioral: Negative.  Negative for agitation, behavioral problems, confusion, dysphoric mood, hallucinations, sleep disturbance and suicidal ideas. The patient is not nervous/anxious and is not hyperactive.     Immunization History  Administered Date(s) Administered  . Influenza Whole 08/09/2012, 07/10/2013  . Influenza-Unspecified 08/14/2014, 08/08/2015, 08/26/2016  . Pneumococcal Polysaccharide-23 11/09/2004  . Zoster 10/07/2006   Pertinent  Health Maintenance Due  Topic Date Due  . PNA vac Low Risk Adult (2 of 2 - PCV13) 11/09/2005  . INFLUENZA VACCINE  Completed  . DEXA SCAN  Completed   Fall Risk  02/27/2016 12/19/2015 08/22/2015 02/21/2015 09/07/2014  Falls in the past year? No No No No No   Functional Status Survey:    Vitals:   10/14/16 1124  BP: 124/70  Pulse: 80  Resp: 20  Temp: 98.2 F (36.8 C)  Weight: 97 lb 9.6 oz (44.3 kg)  Height: 5\' 1"  (1.549 m)   Body mass index is 18.44 kg/m. Physical Exam  Constitutional: She is oriented to person, place, and time. She appears well-developed and well-nourished. No distress.  HENT:  Head: Normocephalic and atraumatic.  Right Ear: External ear normal.  Left Ear: External ear normal.  Nose: Nose normal.  Mouth/Throat: Oropharynx is clear and moist.  Eyes: Conjunctivae and EOM are normal. Pupils are equal, round, and reactive to light.  Corrective lenses  Neck: Neck supple. No JVD present. No tracheal deviation present. No thyromegaly  present.  Cardiovascular: Normal rate and regular rhythm.  Exam reveals no gallop and no friction rub.   Murmur (2/6/LSB SEM) heard. Pulmonary/Chest: No respiratory distress. She has no wheezes. She has no rales.  Abdominal: She exhibits no distension and no mass. There is no tenderness.  Musculoskeletal: She exhibits no edema or tenderness.  Heberden's nodes. Bouchard's nodes. Contracture of the right hand.  Lymphadenopathy:    She has no cervical adenopathy.  Neurological: She is alert and oriented to person, place, and time. She has normal reflexes. No cranial nerve deficit. Coordination normal.  08/22/2015 MMSE 28/30. Failed clock drawing. 09/17/16 MMSE 25/30. Failed clock drawing.  Skin: No rash noted. No erythema. No pallor.  Psychiatric: She has a normal mood and affect. Her behavior is normal. Judgment and thought content normal.    Labs reviewed:  Recent Labs  12/12/15 1732  01/08/16 0917 03/24/16 0838  06/18/16 1013 07/14/16 09/21/16 1018  NA 143  < > 140 142  < > 144 142 147*  K 3.6  < > 3.8 4.3  < > 4.1 4.4 4.3  CL 108  --  107  --   --   --   --   --   CO2 23  < > 24 27  --  27  --  27  GLUCOSE 158*  < > 105* 94  --  87  --  90  BUN 26*  < > 21* 21.0  < > 26.2* 31* 28.5*  CREATININE 1.10*  < > 1.08* 0.9  < > 1.1 1.0 1.3*  CALCIUM 8.8*  < > 8.5* 8.8  --  9.1  --  9.1  < > = values in this interval not displayed.  Recent Labs  03/24/16 0838  06/18/16 1013 07/14/16 09/01/16 1145 09/21/16 1018  AST 16  < > 17 12*  --  17  ALT <9  < > <9 5*  --  8  ALKPHOS 82  < > 73 51  --  73  BILITOT 0.30  --  0.34  --   --  0.47  PROT 6.3*  --  6.8  --  6.6 6.5  ALBUMIN 3.2*  --  3.5  --   --  3.4*  < > = values in this interval not displayed.  Recent Labs  06/18/16 1013  09/01/16 0902  09/21/16 1018 09/24/16 10/06/16  WBC 5.4  < > 5.6  < > 5.8 4.9 6.0  NEUTROABS 3.7  --  3.7  --  3.7  --   --   HGB 9.9*  < > 9.0*  < > 8.6* 8.1* 9.8*  HCT 31.5*  < > 29.5*  < > 28.1*  26* 31*  MCV 92.4  --  93.9  --  95.6  --   --   PLT 491*  < >  253  < > 312 273 379  < > = values in this interval not displayed. No results found for: TSH No results found for: HGBA1C Lab Results  Component Value Date   CHOL 174 07/14/2016   HDL 59 07/14/2016   LDLCALC 92 07/14/2016   TRIG 117 07/14/2016    Significant Diagnostic Results in last 30 days:  No results found.  Assessment/Plan Hypertension Controlled, continue Atenolol 25mg  bid  Dementia 12/16/15 MMSE 24/30, 25/30, continue AL for care needs. Supervision for safety 02/20/16 MMSE 24/30. Failed clock drawing Continue Aricept, Namenda   Essential thrombocythemia ([place]) Continue Agrylin 0.5mg  bid, last Plt 379 10/06/16  Anemia Improved, Hgb 9.8 10/06/16, had Darbepoetin 142mc 09/21/16  Adjustment disorder with mixed anxiety and depressed mood Stable. Continue observe the patient.  Off Sertraline due to weakness C/o feeling no energy, poor appetite, better with  Mirtazapine 7.5mg  daily in setting of weigh loss      Family/ staff Communication: AL  Labs/tests ordered: none

## 2016-10-14 NOTE — Assessment & Plan Note (Signed)
Stable. Continue observe the patient.  Off Sertraline due to weakness C/o feeling no energy, poor appetite, better with  Mirtazapine 7.5mg  daily in setting of weigh loss

## 2016-10-19 ENCOUNTER — Other Ambulatory Visit (HOSPITAL_BASED_OUTPATIENT_CLINIC_OR_DEPARTMENT_OTHER): Payer: Medicare Other

## 2016-10-19 ENCOUNTER — Ambulatory Visit (HOSPITAL_BASED_OUTPATIENT_CLINIC_OR_DEPARTMENT_OTHER): Payer: Medicare Other

## 2016-10-19 VITALS — BP 145/63 | HR 69 | Temp 98.1°F | Resp 18

## 2016-10-19 DIAGNOSIS — N189 Chronic kidney disease, unspecified: Secondary | ICD-10-CM

## 2016-10-19 DIAGNOSIS — D631 Anemia in chronic kidney disease: Secondary | ICD-10-CM

## 2016-10-19 DIAGNOSIS — D473 Essential (hemorrhagic) thrombocythemia: Secondary | ICD-10-CM | POA: Diagnosis present

## 2016-10-19 DIAGNOSIS — D649 Anemia, unspecified: Secondary | ICD-10-CM

## 2016-10-19 LAB — CBC & DIFF AND RETIC
BASO%: 1.1 % (ref 0.0–2.0)
BASOS ABS: 0.1 10*3/uL (ref 0.0–0.1)
EOS ABS: 0.1 10*3/uL (ref 0.0–0.5)
EOS%: 1.1 % (ref 0.0–7.0)
HCT: 29.7 % — ABNORMAL LOW (ref 34.8–46.6)
HEMOGLOBIN: 9.1 g/dL — AB (ref 11.6–15.9)
Immature Retic Fract: 22.8 % — ABNORMAL HIGH (ref 1.60–10.00)
LYMPH#: 1.3 10*3/uL (ref 0.9–3.3)
LYMPH%: 23.6 % (ref 14.0–49.7)
MCH: 29.8 pg (ref 25.1–34.0)
MCHC: 30.6 g/dL — ABNORMAL LOW (ref 31.5–36.0)
MCV: 97.4 fL (ref 79.5–101.0)
MONO#: 0.6 10*3/uL (ref 0.1–0.9)
MONO%: 10.5 % (ref 0.0–14.0)
NEUT#: 3.4 10*3/uL (ref 1.5–6.5)
NEUT%: 63.7 % (ref 38.4–76.8)
NRBC: 2 % — AB (ref 0–0)
Platelets: 326 10*3/uL (ref 145–400)
RBC: 3.05 10*6/uL — ABNORMAL LOW (ref 3.70–5.45)
RDW: 18.1 % — AB (ref 11.2–14.5)
RETIC %: 1.64 % (ref 0.70–2.10)
RETIC CT ABS: 50.02 10*3/uL (ref 33.70–90.70)
WBC: 5.3 10*3/uL (ref 3.9–10.3)

## 2016-10-19 LAB — COMPREHENSIVE METABOLIC PANEL
ALBUMIN: 3.2 g/dL — AB (ref 3.5–5.0)
ALK PHOS: 63 U/L (ref 40–150)
ALT: 10 U/L (ref 0–55)
AST: 16 U/L (ref 5–34)
Anion Gap: 9 mEq/L (ref 3–11)
BUN: 31.4 mg/dL — AB (ref 7.0–26.0)
CHLORIDE: 112 meq/L — AB (ref 98–109)
CO2: 26 mEq/L (ref 22–29)
Calcium: 9 mg/dL (ref 8.4–10.4)
Creatinine: 1.2 mg/dL — ABNORMAL HIGH (ref 0.6–1.1)
EGFR: 41 mL/min/{1.73_m2} — ABNORMAL LOW (ref >90)
GLUCOSE: 138 mg/dL (ref 70–140)
POTASSIUM: 4 meq/L (ref 3.5–5.1)
SODIUM: 148 meq/L — AB (ref 136–145)
Total Bilirubin: 0.35 mg/dL (ref 0.20–1.20)
Total Protein: 6.1 g/dL — ABNORMAL LOW (ref 6.4–8.3)

## 2016-10-19 MED ORDER — DARBEPOETIN ALFA 150 MCG/0.3ML IJ SOSY
150.0000 ug | PREFILLED_SYRINGE | Freq: Once | INTRAMUSCULAR | Status: AC
  Administered 2016-10-19: 150 ug via SUBCUTANEOUS
  Filled 2016-10-19: qty 0.3

## 2016-10-19 NOTE — Patient Instructions (Signed)

## 2016-10-20 DIAGNOSIS — I10 Essential (primary) hypertension: Secondary | ICD-10-CM | POA: Diagnosis not present

## 2016-10-20 LAB — CBC AND DIFFERENTIAL
HCT: 30 % — AB (ref 36–46)
HEMOGLOBIN: 9.6 g/dL — AB (ref 12.0–16.0)
Platelets: 379 10*3/uL (ref 150–399)
WBC: 5.5 10^3/mL

## 2016-10-22 ENCOUNTER — Other Ambulatory Visit: Payer: Self-pay | Admitting: *Deleted

## 2016-10-28 ENCOUNTER — Encounter: Payer: Self-pay | Admitting: Nurse Practitioner

## 2016-10-28 ENCOUNTER — Non-Acute Institutional Stay: Payer: Medicare Other | Admitting: Nurse Practitioner

## 2016-10-28 DIAGNOSIS — D473 Essential (hemorrhagic) thrombocythemia: Secondary | ICD-10-CM | POA: Diagnosis not present

## 2016-10-28 DIAGNOSIS — F02818 Dementia in other diseases classified elsewhere, unspecified severity, with other behavioral disturbance: Secondary | ICD-10-CM

## 2016-10-28 DIAGNOSIS — I10 Essential (primary) hypertension: Secondary | ICD-10-CM | POA: Diagnosis not present

## 2016-10-28 DIAGNOSIS — R413 Other amnesia: Secondary | ICD-10-CM | POA: Diagnosis not present

## 2016-10-28 DIAGNOSIS — F4323 Adjustment disorder with mixed anxiety and depressed mood: Secondary | ICD-10-CM | POA: Diagnosis not present

## 2016-10-28 DIAGNOSIS — D649 Anemia, unspecified: Secondary | ICD-10-CM | POA: Diagnosis not present

## 2016-10-28 DIAGNOSIS — F0281 Dementia in other diseases classified elsewhere with behavioral disturbance: Secondary | ICD-10-CM | POA: Diagnosis not present

## 2016-10-28 NOTE — Assessment & Plan Note (Signed)
Controlled, continue Atenolol 25mg bid.  

## 2016-10-28 NOTE — Assessment & Plan Note (Signed)
12/16/15 MMSE 24/30, 25/30, continue AL for care needs. Supervision for safety 02/20/16 MMSE 24/30. Failed clock drawing Continue Aricept, Namenda

## 2016-10-28 NOTE — Progress Notes (Signed)
Location:  [facility] Room Number: [id number] Place of Service:  ALF [phone]) Provider:  Axten Pascucci, Manxie  NP  [name], MD  Patient Care Team: Estill Dooms, MD as PCP - General (Internal Medicine) [place] Murriel Eidem Otho Darner, NP as Nurse Practitioner (Nurse Practitioner) Truitt Merle, MD as Consulting Physician (Hematology)  Extended Emergency Contact Information Primary Emergency Contact: [name] States of [place] Phone: [phone] Relation: Friend Secondary Emergency Contact: [name] States of [place] Phone: [phone] Mobile Phone: [phone] Relation: Son  Code Status:  DNR Goals of care: Advanced Directive information Advanced Directives 10/28/2016  Does Patient Have a Medical Advance Directive? Yes  Type of Paramedic of [place];Living will  Does patient want to make changes to medical advance directive? No - Patient declined  Copy of [place] in Chart? Yes  Would patient like information on creating a medical advance directive? -  Pre-existing out of facility DNR order (yellow form or pink MOST form) Yellow form placed in chart (order not valid for inpatient use)     Chief Complaint  Patient presents with  . Acute Visit    Increased confusion and agitation    HPI:  Pt is a 82 y.o. female seen today for an acute visit for increased confusion and agitation. Afebrile, denied chest pain, cough, palpitation, nausea, vomiting, constipation, or diarrhea.     Anemia, Hgb 9.1 10/19/16, 10/19/16 Darbepoetin alfa 157mcg inj              Hx of HTN, controlled on Atenolol 25mg  bid, essential thrombocythemia, takes Agrylin 0.5mg  bid, plt 326 10/19/16,  memory, impaired short terms mostly, talking Namenda, Aricept, resides in AL for care need, mood and weight are managed with Mirtazapine 7.5mg   Past Medical History:  Diagnosis Date  . Abdominal pain, unspecified  site 08/11/2012  . Anemia, unspecified 06/18/2011  . Arthritis   . BBB (bundle branch block) 03/03/2006  . Cardiac dysrhythmia, unspecified 03/03/2006  . Chronic interstitial cystitis 03/03/2006  . 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 Disorder of bone and cartilage, unspecified 04/20/2007  . Diverticulosis of colon (without mention of hemorrhage) 04/20/2007  . Essential thrombocythemia ([place]) 06/18/2011  . Glaucoma   . Hematuria, unspecified 04/22/2004  . Hemorrhage of gastrointestinal tract, unspecified 03/09/2007  . Herpes zoster without mention of complication [id number]  . Hypertension   . 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 Loss of weight 06/18/2011  . Lumbago 12/18/2010  . Memory loss 12/19/2009  . Neck fracture ([place])   . Osteoarthrosis, unspecified whether generalized or localized, unspecified site 03/03/2006  . Other and unspecified hyperlipidemia 09/01/2006  . Sciatica 06/19/2010   Past Surgical History:  Procedure Laterality Date  . ABDOMINAL HYSTERECTOMY    . APPENDECTOMY    . BREAST CYST EXCISION Bilateral    benign  . CATARACT EXTRACTION W/ INTRAOCULAR LENS  IMPLANT, BILATERAL    . TONSILLECTOMY      Allergies  Allergen Reactions  . Darvocet [Propoxyphene N-Acetaminophen] Nausea And Vomiting  . Darvon Nausea And Vomiting  . Meperidine And Related Nausea And Vomiting  . Vicodin [Hydrocodone-Acetaminophen] Nausea And Vomiting  . Demerol [Meperidine]     Allergies as of 10/28/2016      Reactions   Darvocet [propoxyphene N-acetaminophen] Nausea And Vomiting   Darvon Nausea And Vomiting   Meperidine And Related Nausea And Vomiting   Vicodin [hydrocodone-acetaminophen] Nausea And Vomiting   Demerol [meperidine]  Medication List       Accurate as of 10/28/16 12:40 PM. Always use your most recent med list.          anagrelide 1 MG capsule Commonly known as:  AGRYLIN Take 1 mg by mouth as directed. Take 3 mg in  AM  ;  And continue  2 mg in  PM.  As of   06/18/16.   aspirin 81 MG tablet Take 81 mg by mouth daily.   atenolol 25 MG tablet Commonly known as:  TENORMIN Take 25 mg by mouth 2 (two) times daily.   calcitonin (salmon) 200 UNIT/ACT nasal spray Commonly known as:  MIACALCIN/FORTICAL Place 1 spray into alternate nostrils daily.   CALCIUM 600/VITAMIN D 600-400 MG-UNIT Tabs Generic drug:  Calcium Carbonate-Vitamin D3 Take 1 tablet by mouth 2 (two) times daily with a meal.   donepezil 10 MG tablet Commonly known as:  ARICEPT Take one tablet at bedtime for memory   dorzolamide-timolol 22.3-6.8 MG/ML ophthalmic solution Commonly known as:  COSOPT Place 1 drop into both eyes 2 (two) times daily. 2% - 0.5%.   feeding supplement Liqd Take 90 mLs by mouth 2 (two) times daily between meals.   flurbiprofen 100 MG tablet Commonly known as:  ANSAID TAKE 1 TABLET BY MOUTH EVERY DAY   latanoprost 0.005 % ophthalmic solution Commonly known as:  XALATAN Place 1 drop into both eyes at bedtime.   LORazepam 1 MG tablet Commonly known as:  ATIVAN Take 0.25 mg by mouth as needed for sleep.   Magnesium 250 MG Tabs Take 1 tablet by mouth daily.   memantine 10 MG tablet Commonly known as:  NAMENDA One twice daily to help preserve memory   mirtazapine 15 MG tablet Commonly known as:  REMERON Take 15 mg by mouth at bedtime.   URELLE 81 MG Tabs tablet Take 1 tablet by mouth at bedtime as needed.   vitamin E 400 UNIT capsule Take 400 Units by mouth daily.       Review of Systems  Constitutional: Negative for activity change, appetite change, chills, diaphoresis, fatigue, fever and unexpected weight change.  HENT: Positive for voice change (hoarse since September 2014). Negative for congestion, ear discharge, ear pain, hearing loss, postnasal drip, rhinorrhea, sore throat, tinnitus and trouble swallowing.   Eyes: Negative.  Negative for pain, redness, itching and visual disturbance.  Respiratory: Negative.  Negative for cough,  choking, shortness of breath and wheezing.   Cardiovascular: Negative.  Negative for chest pain, palpitations and leg swelling.  Gastrointestinal: Negative.  Negative for abdominal distention, abdominal pain, constipation, diarrhea and nausea.  Endocrine: Negative.  Negative for cold intolerance, heat intolerance, polydipsia, polyphagia and polyuria.  Genitourinary: Negative.  Negative for difficulty urinating, dysuria, flank pain, frequency, hematuria, pelvic pain, urgency and vaginal discharge.  Musculoskeletal: Negative.  Negative for arthralgias, back pain, gait problem, myalgias, neck pain and neck stiffness.  Skin: Positive for wound. Negative for color change, pallor and rash.  Allergic/Immunologic: Negative.   Neurological: Negative for dizziness, tremors, seizures, syncope, weakness, numbness and headaches.       Memory loss  Hematological: Negative for adenopathy. Does not bruise/bleed easily.       Thrombocythemia  Psychiatric/Behavioral: Negative.  Negative for agitation, behavioral problems, confusion, dysphoric mood, hallucinations, sleep disturbance and suicidal ideas. The patient is not nervous/anxious and is not hyperactive.     Immunization History  Administered Date(s) Administered  . Influenza Whole 08/09/2012, 07/10/2013  . Influenza-Unspecified 08/14/2014, 08/08/2015, 08/26/2016  .  Pneumococcal Polysaccharide-23 11/09/2004  . Zoster 10/07/2006   Pertinent  Health Maintenance Due  Topic Date Due  . PNA vac Low Risk Adult (2 of 2 - PCV13) 11/09/2005  . INFLUENZA VACCINE  Completed  . DEXA SCAN  Completed   Fall Risk  02/27/2016 12/19/2015 08/22/2015 02/21/2015 09/07/2014  Falls in the past year? No No No No No   Functional Status Survey:    Vitals:   10/28/16 1042  BP: 130/76  Pulse: 82  Resp: (!) 22  Temp: 98.4 F (36.9 C)  Weight: 97 lb 9.6 oz (44.3 kg)  Height: 5\' 1"  (1.549 m)   Body mass index is 18.44 kg/m. Physical Exam  Constitutional: She is  oriented to person, place, and time. She appears well-developed and well-nourished. No distress.  HENT:  Head: Normocephalic and atraumatic.  Right Ear: External ear normal.  Left Ear: External ear normal.  Nose: Nose normal.  Mouth/Throat: Oropharynx is clear and moist.  Eyes: Conjunctivae and EOM are normal. Pupils are equal, round, and reactive to light.  Corrective lenses  Neck: Neck supple. No JVD present. No tracheal deviation present. No thyromegaly present.  Cardiovascular: Normal rate and regular rhythm.  Exam reveals no gallop and no friction rub.   Murmur (2/6/LSB SEM) heard. Pulmonary/Chest: No respiratory distress. She has no wheezes. She has no rales.  Abdominal: She exhibits no distension and no mass. There is no tenderness.  Musculoskeletal: She exhibits no edema or tenderness.  Heberden's nodes. Bouchard's nodes. Contracture of the right hand.  Lymphadenopathy:    She has no cervical adenopathy.  Neurological: She is alert and oriented to person, place, and time. She has normal reflexes. No cranial nerve deficit. Coordination normal.  08/22/2015 MMSE 28/30. Failed clock drawing. 09/17/16 MMSE 25/30. Failed clock drawing.  Skin: No rash noted. No erythema. No pallor.  Psychiatric: She has a normal mood and affect. Her behavior is normal. Judgment and thought content normal.    Labs reviewed:  Recent Labs  12/12/15 1732  01/08/16 0917  06/18/16 1013 07/14/16 09/21/16 1018 10/19/16 0815  NA 143  < > 140  < > 144 142 147* 148*  K 3.6  < > 3.8  < > 4.1 4.4 4.3 4.0  CL 108  --  107  --   --   --   --   --   CO2 23  < > 24  < > 27  --  27 26  GLUCOSE 158*  < > 105*  < > 87  --  90 138  BUN 26*  < > 21*  < > 26.2* 31* 28.5* 31.4*  CREATININE 1.10*  < > 1.08*  < > 1.1 1.0 1.3* 1.2*  CALCIUM 8.8*  < > 8.5*  < > 9.1  --  9.1 9.0  < > = values in this interval not displayed.  Recent Labs  06/18/16 1013 07/14/16 09/01/16 1145 09/21/16 1018 10/19/16 0815  AST 17  12*  --  17 16  ALT <9 5*  --  8 10  ALKPHOS 73 51  --  73 63  BILITOT 0.34  --   --  0.47 0.35  PROT 6.8  --  6.6 6.5 6.1*  ALBUMIN 3.5  --   --  3.4* 3.2*    Recent Labs  09/01/16 0902  09/21/16 1018  10/06/16 10/19/16 0815 10/20/16  WBC 5.6  < > 5.8  < > 6.0 5.3 5.5  NEUTROABS 3.7  --  3.7  --   --  3.4  --   HGB 9.0*  < > 8.6*  < > 9.8* 9.1* 9.6*  HCT 29.5*  < > 28.1*  < > 31* 29.7* 30*  MCV 93.9  --  95.6  --   --  97.4  --   PLT 253  < > 312  < > 379 326 379  < > = values in this interval not displayed. No results found for: TSH No results found for: HGBA1C Lab Results  Component Value Date   CHOL 174 07/14/2016   HDL 59 07/14/2016   LDLCALC 92 07/14/2016   TRIG 117 07/14/2016    Significant Diagnostic Results in last 30 days:  No results found.  Assessment/Plan Adjustment disorder with mixed anxiety and depressed mood Increased agitation and confusion, will try to increase Mirtazapine to 15mg . Observe.   Hypertension Controlled, continue Atenolol 25mg  bid   Dementia 12/16/15 MMSE 24/30, 25/30, continue AL for care needs. Supervision for safety 02/20/16 MMSE 24/30. Failed clock drawing Continue Aricept, Namenda  Essential thrombocythemia ([place]) Last plt 326 10/19/16, continue Agrylin.   Anemia Anemia, Hgb 9.1 10/19/16, 10/19/16 Darbepoetin alfa 139mcg inj  Memory loss 12/16/15 MMSE 24/30, 25/30, continue AL for care needs. Supervision for safety 02/20/16 MMSE 24/30. Failed clock drawing Continue Aricept, Namenda      Family/ staff Communication: AL  Labs/tests ordered:  none

## 2016-10-28 NOTE — Assessment & Plan Note (Signed)
Anemia, Hgb 9.1 10/19/16, 10/19/16 Darbepoetin alfa 1101mcg inj

## 2016-10-28 NOTE — Assessment & Plan Note (Signed)
Last plt 326 10/19/16, continue Agrylin.

## 2016-10-28 NOTE — Assessment & Plan Note (Signed)
Increased agitation and confusion, will try to increase Mirtazapine to 15mg . Observe.

## 2016-11-03 ENCOUNTER — Telehealth: Payer: Self-pay | Admitting: *Deleted

## 2016-11-03 DIAGNOSIS — I10 Essential (primary) hypertension: Secondary | ICD-10-CM | POA: Diagnosis not present

## 2016-11-03 DIAGNOSIS — D473 Essential (hemorrhagic) thrombocythemia: Secondary | ICD-10-CM | POA: Diagnosis not present

## 2016-11-03 LAB — BASIC METABOLIC PANEL
BUN: 30 mg/dL — AB (ref 4–21)
CREATININE: 1.1 mg/dL (ref <=1.1)
Glucose: 85 mg/dL
Potassium: 4.3 mmol/L (ref 3.4–5.3)
Sodium: 144 mmol/L (ref 137–147)

## 2016-11-03 LAB — CBC AND DIFFERENTIAL
HCT: 30 % — AB (ref 36–46)
Hemoglobin: 9.4 g/dL — AB (ref 12.0–16.0)
PLATELETS: 297 10*3/uL (ref 150–399)
WBC: 4.6 10*3/mL

## 2016-11-03 NOTE — Telephone Encounter (Signed)
Received  Faxed lab results  CBC, BMET  Done today from [facility].  Spoke with pt's nurse today Patience and was informed that pt is taking  :  Anagrelide  3 mg  8 am  ;   2 mg  8 pm.   Dr. Burr Medico reviewed results. Spoke with Patience again and informed her that  NO Change in Anagrelide dose as per md.   Patience voiced understanding.

## 2016-11-04 ENCOUNTER — Other Ambulatory Visit: Payer: Self-pay | Admitting: *Deleted

## 2016-11-04 NOTE — Progress Notes (Signed)
.  psc

## 2016-11-11 ENCOUNTER — Encounter: Payer: Self-pay | Admitting: Nurse Practitioner

## 2016-11-11 ENCOUNTER — Non-Acute Institutional Stay: Payer: Medicare Other | Admitting: Nurse Practitioner

## 2016-11-11 DIAGNOSIS — R627 Adult failure to thrive: Secondary | ICD-10-CM | POA: Diagnosis not present

## 2016-11-11 DIAGNOSIS — F0281 Dementia in other diseases classified elsewhere with behavioral disturbance: Secondary | ICD-10-CM | POA: Diagnosis not present

## 2016-11-11 DIAGNOSIS — K5904 Chronic idiopathic constipation: Secondary | ICD-10-CM

## 2016-11-11 DIAGNOSIS — I10 Essential (primary) hypertension: Secondary | ICD-10-CM

## 2016-11-11 DIAGNOSIS — F02818 Dementia in other diseases classified elsewhere, unspecified severity, with other behavioral disturbance: Secondary | ICD-10-CM

## 2016-11-11 DIAGNOSIS — D649 Anemia, unspecified: Secondary | ICD-10-CM | POA: Diagnosis not present

## 2016-11-11 DIAGNOSIS — D473 Essential (hemorrhagic) thrombocythemia: Secondary | ICD-10-CM

## 2016-11-11 DIAGNOSIS — F4323 Adjustment disorder with mixed anxiety and depressed mood: Secondary | ICD-10-CM | POA: Diagnosis not present

## 2016-11-11 DIAGNOSIS — K5900 Constipation, unspecified: Secondary | ICD-10-CM | POA: Insufficient documentation

## 2016-11-11 NOTE — Assessment & Plan Note (Signed)
Increased agitation and confusion, will increase Mirtazapine to 30mg . Observe.

## 2016-11-11 NOTE — Progress Notes (Signed)
Location:  [facility] Room Number: [id number] Place of Service:  ALF [phone]) Provider:  Joeann [name], Manxie  NP  [name] Hubert, MD  Patient Care Team: [name] Dooms, MD as PCP - General (Internal Medicine) [name] Lekita Kerekes Otho Darner, NP as Nurse Practitioner (Nurse Practitioner) [name] Merle, MD as Consulting Physician (Hematology)  Extended Emergency Contact Information Primary Emergency Contact: [name] [name] [place] of [place] Phone: [phone] Relation: Friend Secondary Emergency Contact: [name] [name] [place] of [place] Phone: [phone] Mobile Phone: [phone] Relation: Son  Code Status:  DNR Goals of care: Advanced Directive information Advanced Directives 11/11/2016  Does Patient Have a Medical Advance Directive? Yes  Type of Paramedic of [place];Living will;Out of facility DNR (pink MOST or yellow form)  Does patient want to make changes to medical advance directive? No - Patient declined  Copy of [place] in Chart? Yes  Would patient like information on creating a medical advance directive? -  Pre-existing out of facility DNR order (yellow form or pink MOST form) Yellow form placed in chart (order not valid for inpatient use)     Chief Complaint  Patient presents with  . Acute Visit    Weak, not feeling good, ? confusion    HPI:  Pt is a 83 y.o. female seen today for an acute visit for malaise, generalized weakness, deconditioning. 11/03/16 CBC BMP unremarkable, except Hgb 9.4.   10/19/16 Darbepoetin alfa 153mcg inj   Hx of HTN, controlled on Atenolol 25mg  bid, essential thrombocythemia, takes Agrylin 0.5mg  bid, plt 326 10/19/16, memory, impaired short terms mostly, talking Namenda, Aricept, resides in AL for care need, mood and weight are managed with Mirtazapine 7.5mg   Past Medical History:  Diagnosis Date  . Abdominal pain, unspecified site 08/11/2012  . Anemia,  unspecified 06/18/2011  . Arthritis   . BBB (bundle branch block) 03/03/2006  . Cardiac dysrhythmia, unspecified 03/03/2006  . Chronic interstitial cystitis 03/03/2006  . 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 Disorder of bone and cartilage, unspecified 04/20/2007  . Diverticulosis of colon (without mention of hemorrhage) 04/20/2007  . Essential thrombocythemia ([place]) 06/18/2011  . Glaucoma   . Hematuria, unspecified 04/22/2004  . Hemorrhage of gastrointestinal tract, unspecified 03/09/2007  . Herpes zoster without mention of complication [id number]  . Hypertension   . 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 Loss of weight 06/18/2011  . Lumbago 12/18/2010  . Memory loss 12/19/2009  . Neck fracture ([place])   . Osteoarthrosis, unspecified whether generalized or localized, unspecified site 03/03/2006  . Other and unspecified hyperlipidemia 09/01/2006  . Sciatica 06/19/2010   Past Surgical History:  Procedure Laterality Date  . ABDOMINAL HYSTERECTOMY    . APPENDECTOMY    . BREAST CYST EXCISION Bilateral    benign  . CATARACT EXTRACTION W/ INTRAOCULAR LENS  IMPLANT, BILATERAL    . TONSILLECTOMY      Allergies  Allergen Reactions  . Darvocet [Propoxyphene N-Acetaminophen] Nausea And Vomiting  . Darvon Nausea And Vomiting  . Meperidine And Related Nausea And Vomiting  . Vicodin [Hydrocodone-Acetaminophen] Nausea And Vomiting  . Demerol [Meperidine]     Allergies as of 11/11/2016      Reactions   Darvocet [propoxyphene N-acetaminophen] Nausea And Vomiting   Darvon Nausea And Vomiting   Meperidine And Related Nausea And Vomiting   Vicodin [hydrocodone-acetaminophen] Nausea And Vomiting   Demerol [meperidine]       Medication List       Accurate as  of 11/11/16  2:58 PM. Always use your most recent med list.          acetaminophen 325 MG tablet Commonly known as:  TYLENOL Take 650 mg by mouth every 4 (four) hours as needed for mild pain, fever or headache.   anagrelide 1 MG capsule Commonly  known as:  AGRYLIN Take 1 mg by mouth as directed. Take 3 mg in  AM  ;  And continue  2 mg in  PM.  As of  06/18/16.   aspirin 81 MG tablet Take 81 mg by mouth daily.   atenolol 25 MG tablet Commonly known as:  TENORMIN Take 25 mg by mouth 2 (two) times daily.   calcitonin (salmon) 200 UNIT/ACT nasal spray Commonly known as:  MIACALCIN/FORTICAL Place 1 spray into alternate nostrils daily.   CALCIUM 600/VITAMIN D 600-400 MG-UNIT Tabs Generic drug:  Calcium Carbonate-Vitamin D3 Take 1 tablet by mouth 2 (two) times daily with a meal.   donepezil 10 MG tablet Commonly known as:  ARICEPT Take one tablet at bedtime for memory   dorzolamide-timolol 22.3-6.8 MG/ML ophthalmic solution Commonly known as:  COSOPT Place 1 drop into both eyes 2 (two) times daily. 2% - 0.5%.   feeding supplement Liqd Take 90 mLs by mouth 2 (two) times daily between meals.   flurbiprofen 100 MG tablet Commonly known as:  ANSAID TAKE 1 TABLET BY MOUTH EVERY DAY   latanoprost 0.005 % ophthalmic solution Commonly known as:  XALATAN Place 1 drop into both eyes at bedtime.   LORazepam 1 MG tablet Commonly known as:  ATIVAN Take 0.25 mg by mouth as needed for sleep.   Magnesium 250 MG Tabs Take 1 tablet by mouth daily.   memantine 10 MG tablet Commonly known as:  NAMENDA One twice daily to help preserve memory   mirtazapine 15 MG tablet Commonly known as:  REMERON Take 15 mg by mouth at bedtime.   URELLE 81 MG Tabs tablet Take 1 tablet by mouth at bedtime as needed.   vitamin E 400 UNIT capsule Take 400 Units by mouth daily.       Review of Systems  Constitutional: Positive for activity change, appetite change and fatigue. Negative for diaphoresis, fever and unexpected weight change.  HENT: Positive for voice change (hoarse since September 2014). Negative for congestion, ear discharge, ear pain, hearing loss, postnasal drip, rhinorrhea, sore throat, tinnitus and trouble swallowing.   Eyes:  Negative.  Negative for pain, redness, itching and visual disturbance.  Respiratory: Negative.  Negative for cough, choking, shortness of breath and wheezing.   Cardiovascular: Negative.  Negative for chest pain, palpitations and leg swelling.  Gastrointestinal: Negative.  Negative for abdominal distention, abdominal pain, constipation, diarrhea and nausea.  Endocrine: Negative.  Negative for cold intolerance, heat intolerance, polydipsia, polyphagia and polyuria.  Genitourinary: Negative.  Negative for difficulty urinating, dysuria, flank pain, frequency, hematuria, pelvic pain, urgency and vaginal discharge.  Musculoskeletal: Negative.  Negative for arthralgias, back pain, gait problem, myalgias, neck pain and neck stiffness.  Skin: Positive for wound. Negative for color change, pallor and rash.  Allergic/Immunologic: Negative.   Neurological: Negative for dizziness, tremors, seizures, syncope, weakness, numbness and headaches.       Memory loss  Hematological: Negative for adenopathy. Does not bruise/bleed easily.       Thrombocythemia  Psychiatric/Behavioral: Negative.  Negative for agitation, behavioral problems, confusion, dysphoric mood, hallucinations, sleep disturbance and suicidal ideas. The patient is not nervous/anxious and is not hyperactive.  Immunization History  Administered Date(s) Administered  . Influenza Whole 08/09/2012, 07/10/2013  . Influenza-Unspecified 08/14/2014, 08/08/2015, 08/26/2016  . Pneumococcal Polysaccharide-23 11/09/2004  . Zoster 10/07/2006   Pertinent  Health Maintenance Due  Topic Date Due  . PNA vac Low Risk Adult (2 of 2 - PCV13) 11/09/2005  . INFLUENZA VACCINE  Completed  . DEXA SCAN  Completed   Fall Risk  02/27/2016 12/19/2015 08/22/2015 02/21/2015 09/07/2014  Falls in the past year? No No No No No   Functional Status Survey:    Vitals:   11/11/16 0954  BP: 110/60  Pulse: 74  Resp: 18  Temp: 99.5 F (37.5 C)  Weight: 94 lb (42.6 kg)    Height: 5\' 1"  (1.549 m)   Body mass index is 17.76 kg/m. Physical Exam  Constitutional: She is oriented to person, place, and time. She appears well-developed and well-nourished. No distress.  HENT:  Head: Normocephalic and atraumatic.  Right Ear: External ear normal.  Left Ear: External ear normal.  Nose: Nose normal.  Mouth/Throat: Oropharynx is clear and moist.  Eyes: Conjunctivae and EOM are normal. Pupils are equal, round, and reactive to light.  Corrective lenses  Neck: Neck supple. No JVD present. No tracheal deviation present. No thyromegaly present.  Cardiovascular: Normal rate and regular rhythm.  Exam reveals no gallop and no friction rub.   Murmur (2/6/LSB SEM) heard. Pulmonary/Chest: No respiratory distress. She has no wheezes. She has no rales.  Abdominal: She exhibits no distension and no mass. There is no tenderness.  Musculoskeletal: She exhibits no edema or tenderness.  Heberden's nodes. Bouchard's nodes. Contracture of the right hand.  Lymphadenopathy:    She has no cervical adenopathy.  Neurological: She is alert and oriented to person, place, and time. She has normal reflexes. No cranial nerve deficit. Coordination normal.  08/22/2015 MMSE 28/30. Failed clock drawing. 09/17/16 MMSE 25/30. Failed clock drawing.  Skin: No rash noted. No erythema. No pallor.  Psychiatric: She has a normal mood and affect. Her behavior is normal. Judgment and thought content normal.    Labs reviewed:  Recent Labs  12/12/15 1732  01/08/16 0917  06/18/16 1013  09/21/16 1018 10/19/16 0815 11/03/16  NA 143  < > 140  < > 144  < > 147* 148* 144  K 3.6  < > 3.8  < > 4.1  < > 4.3 4.0 4.3  CL 108  --  107  --   --   --   --   --   --   CO2 23  < > 24  < > 27  --  27 26  --   GLUCOSE 158*  < > 105*  < > 87  --  90 138  --   BUN 26*  < > 21*  < > 26.2*  < > 28.5* 31.4* 30*  CREATININE 1.10*  < > 1.08*  < > 1.1  < > 1.3* 1.2* 1.1  CALCIUM 8.8*  < > 8.5*  < > 9.1  --  9.1 9.0  --    < > = values in this interval not displayed.  Recent Labs  06/18/16 1013 07/14/16 09/01/16 1145 09/21/16 1018 10/19/16 0815  AST 17 12*  --  17 16  ALT <9 5*  --  8 10  ALKPHOS 73 51  --  73 63  BILITOT 0.34  --   --  0.47 0.35  PROT 6.8  --  6.6 6.5 6.1*  ALBUMIN 3.5  --   --  3.4* 3.2*    Recent Labs  09/01/16 0902  09/21/16 1018  10/19/16 0815 10/20/16 11/03/16  WBC 5.6  < > 5.8  < > 5.3 5.5 4.6  NEUTROABS 3.7  --  3.7  --  3.4  --   --   HGB 9.0*  < > 8.6*  < > 9.1* 9.6* 9.4*  HCT 29.5*  < > 28.1*  < > 29.7* 30* 30*  MCV 93.9  --  95.6  --  97.4  --   --   PLT 253  < > 312  < > 326 379 297  < > = values in this interval not displayed. No results found for: TSH No results found for: HGBA1C Lab Results  Component Value Date   CHOL 174 07/14/2016   HDL 59 07/14/2016   LDLCALC 92 07/14/2016   TRIG 117 07/14/2016    Significant Diagnostic Results in last 30 days:  No results found.  Assessment/Plan Dementia 12/16/15 MMSE 24/30, 25/30, continue AL for care needs. Supervision for safety 02/20/16 MMSE 24/30. Failed clock drawing Continue Aricept, Namenda Repeat MMSE  Adjustment disorder with mixed anxiety and depressed mood Increased agitation and confusion, will increase Mirtazapine to 30mg . Observe.   Anemia Anemia, Hgb 9.1 10/19/16, 10/19/16 Darbepoetin alfa 140mcg inj  Essential thrombocythemia ([place]) Continue Agrylin 0.5mg  bid, last Plt 379 10/06/16  Hypertension Controlled, continue Atenolol 25mg  bid   Adult failure to thrive SNF for care needs.   Constipation MiraLax daily     Family/ staff Communication: SNF  Labs/tests ordered: MMSE

## 2016-11-11 NOTE — Assessment & Plan Note (Signed)
Controlled, continue Atenolol 25mg bid.  

## 2016-11-11 NOTE — Assessment & Plan Note (Signed)
SNF for care needs.    

## 2016-11-11 NOTE — Assessment & Plan Note (Signed)
MiraLax daily.  

## 2016-11-11 NOTE — Assessment & Plan Note (Signed)
Continue Agrylin 0.5mg  bid, last Plt 379 10/06/16

## 2016-11-11 NOTE — Assessment & Plan Note (Signed)
Anemia, Hgb 9.1 10/19/16, 10/19/16 Darbepoetin alfa 173mcg inj

## 2016-11-11 NOTE — Assessment & Plan Note (Signed)
12/16/15 MMSE 24/30, 25/30, continue AL for care needs. Supervision for safety 02/20/16 MMSE 24/30. Failed clock drawing Continue Aricept, Namenda Repeat MMSE

## 2016-11-12 ENCOUNTER — Observation Stay (HOSPITAL_COMMUNITY): Payer: Medicare Other

## 2016-11-12 ENCOUNTER — Observation Stay (HOSPITAL_BASED_OUTPATIENT_CLINIC_OR_DEPARTMENT_OTHER): Payer: Medicare Other

## 2016-11-12 ENCOUNTER — Inpatient Hospital Stay (HOSPITAL_COMMUNITY)
Admission: EM | Admit: 2016-11-12 | Discharge: 2016-11-17 | DRG: 193 | Disposition: A | Discharge status: Skilled Nursing Facility | Payer: Medicare Other | Attending: Internal Medicine | Admitting: Internal Medicine

## 2016-11-12 ENCOUNTER — Encounter (HOSPITAL_COMMUNITY): Payer: Self-pay | Admitting: Emergency Medicine

## 2016-11-12 DIAGNOSIS — N179 Acute kidney failure, unspecified: Secondary | ICD-10-CM

## 2016-11-12 DIAGNOSIS — R55 Syncope and collapse: Secondary | ICD-10-CM

## 2016-11-12 DIAGNOSIS — M899 Disorder of bone, unspecified: Secondary | ICD-10-CM | POA: Diagnosis present

## 2016-11-12 DIAGNOSIS — N171 Acute kidney failure with acute cortical necrosis: Secondary | ICD-10-CM | POA: Diagnosis present

## 2016-11-12 DIAGNOSIS — R109 Unspecified abdominal pain: Secondary | ICD-10-CM | POA: Diagnosis present

## 2016-11-12 DIAGNOSIS — M5440 Lumbago with sciatica, unspecified side: Secondary | ICD-10-CM | POA: Diagnosis present

## 2016-11-12 DIAGNOSIS — I130 Hypertensive heart and chronic kidney disease with heart failure and stage 1 through stage 4 chronic kidney disease, or unspecified chronic kidney disease: Secondary | ICD-10-CM | POA: Diagnosis present

## 2016-11-12 DIAGNOSIS — D473 Essential (hemorrhagic) thrombocythemia: Secondary | ICD-10-CM | POA: Diagnosis present

## 2016-11-12 DIAGNOSIS — I5030 Unspecified diastolic (congestive) heart failure: Secondary | ICD-10-CM | POA: Diagnosis present

## 2016-11-12 DIAGNOSIS — E860 Dehydration: Secondary | ICD-10-CM | POA: Diagnosis not present

## 2016-11-12 DIAGNOSIS — Z8249 Family history of ischemic heart disease and other diseases of the circulatory system: Secondary | ICD-10-CM

## 2016-11-12 DIAGNOSIS — I472 Ventricular tachycardia: Secondary | ICD-10-CM | POA: Diagnosis not present

## 2016-11-12 DIAGNOSIS — N183 Chronic kidney disease, stage 3 unspecified: Secondary | ICD-10-CM

## 2016-11-12 DIAGNOSIS — R627 Adult failure to thrive: Secondary | ICD-10-CM | POA: Diagnosis present

## 2016-11-12 DIAGNOSIS — S01412A Laceration without foreign body of left cheek and temporomandibular area, initial encounter: Secondary | ICD-10-CM | POA: Diagnosis present

## 2016-11-12 DIAGNOSIS — Z791 Long term (current) use of non-steroidal anti-inflammatories (NSAID): Secondary | ICD-10-CM

## 2016-11-12 DIAGNOSIS — J189 Pneumonia, unspecified organism: Secondary | ICD-10-CM

## 2016-11-12 DIAGNOSIS — R509 Fever, unspecified: Secondary | ICD-10-CM

## 2016-11-12 DIAGNOSIS — Z885 Allergy status to narcotic agent status: Secondary | ICD-10-CM

## 2016-11-12 DIAGNOSIS — H409 Unspecified glaucoma: Secondary | ICD-10-CM | POA: Diagnosis present

## 2016-11-12 DIAGNOSIS — R269 Unspecified abnormalities of gait and mobility: Secondary | ICD-10-CM | POA: Diagnosis present

## 2016-11-12 DIAGNOSIS — Z7901 Long term (current) use of anticoagulants: Secondary | ICD-10-CM

## 2016-11-12 DIAGNOSIS — Z79899 Other long term (current) drug therapy: Secondary | ICD-10-CM

## 2016-11-12 DIAGNOSIS — G9340 Encephalopathy, unspecified: Secondary | ICD-10-CM | POA: Diagnosis present

## 2016-11-12 DIAGNOSIS — Y92002 Bathroom of unspecified non-institutional (private) residence single-family (private) house as the place of occurrence of the external cause: Secondary | ICD-10-CM

## 2016-11-12 DIAGNOSIS — D638 Anemia in other chronic diseases classified elsewhere: Secondary | ICD-10-CM | POA: Diagnosis present

## 2016-11-12 DIAGNOSIS — S0181XA Laceration without foreign body of other part of head, initial encounter: Secondary | ICD-10-CM | POA: Insufficient documentation

## 2016-11-12 DIAGNOSIS — F0390 Unspecified dementia without behavioral disturbance: Secondary | ICD-10-CM | POA: Diagnosis present

## 2016-11-12 DIAGNOSIS — E43 Unspecified severe protein-calorie malnutrition: Secondary | ICD-10-CM | POA: Diagnosis present

## 2016-11-12 DIAGNOSIS — E785 Hyperlipidemia, unspecified: Secondary | ICD-10-CM | POA: Diagnosis present

## 2016-11-12 DIAGNOSIS — K5900 Constipation, unspecified: Secondary | ICD-10-CM | POA: Diagnosis present

## 2016-11-12 DIAGNOSIS — N3010 Interstitial cystitis (chronic) without hematuria: Secondary | ICD-10-CM | POA: Diagnosis present

## 2016-11-12 DIAGNOSIS — Z8679 Personal history of other diseases of the circulatory system: Secondary | ICD-10-CM

## 2016-11-12 DIAGNOSIS — W228XXA Striking against or struck by other objects, initial encounter: Secondary | ICD-10-CM | POA: Diagnosis present

## 2016-11-12 DIAGNOSIS — Z7982 Long term (current) use of aspirin: Secondary | ICD-10-CM

## 2016-11-12 DIAGNOSIS — Z66 Do not resuscitate: Secondary | ICD-10-CM | POA: Diagnosis present

## 2016-11-12 DIAGNOSIS — M1990 Unspecified osteoarthritis, unspecified site: Secondary | ICD-10-CM | POA: Diagnosis present

## 2016-11-12 DIAGNOSIS — Z9181 History of falling: Secondary | ICD-10-CM

## 2016-11-12 DIAGNOSIS — Z8619 Personal history of other infectious and parasitic diseases: Secondary | ICD-10-CM

## 2016-11-12 DIAGNOSIS — R413 Other amnesia: Secondary | ICD-10-CM | POA: Diagnosis present

## 2016-11-12 DIAGNOSIS — I447 Left bundle-branch block, unspecified: Secondary | ICD-10-CM | POA: Diagnosis present

## 2016-11-12 DIAGNOSIS — Z681 Body mass index (BMI) 19 or less, adult: Secondary | ICD-10-CM

## 2016-11-12 LAB — TSH: TSH: 0.85 u[IU]/mL (ref 0.350–4.500)

## 2016-11-12 LAB — CBC WITH DIFFERENTIAL/PLATELET
Basophils Absolute: 0 10*3/uL (ref 0.0–0.1)
Basophils Relative: 0 %
EOS ABS: 0 10*3/uL (ref 0.0–0.7)
Eosinophils Relative: 0 %
HCT: 32.7 % — ABNORMAL LOW (ref 36.0–46.0)
HEMOGLOBIN: 10.1 g/dL — AB (ref 12.0–15.0)
LYMPHS ABS: 0.8 10*3/uL (ref 0.7–4.0)
Lymphocytes Relative: 9 %
MCH: 28.9 pg (ref 26.0–34.0)
MCHC: 30.9 g/dL (ref 30.0–36.0)
MCV: 93.7 fL (ref 78.0–100.0)
Monocytes Absolute: 0.7 10*3/uL (ref 0.1–1.0)
Monocytes Relative: 9 %
NEUTROS PCT: 82 %
Neutro Abs: 6.7 10*3/uL (ref 1.7–7.7)
PLATELETS: 275 10*3/uL (ref 150–400)
RBC: 3.49 MIL/uL — AB (ref 3.87–5.11)
RDW: 18.2 % — ABNORMAL HIGH (ref 11.5–15.5)
WBC: 8.3 10*3/uL (ref 4.0–10.5)

## 2016-11-12 LAB — COMPREHENSIVE METABOLIC PANEL
ALK PHOS: 57 U/L (ref 38–126)
ALT: 10 U/L — ABNORMAL LOW (ref 14–54)
ANION GAP: 12 (ref 5–15)
AST: 19 U/L (ref 15–41)
Albumin: 3.8 g/dL (ref 3.5–5.0)
BUN: 55 mg/dL — ABNORMAL HIGH (ref 6–20)
CALCIUM: 7.8 mg/dL — AB (ref 8.9–10.3)
CHLORIDE: 107 mmol/L (ref 101–111)
CO2: 24 mmol/L (ref 22–32)
Creatinine, Ser: 1.45 mg/dL — ABNORMAL HIGH (ref 0.44–1.00)
GFR, EST AFRICAN AMERICAN: 35 mL/min — AB (ref >60)
GFR, EST NON AFRICAN AMERICAN: 30 mL/min — AB (ref >60)
Glucose, Bld: 112 mg/dL — ABNORMAL HIGH (ref 65–99)
Potassium: 3.5 mmol/L (ref 3.5–5.1)
SODIUM: 143 mmol/L (ref 135–145)
Total Bilirubin: 0.6 mg/dL (ref 0.3–1.2)
Total Protein: 6.9 g/dL (ref 6.5–8.1)

## 2016-11-12 LAB — ECHOCARDIOGRAM COMPLETE
HEIGHTINCHES: 61 in
WEIGHTICAEL: 1504 [oz_av]

## 2016-11-12 LAB — OCCULT BLOOD, POC DEVICE: Fecal Occult Bld: NEGATIVE

## 2016-11-12 LAB — PROTIME-INR
INR: 1.14
PROTHROMBIN TIME: 14.6 s (ref 11.4–15.2)

## 2016-11-12 LAB — LIPASE, BLOOD: LIPASE: 16 U/L (ref 11–51)

## 2016-11-12 LAB — MRSA PCR SCREENING: MRSA BY PCR: NEGATIVE

## 2016-11-12 MED ORDER — LIDOCAINE-EPINEPHRINE-TETRACAINE (LET) SOLUTION
3.0000 mL | Freq: Once | NASAL | Status: AC
  Administered 2016-11-12: 11:00:00 3 mL via TOPICAL
  Filled 2016-11-12: qty 3

## 2016-11-12 MED ORDER — BACITRACIN ZINC 500 UNIT/GM EX OINT
1.0000 | TOPICAL_OINTMENT | Freq: Once | CUTANEOUS | Status: AC
Start: 2016-11-12 — End: 2016-11-12
  Administered 2016-11-12: 1 via TOPICAL
  Filled 2016-11-12: qty 0.9

## 2016-11-12 MED ORDER — CALCITONIN (SALMON) 200 UNIT/ACT NA SOLN
1.0000 | Freq: Every day | NASAL | Status: DC
  Administered 2016-11-12 – 2016-11-17 (×6): 1 via NASAL
  Filled 2016-11-12 (×2): qty 3.7

## 2016-11-12 MED ORDER — MAGNESIUM 200 MG PO TABS
1.0000 | ORAL_TABLET | Freq: Every day | ORAL | Status: DC
  Administered 2016-11-12 – 2016-11-17 (×6): 200 mg via ORAL
  Filled 2016-11-12 (×11): qty 1

## 2016-11-12 MED ORDER — SODIUM CHLORIDE 0.9 % IV SOLN: INTRAVENOUS | Status: DC

## 2016-11-12 MED ORDER — ONDANSETRON HCL 4 MG/2ML IJ SOLN
4.0000 mg | Freq: Four times a day (QID) | INTRAMUSCULAR | Status: DC | PRN
  Administered 2016-11-12 – 2016-11-16 (×2): 4 mg via INTRAVENOUS
  Filled 2016-11-12 (×2): qty 2

## 2016-11-12 MED ORDER — ACETAMINOPHEN 650 MG RE SUPP
650.0000 mg | Freq: Four times a day (QID) | RECTAL | Status: DC | PRN
Start: 2016-11-12 — End: 2016-11-14

## 2016-11-12 MED ORDER — ATENOLOL 25 MG PO TABS
25.0000 mg | ORAL_TABLET | Freq: Two times a day (BID) | ORAL | Status: DC
  Administered 2016-11-12 – 2016-11-13 (×3): 25 mg via ORAL
  Filled 2016-11-12 (×3): qty 1

## 2016-11-12 MED ORDER — ENOXAPARIN SODIUM 30 MG/0.3ML ~~LOC~~ SOLN
20.0000 mg | SUBCUTANEOUS | Status: DC
  Administered 2016-11-12 – 2016-11-16 (×5): 20 mg via SUBCUTANEOUS
  Filled 2016-11-12 (×5): qty 0.3

## 2016-11-12 MED ORDER — DONEPEZIL HCL 5 MG PO TABS
10.0000 mg | ORAL_TABLET | Freq: Every day | ORAL | Status: DC
  Administered 2016-11-12 – 2016-11-16 (×5): 10 mg via ORAL
  Filled 2016-11-12 (×5): qty 2

## 2016-11-12 MED ORDER — LIDOCAINE HCL (PF) 1 % IJ SOLN
5.0000 mL | Freq: Once | INTRAMUSCULAR | Status: AC
  Administered 2016-11-12: 5 mL via INTRADERMAL
  Filled 2016-11-12: qty 30

## 2016-11-12 MED ORDER — VITAMIN E 180 MG (400 UNIT) PO CAPS
400.0000 [IU] | ORAL_CAPSULE | Freq: Every day | ORAL | Status: DC
  Administered 2016-11-12 – 2016-11-17 (×6): 400 [IU] via ORAL
  Filled 2016-11-12 (×6): qty 1

## 2016-11-12 MED ORDER — POTASSIUM CHLORIDE IN NACL 20-0.9 MEQ/L-% IV SOLN
INTRAVENOUS | Status: AC
  Administered 2016-11-12: 14:00:00 via INTRAVENOUS
  Filled 2016-11-12: qty 1000

## 2016-11-12 MED ORDER — ANAGRELIDE HCL 0.5 MG PO CAPS
3.0000 mg | ORAL_CAPSULE | Freq: Every day | ORAL | Status: DC
  Administered 2016-11-12 – 2016-11-17 (×6): 3 mg via ORAL
  Filled 2016-11-12 (×6): qty 6

## 2016-11-12 MED ORDER — MIRTAZAPINE 15 MG PO TABS: 30.0000 mg | ORAL_TABLET | Freq: Every day | ORAL | Status: DC

## 2016-11-12 MED ORDER — ONDANSETRON HCL 4 MG PO TABS: 4.0000 mg | ORAL_TABLET | Freq: Four times a day (QID) | ORAL | Status: DC | PRN

## 2016-11-12 MED ORDER — MIRTAZAPINE 15 MG PO TABS
15.0000 mg | ORAL_TABLET | Freq: Every day | ORAL | Status: DC
  Administered 2016-11-12 – 2016-11-16 (×5): 15 mg via ORAL
  Filled 2016-11-12 (×5): qty 1

## 2016-11-12 MED ORDER — POLYETHYLENE GLYCOL 3350 17 G PO PACK
17.0000 g | PACK | Freq: Every day | ORAL | Status: DC
  Administered 2016-11-12 – 2016-11-16 (×4): 17 g via ORAL
  Filled 2016-11-12 (×6): qty 1

## 2016-11-12 MED ORDER — DOCUSATE SODIUM 100 MG PO CAPS: 100.0000 mg | ORAL_CAPSULE | Freq: Once | ORAL | Status: DC

## 2016-11-12 MED ORDER — MEMANTINE HCL 10 MG PO TABS
10.0000 mg | ORAL_TABLET | Freq: Two times a day (BID) | ORAL | Status: DC
  Filled 2016-11-12: qty 1

## 2016-11-12 MED ORDER — SODIUM CHLORIDE 0.9 % IV SOLN
Freq: Once | INTRAVENOUS | Status: AC
  Administered 2016-11-12: 08:00:00 via INTRAVENOUS

## 2016-11-12 MED ORDER — DORZOLAMIDE HCL-TIMOLOL MAL 2-0.5 % OP SOLN
1.0000 [drp] | Freq: Two times a day (BID) | OPHTHALMIC | Status: DC
  Administered 2016-11-12 – 2016-11-17 (×10): 1 [drp] via OPHTHALMIC
  Filled 2016-11-12: qty 10

## 2016-11-12 MED ORDER — ACETAMINOPHEN 325 MG PO TABS: 650.0000 mg | ORAL_TABLET | ORAL | Status: DC | PRN

## 2016-11-12 MED ORDER — ASPIRIN 81 MG PO TABS: 81.0000 mg | ORAL_TABLET | Freq: Every day | ORAL | Status: DC

## 2016-11-12 MED ORDER — ACETAMINOPHEN 325 MG PO TABS: 650.0000 mg | ORAL_TABLET | Freq: Four times a day (QID) | ORAL | Status: DC | PRN

## 2016-11-12 MED ORDER — BOOST / RESOURCE BREEZE PO LIQD
240.0000 mL | Freq: Two times a day (BID) | ORAL | Status: DC
  Administered 2016-11-12 – 2016-11-17 (×6): 1 via ORAL

## 2016-11-12 MED ORDER — SODIUM CHLORIDE 0.9% FLUSH
3.0000 mL | Freq: Two times a day (BID) | INTRAVENOUS | Status: DC
  Administered 2016-11-12 – 2016-11-16 (×6): 3 mL via INTRAVENOUS

## 2016-11-12 MED ORDER — LATANOPROST 0.005 % OP SOLN
1.0000 [drp] | Freq: Every day | OPHTHALMIC | Status: DC
  Administered 2016-11-12 – 2016-11-16 (×5): 1 [drp] via OPHTHALMIC
  Filled 2016-11-12: qty 2.5

## 2016-11-12 MED ORDER — CALCIUM CITRATE-VITAMIN D 500-400 MG-UNIT PO CHEW
1.0000 | CHEWABLE_TABLET | Freq: Two times a day (BID) | ORAL | Status: DC
  Administered 2016-11-12 – 2016-11-17 (×7): 1 via ORAL
  Filled 2016-11-12 (×11): qty 1

## 2016-11-12 NOTE — ED Triage Notes (Signed)
Pt brought via EMS from [facility] at [facility] for unwitnessed fall while sitting on toilet.  Pt denies any memory of event.  2in lac on left cheek.  No other injuries.  Denies any pain.  Hx dementia, A&O to self and location only but staff says this is baseline neuro status.  Staff reprots inc weakness over last week.  Hx htn.  DNR with patient.  Neg stroke screen.  Not on blood thinners. VS: 157/68, NSR, 66 bpm, 95% RA, 130 CBG.

## 2016-11-12 NOTE — ED Notes (Signed)
Bed: [id number] Expected date:  Expected time:  Means of arrival:  Comments: 83 yr old unwitnessed fall

## 2016-11-12 NOTE — ED Provider Notes (Signed)
[facility] DEPT Provider Note   CSN: [id number] Arrival date & time: 11/12/16  0550     History   Chief Complaint Chief Complaint  Patient presents with  . Fall    HPI [name] [name] is a 83 y.o. female.  HPI Patient had unwitnessed syncope\near syncope on the commode this morning. She is in assisted living. Caregivers checked in and she had slumped to the side but not fallen off of the commode. She has a laceration to her left cheek. She reportedly hit something adjacent to the toilet and cut her cheek. Yesterday, on the commode she had a full syncope and fell. No injury was identified. The patient was moved to a higher level of care or observation. She has been having constipation but otherwise denies vomiting, pain or fever. Past Medical History:  Diagnosis Date  . Abdominal pain, unspecified site 08/11/2012  . Anemia, unspecified 06/18/2011  . Arthritis   . BBB (bundle branch block) 03/03/2006  . Cardiac dysrhythmia, unspecified 03/03/2006  . Chronic interstitial cystitis 03/03/2006  . 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 Disorder of bone and cartilage, unspecified 04/20/2007  . Diverticulosis of colon (without mention of hemorrhage) 04/20/2007  . Essential thrombocythemia ([place]) 06/18/2011  . Glaucoma   . Hematuria, unspecified 04/22/2004  . Hemorrhage of gastrointestinal tract, unspecified 03/09/2007  . Herpes zoster without mention of complication [id number]  . Hypertension   . 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 Loss of weight 06/18/2011  . Lumbago 12/18/2010  . Memory loss 12/19/2009  . Neck fracture ([place])   . Osteoarthrosis, unspecified whether generalized or localized, unspecified site 03/03/2006  . Other and unspecified hyperlipidemia 09/01/2006  . Sciatica 06/19/2010    Patient Active Problem List   Diagnosis Date Noted  . Adult failure to thrive 11/11/2016  . Constipation 11/11/2016  . Insomnia 09/17/2016  . Dementia 01/20/2016  . Laceration of head 01/13/2016    . Adjustment disorder with mixed anxiety and depressed mood 12/26/2015  . UTI (urinary tract infection) 12/19/2015  . Osteoporosis 12/19/2015  . Essential thrombocythemia ([place]) 09/28/2014  . Hoarse 05/07/2013  . Hypertension   . Glaucoma 01/06/2012  . Anemia 06/18/2011  . Loss of weight 06/18/2011  . Memory loss 12/19/2009  . Hyperlipidemia 09/01/2006    Past Surgical History:  Procedure Laterality Date  . ABDOMINAL HYSTERECTOMY    . APPENDECTOMY    . BREAST CYST EXCISION Bilateral    benign  . CATARACT EXTRACTION W/ INTRAOCULAR LENS  IMPLANT, BILATERAL    . TONSILLECTOMY      OB History    No data available       Home Medications    Prior to Admission medications   Medication Sig Start Date End Date Taking? Authorizing Provider  acetaminophen (TYLENOL) 325 MG tablet Take 650 mg by mouth every 4 (four) hours as needed for mild pain, fever or headache.   Yes Historical Provider, MD  anagrelide (AGRYLIN) 1 MG capsule Take 3 mg by mouth daily.    Yes Historical Provider, MD  aspirin 81 MG tablet Take 81 mg by mouth daily.   Yes Historical Provider, MD  atenolol (TENORMIN) 25 MG tablet Take 25 mg by mouth 2 (two) times daily.   Yes Historical Provider, MD  calcitonin, salmon, (MIACALCIN/FORTICAL) 200 UNIT/ACT nasal spray Place 1 spray into alternate nostrils daily.   Yes Historical Provider, MD  Calcium Carbonate-Vitamin D3 (CALCIUM 600/VITAMIN D) 600-400 MG-UNIT TABS Take 1 tablet by mouth 2 (two) times daily with a meal.  Yes Historical Provider, MD  donepezil (ARICEPT) 10 MG tablet Take one tablet at bedtime for memory 04/08/16  Yes Historical Provider, MD  dorzolamide-timolol (COSOPT) 22.3-6.8 MG/ML ophthalmic solution Place 1 drop into both eyes 2 (two) times daily. 2% - 0.5%.   Yes Historical Provider, MD  feeding supplement (BOOST / RESOURCE BREEZE) LIQD Take 90 mLs by mouth 2 (two) times daily between meals.    Yes Historical Provider, MD  flurbiprofen (ANSAID) 100 MG  tablet TAKE 1 TABLET BY MOUTH EVERY DAY 09/13/15  Yes Estill Dooms, MD  latanoprost (XALATAN) 0.005 % ophthalmic solution Place 1 drop into both eyes at bedtime.   Yes Historical Provider, MD  LORazepam (ATIVAN) 1 MG tablet Take 0.25 mg by mouth as needed for sleep.   Yes Historical Provider, MD  Magnesium 250 MG TABS Take 1 tablet by mouth daily.    Yes Historical Provider, MD  memantine (NAMENDA) 10 MG tablet One twice daily to help preserve memory 05/14/16  Yes Estill Dooms, MD  mirtazapine (REMERON) 15 MG tablet Take 30 mg by mouth at bedtime.    Yes Historical Provider, MD  polyethylene glycol (MIRALAX / GLYCOLAX) packet Take 17 g by mouth daily.   Yes Historical Provider, MD  URELLE (URELLE/URISED) 81 MG TABS Take 1 tablet by mouth at bedtime as needed.  10/03/11  Yes Historical Provider, MD  vitamin E 400 UNIT capsule Take 400 Units by mouth daily.   Yes Historical Provider, MD    Family History Family History  Problem Relation Age of Onset  . Heart disease Father     MI    Social History Social History  Substance Use Topics  . Smoking status: Never Smoker  . Smokeless tobacco: Never Used  . Alcohol use No     Allergies   Darvocet [propoxyphene n-acetaminophen]; Darvon; Meperidine and related; Vicodin [hydrocodone-acetaminophen]; and Demerol [meperidine]   Review of Systems Review of Systems 10 Systems reviewed and are negative for acute change except as noted in the HPI.  Physical Exam Updated Vital Signs BP 143/58   Pulse 66   Temp 98.7 F (37.1 C) (Oral)   Resp 15   Ht 5\' 1"  (1.549 m)   Wt 94 lb (42.6 kg)   SpO2 98%   BMI 17.76 kg/m   Physical Exam  Constitutional: She is oriented to person, place, and time. She appears well-developed and well-nourished. No distress.  HENT:  Right Ear: External ear normal.  Left Ear: External ear normal.  Mouth/Throat: Oropharynx is clear and moist.  Patient has facial laceration to the left cheek,  approximate 2 cm,  this creates a flap that pulls back approximately 1.5 cm. It oriented obliquely towards the corner of the eye but does not interrupt the canthus.  Eyes: Conjunctivae and EOM are normal. Pupils are equal, round, and reactive to light.  Neck: Neck supple.  No C-spine tenderness.  Cardiovascular: Normal rate and regular rhythm.   No murmur heard. Pulmonary/Chest: Effort normal and breath sounds normal. No respiratory distress. She exhibits no tenderness.  Abdominal: Soft. There is tenderness.  Mild to moderate diffuse lower abdominal discomfort of palpation. No guarding.  Genitourinary:  Genitourinary Comments: Soft yellow-brown stool in the vault. No impaction.  Musculoskeletal: Normal range of motion. She exhibits no edema, tenderness or deformity.  Neurological: She is alert and oriented to person, place, and time. No cranial nerve deficit. She exhibits normal muscle tone. Coordination normal.  Skin: Skin is warm and dry. There  is pallor.  Psychiatric: She has a normal mood and affect.  Nursing note and vitals reviewed.    ED Treatments / Results  Labs (all labs ordered are listed, but only abnormal results are displayed) Labs Reviewed  COMPREHENSIVE METABOLIC PANEL - Abnormal; Notable for the following:       Result Value   Glucose, Bld 112 (*)    BUN 55 (*)    Creatinine, Ser 1.45 (*)    Calcium 7.8 (*)    ALT 10 (*)    GFR calc non Af Amer 30 (*)    GFR calc Af Amer 35 (*)    All other components within normal limits  CBC WITH DIFFERENTIAL/PLATELET - Abnormal; Notable for the following:    RBC 3.49 (*)    Hemoglobin 10.1 (*)    HCT 32.7 (*)    RDW 18.2 (*)    All other components within normal limits  LIPASE, BLOOD  PROTIME-INR  POC OCCULT BLOOD, ED    EKG  EKG Interpretation None       Radiology No results found.  Procedures Procedures (including critical care time)  Medications Ordered in ED Medications  0.9 %  sodium chloride infusion ( Intravenous New  Bag/Given 11/12/16 0814)  lidocaine-EPINEPHrine-tetracaine (LET) solution (3 mLs Topical Given 11/12/16 1033)  lidocaine (PF) (XYLOCAINE) 1 % injection 5 mL (5 mLs Intradermal Given 11/12/16 1034)  bacitracin ointment 1 application (1 application Topical Given 11/12/16 1033)     Initial Impression / Assessment and Plan / ED Course  I have reviewed the triage vital signs and the nursing notes.  Pertinent labs & imaging results that were available during my care of the patient were reviewed by me and considered in my medical decision making (see chart for details).  Clinical Course     Final Clinical Impressions(s) / ED Diagnoses   Final diagnoses:  Syncope and collapse  Dehydration  Facial laceration, initial encounter  Patient had a syncopal episode yesterday. She had an episode again today where she nearly fell but did not collapse to the floor. She hit her face on an adjacent vanity are wall with a laceration. No other head injury. Patient however does have dehydration by lab work. At this time plan will be to admit for rehydration with syncope and collapse.  New Prescriptions New Prescriptions   No medications on file     Charlesetta Shanks, MD 11/12/16 1049

## 2016-11-12 NOTE — ED Provider Notes (Signed)
  Physical Exam  BP 143/58   Pulse 66   Temp 98.7 F (37.1 C) (Oral)   Resp 15   Ht 5\' 1"  (1.549 m)   Wt 42.6 kg   SpO2 98%   BMI 17.76 kg/m   Physical Exam  ED Course  .Marland KitchenLaceration Repair Date/Time: 11/12/2016 10:17 AM Performed by: Monico Blitz Authorized by: Monico Blitz   Consent:    Consent obtained:  Verbal   Consent given by:  Patient Anesthesia (see MAR for exact dosages):    Anesthesia method:  Topical application and local infiltration   Topical anesthetic:  LET   Local anesthetic:  Lidocaine 1% w/o epi Laceration details:    Location:  Face   Face location:  L cheek   Length (cm):  4 Repair type:    Repair type:  Complex Pre-procedure details:    Preparation:  Patient was prepped and draped in usual sterile fashion Exploration:    Limited defect created (wound extended): no     Wound exploration: entire depth of wound probed and visualized     Contaminated: no   Treatment:    Area cleansed with:  Saline   Amount of cleaning:  Standard   Irrigation solution:  Sterile saline   Irrigation volume:  500cc   Irrigation method:  Syringe   Visualized foreign bodies/material removed: no     Debridement:  Moderate   Undermining:  Minimal   Scar revision: no   Skin repair:    Repair method:  Sutures   Suture size:  6-0   Wound skin closure material used: Ethytlon.   Suture technique:  Simple interrupted (1x corner & simple interupted)   Number of sutures: 10 interrupted and one corner suture. Approximation:    Approximation:  Close   Vermilion border: well-aligned   Post-procedure details:    Dressing:  Antibiotic ointment   Patient tolerance of procedure:  Tolerated well, no immediate complications           Monico Blitz, PA-C 11/12/16 1027    Charlesetta Shanks, MD 11/12/16 1109

## 2016-11-12 NOTE — H&P (Addendum)
History and Physical  [name] [name] [id number] DOB: 06-15-1923 DOA: 11/12/2016   PCP: [name], MD    Patient coming from: Home  Chief Complaint: syncope  HPI:  [name] [name] is a 83 y.o. female with medical history of dementia, essential thrombocythemia, hypertension, interstitial cystitis, and LBBB presented with a syncopal episode earlier on the day of admission. On 11/11/2016, the patient had an unwitnessed syncopal episode. She was found slumped over her commode. The patient was moved from assisted living to a skilled nursing facility level of care at [facility]. Unfortunately, around 4:30 on the morning of admission, the patient once again was found on the commode slumped over. Apparently, the patient was conscious at the time but was found to have a laceration on the left side of her face. Due to the patient's history of cognitive impairment, she is unable to provide any significant history. The patient cannot recall any of her recent events. However she denies headache, visual disturbance, chest discomfort, short of breath, vomiting, diarrhea, abdominal pain, dysuria. The patient's grandson at the bedside supplements the history. He states that the only recent medication change was an increase in her Remeron dose over the past month. He also states that the patient struggles with constipation and has had poor oral intake. He states that she has had a history of falls, but has not fallen in over one year. She normally ambulatory with a walker. The patient's grandson states that the patient's mental status is at baseline.  In the emergency department, the patient was afebrile and hemodynamically stable saturating well on room air. CBC was unremarkable. Hepatic enzymes were negative. Metabolic panel showed serum creatinine 1.45 with unremarkable electrolytes. INR was 1.14. Admission was requested for evaluation of syncope.  Assessment/Plan: Syncope -Clearly, the patient hit the  side of her face resulting in laceration, but does not recall the events -Suspect volume depletion versus vasovagal -Check orthostatic vital signs--not performed in the emergency department -Echocardiogram -IV fluids 12 hours -Placed on telemetry -Order EKG  Facial laceration -Sutured in the emergency department -CT face and CT brain--clearly, the patient had some type of trauma hitting her face and possibly her head  Acute on chronic renal failure--CKD 2-3 -Baseline creatinine 0.9-1.1 -Presenting creatinine 1.45 -IV fluids 12 hours -Likely due to volume depletion in the setting of flurbiprofen use  Abdominal pain -2 view abd xray  Gait instability -Urinalysis and urine culture -PT evaluation -TSH  Dementia -Continue Aricept and Namenda  Essential thrombocythemia -Continue anagrelide -holding ASA presently per PharmD recommendation  Hypertension -Continue atenolol  Active Problems:   Syncope       Past Medical History:  Diagnosis Date  . Abdominal pain, unspecified site 08/11/2012  . Anemia, unspecified 06/18/2011  . Arthritis   . BBB (bundle branch block) 03/03/2006  . Cardiac dysrhythmia, unspecified 03/03/2006  . Chronic interstitial cystitis 03/03/2006  . 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 Disorder of bone and cartilage, unspecified 04/20/2007  . Diverticulosis of colon (without mention of hemorrhage) 04/20/2007  . Essential thrombocythemia ([place]) 06/18/2011  . Glaucoma   . Hematuria, unspecified 04/22/2004  . Hemorrhage of gastrointestinal tract, unspecified 03/09/2007  . Herpes zoster without mention of complication [id number]  . Hypertension   . 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 Loss of weight 06/18/2011  . Lumbago 12/18/2010  . Memory loss 12/19/2009  . Neck fracture ([place])   . Osteoarthrosis, unspecified whether generalized or localized, unspecified site 03/03/2006  . Other and unspecified hyperlipidemia  09/01/2006  . Sciatica 06/19/2010   Past  Surgical History:  Procedure Laterality Date  . ABDOMINAL HYSTERECTOMY    . APPENDECTOMY    . BREAST CYST EXCISION Bilateral    benign  . CATARACT EXTRACTION W/ INTRAOCULAR LENS  IMPLANT, BILATERAL    . TONSILLECTOMY     Social History:  reports that she has never smoked. She has never used smokeless tobacco. She reports that she does not drink alcohol or use drugs.   Family History  Problem Relation Age of Onset  . Heart disease Father     MI     Allergies  Allergen Reactions  . Darvocet [Propoxyphene N-Acetaminophen] Nausea And Vomiting  . Darvon Nausea And Vomiting  . Meperidine And Related Nausea And Vomiting  . Vicodin [Hydrocodone-Acetaminophen] Nausea And Vomiting  . Demerol [Meperidine]      Prior to Admission medications   Medication Sig Start Date End Date Taking? Authorizing Provider  acetaminophen (TYLENOL) 325 MG tablet Take 650 mg by mouth every 4 (four) hours as needed for mild pain, fever or headache.   Yes Historical Provider, MD  anagrelide (AGRYLIN) 1 MG capsule Take 3 mg by mouth daily.    Yes Historical Provider, MD  aspirin 81 MG tablet Take 81 mg by mouth daily.   Yes Historical Provider, MD  atenolol (TENORMIN) 25 MG tablet Take 25 mg by mouth 2 (two) times daily.   Yes Historical Provider, MD  calcitonin, salmon, (MIACALCIN/FORTICAL) 200 UNIT/ACT nasal spray Place 1 spray into alternate nostrils daily.   Yes Historical Provider, MD  Calcium Carbonate-Vitamin D3 (CALCIUM 600/VITAMIN D) 600-400 MG-UNIT TABS Take 1 tablet by mouth 2 (two) times daily with a meal.   Yes Historical Provider, MD  donepezil (ARICEPT) 10 MG tablet Take one tablet at bedtime for memory 04/08/16  Yes Historical Provider, MD  dorzolamide-timolol (COSOPT) 22.3-6.8 MG/ML ophthalmic solution Place 1 drop into both eyes 2 (two) times daily. 2% - 0.5%.   Yes Historical Provider, MD  feeding supplement (BOOST / RESOURCE BREEZE) LIQD Take 90 mLs by mouth 2 (two) times daily between meals.     Yes Historical Provider, MD  flurbiprofen (ANSAID) 100 MG tablet TAKE 1 TABLET BY MOUTH EVERY DAY 09/13/15  Yes Estill Dooms, MD  latanoprost (XALATAN) 0.005 % ophthalmic solution Place 1 drop into both eyes at bedtime.   Yes Historical Provider, MD  LORazepam (ATIVAN) 1 MG tablet Take 0.25 mg by mouth as needed for sleep.   Yes Historical Provider, MD  Magnesium 250 MG TABS Take 1 tablet by mouth daily.    Yes Historical Provider, MD  memantine (NAMENDA) 10 MG tablet One twice daily to help preserve memory 05/14/16  Yes Estill Dooms, MD  mirtazapine (REMERON) 15 MG tablet Take 30 mg by mouth at bedtime.    Yes Historical Provider, MD  polyethylene glycol (MIRALAX / GLYCOLAX) packet Take 17 g by mouth daily.   Yes Historical Provider, MD  URELLE (URELLE/URISED) 81 MG TABS Take 1 tablet by mouth at bedtime as needed.  10/03/11  Yes Historical Provider, MD  vitamin E 400 UNIT capsule Take 400 Units by mouth daily.   Yes Historical Provider, MD    Review of Systems:  Constitutional:  No weight loss, night sweats, Fevers, chills, fatigue.  Head&Eyes: No headache.  No vision loss.  No eye pain or scotoma ENT:  No Difficulty swallowing,Tooth/dental problems,Sore throat,  No ear ache, post nasal drip,  Cardio-vascular:  No chest pain, Orthopnea,  PND, swelling in lower extremities,  dizziness, palpitations  GI:  No  abdominal pain, nausea, vomiting, diarrhea, loss of appetite, hematochezia, melena, heartburn, indigestion, Resp:  No shortness of breath with exertion or at rest. No cough. No coughing up of blood .No wheezing.No chest wall deformity  Skin:  no rash or lesions.  GU:  no dysuria, change in color of urine, no urgency or frequency. No flank pain.  Musculoskeletal:  No joint pain or swelling. No decreased range of motion. No back pain.  Psych:  No change in mood or affect. No depression or anxiety. Neurologic: No headache, no dysesthesia, no focal weakness, no vision loss. No  syncope  Physical Exam: Vitals:   11/12/16 0603 11/12/16 0908  BP: 170/60 143/58  Pulse: 63 66  Resp: 18 15  Temp: 98.7 F (37.1 C)   TempSrc: Oral   SpO2: 95% 98%  Weight: 42.6 kg (94 lb)   Height: 5\' 1"  (1.549 m)    General:  A&O x 2, NAD, nontoxic, pleasant/cooperative Head/Eye: No conjunctival hemorrhage, no icterus, [place]/AT, No nystagmus ENT:  No icterus,  No thrush, good dentition, no pharyngeal exudate Neck:  No masses, no lymphadenpathy, no bruits CV:  RRR, no rub, no gallop, no S3 Lung:  CTAB, good air movement, no wheeze, no rhonchi Abdomen: soft/NT, +BS, nondistended, no peritoneal signs Ext: No cyanosis, No rashes, No petechiae, No lymphangitis, No edema Neuro: CNII-XII intact, strength 4/5 in bilateral upper and lower extremities, no dysmetria  Labs on Admission:  Basic Metabolic Panel:  Recent Labs Lab 11/12/16 0813  NA 143  K 3.5  CL 107  CO2 24  GLUCOSE 112*  BUN 55*  CREATININE 1.45*  CALCIUM 7.8*   Liver Function Tests:  Recent Labs Lab 11/12/16 0813  AST 19  ALT 10*  ALKPHOS 57  BILITOT 0.6  PROT 6.9  ALBUMIN 3.8    Recent Labs Lab 11/12/16 0813  LIPASE 16   No results for input(s): AMMONIA in the last 168 hours. CBC:  Recent Labs Lab 11/12/16 0813  WBC 8.3  NEUTROABS 6.7  HGB 10.1*  HCT 32.7*  MCV 93.7  PLT 275   Coagulation Profile:  Recent Labs Lab 11/12/16 0813  INR 1.14   Cardiac Enzymes: No results for input(s): CKTOTAL, CKMB, CKMBINDEX, TROPONINI in the last 168 hours. BNP: Invalid input(s): POCBNP CBG: No results for input(s): GLUCAP in the last 168 hours. Urine analysis:    Component Value Date/Time   COLORURINE GREEN (A) 12/12/2015 1907   APPEARANCEUR CLOUDY (A) 12/12/2015 1907   LABSPEC 1.020 12/12/2015 1907   LABSPEC 1.010 06/26/2011 1500   PHURINE 5.5 12/12/2015 1907   GLUCOSEU NEGATIVE 12/12/2015 1907   HGBUR LARGE (A) 12/12/2015 1907   BILIRUBINUR NEGATIVE 12/12/2015 1907   BILIRUBINUR  Negative 06/26/2011 1500   KETONESUR NEGATIVE 12/12/2015 1907   PROTEINUR 100 (A) 12/12/2015 1907   NITRITE NEGATIVE 12/12/2015 1907   LEUKOCYTESUR MODERATE (A) 12/12/2015 1907   LEUKOCYTESUR Small 06/26/2011 1500   Sepsis Labs: @LABRCNTIP (procalcitonin:4,lacticidven:4) )No results found for this or any previous visit (from the past 240 hour(s)).   Radiological Exams on Admission: No results found.  EKG: Independently reviewed. pending    Time spent:60 minutes Code Status:   DNR Family Communication: Grandson updated at bedside Disposition Plan: expect 1-2 day hospitalization Consults called: none DVT Prophylaxis: [place] Lovenox  Oneka Parada, DO  [facility] Pager [phone]  If 7PM-7AM, please contact night-coverage [email or web address] Password [email or web address] 11/12/2016, 11:30 AM

## 2016-11-13 DIAGNOSIS — S0181XA Laceration without foreign body of other part of head, initial encounter: Secondary | ICD-10-CM | POA: Diagnosis not present

## 2016-11-13 DIAGNOSIS — R55 Syncope and collapse: Secondary | ICD-10-CM

## 2016-11-13 DIAGNOSIS — D473 Essential (hemorrhagic) thrombocythemia: Secondary | ICD-10-CM | POA: Diagnosis not present

## 2016-11-13 DIAGNOSIS — D638 Anemia in other chronic diseases classified elsewhere: Secondary | ICD-10-CM | POA: Diagnosis not present

## 2016-11-13 DIAGNOSIS — E43 Unspecified severe protein-calorie malnutrition: Secondary | ICD-10-CM | POA: Diagnosis not present

## 2016-11-13 DIAGNOSIS — N179 Acute kidney failure, unspecified: Secondary | ICD-10-CM | POA: Diagnosis not present

## 2016-11-13 DIAGNOSIS — F0390 Unspecified dementia without behavioral disturbance: Secondary | ICD-10-CM | POA: Diagnosis not present

## 2016-11-13 LAB — BASIC METABOLIC PANEL
Anion gap: 13 (ref 5–15)
BUN: 45 mg/dL — AB (ref 6–20)
CHLORIDE: 112 mmol/L — AB (ref 101–111)
CO2: 24 mmol/L (ref 22–32)
Calcium: 7.9 mg/dL — ABNORMAL LOW (ref 8.9–10.3)
Creatinine, Ser: 1.23 mg/dL — ABNORMAL HIGH (ref 0.44–1.00)
GFR calc Af Amer: 43 mL/min — ABNORMAL LOW (ref >60)
GFR calc non Af Amer: 37 mL/min — ABNORMAL LOW (ref >60)
GLUCOSE: 109 mg/dL — AB (ref 65–99)
POTASSIUM: 3.4 mmol/L — AB (ref 3.5–5.1)
Sodium: 149 mmol/L — ABNORMAL HIGH (ref 135–145)

## 2016-11-13 LAB — MAGNESIUM: MAGNESIUM: 2.3 mg/dL (ref 1.7–2.4)

## 2016-11-13 MED ORDER — ENSURE ENLIVE PO LIQD
237.0000 mL | Freq: Three times a day (TID) | ORAL | Status: DC
  Administered 2016-11-13 – 2016-11-17 (×7): 237 mL via ORAL

## 2016-11-13 MED ORDER — MEMANTINE HCL 10 MG PO TABS
5.0000 mg | ORAL_TABLET | Freq: Two times a day (BID) | ORAL | Status: DC
  Administered 2016-11-13 – 2016-11-17 (×9): 5 mg via ORAL
  Filled 2016-11-13 (×9): qty 1

## 2016-11-13 MED ORDER — DEXTROSE 5 % IV SOLN
INTRAVENOUS | Status: DC
  Administered 2016-11-13: 12:00:00 via INTRAVENOUS

## 2016-11-13 NOTE — Evaluation (Signed)
Physical Therapy Evaluation Patient Details Name: [name] [name] MRN: [id number] DOB: 01-06-1923 Today's Date: 11/13/2016   History of Present Illness  83 y.o. female past medical history of dementia, essential thrombocythemia, essential hypertension interstitial cystitis and a left bundle branch block and admitted with unwitnessed syncopal episode with facial laceration  Clinical Impression  Pt admitted with above diagnosis. Pt currently with functional limitations due to the deficits listed below (see PT Problem List).  Pt will benefit from skilled PT to increase their independence and safety with mobility to allow discharge to the venue listed below.  Pt lethargic today however answering questions appropriately.  Pt assisted to/from [facility] and then back to bed.  Pt reports mild dizziness throughout session.  Grandson present and reports facility currently transitioning pt belongings from ALF to SNF room.     Follow Up Recommendations SNF;Supervision/Assistance - 24 hour    Equipment Recommendations  None recommended by PT    Recommendations for Other Services       Precautions / Restrictions Precautions Precautions: Fall      Mobility  Bed Mobility Overal bed mobility: Needs Assistance Bed Mobility: Supine to Sit;Sit to Supine     Supine to sit: Mod assist Sit to supine: Max assist;+2 for physical assistance   General bed mobility comments: multimodal cues for technique, pt initiating mobility however requiring assist  Transfers Overall transfer level: Needs assistance Equipment used: 2 person hand held assist Transfers: Sit to/from Stand;Stand Pivot Transfers Sit to Stand: Min assist;+2 physical assistance;+2 safety/equipment Stand pivot transfers: Mod assist;+2 physical assistance;+2 safety/equipment       General transfer comment: Pt with loose BM on bed pads and agreeable to transfer to [facility], pt reports mild dizziness, HR high 70s and low 80s during transfer (NT into  room earlier to perform EKG due to tachy ?as pt was getting to EOB and assisted with transfers and hygiene then performed EKG)  MD into room once pt back in bed  Ambulation/Gait                Stairs            Wheelchair Mobility    Modified Rankin (Stroke Patients Only)       Balance Overall balance assessment: Needs assistance Sitting-balance support: Bilateral upper extremity supported;Feet supported Sitting balance-Leahy Scale: Poor Sitting balance - Comments: pt with left lateral lean sitting EOB requiring assist however once sitting on BSC able to self support trunk   Standing balance support: Bilateral upper extremity supported;During functional activity Standing balance-Leahy Scale: Zero                               Pertinent Vitals/Pain Pain Assessment: No/denies pain    Home Living Family/patient expects to be discharged to:: Skilled nursing facility               Home Equipment: Walker - 2 wheels Additional Comments: grandson reports they are currently transitioning from ALF to SNF while pt is in hospital    Prior Function Level of Independence: Independent with assistive device(s)         Comments: grandson reports pt is typically ambulatory with RW, has been declining over past year in regards to mobility and functional independence, 2 falls in the past year     Hand Dominance        Extremity/Trunk Assessment   Upper Extremity Assessment Upper Extremity Assessment: Generalized weakness  Lower Extremity Assessment Lower Extremity Assessment: Generalized weakness       Communication   Communication: HOH  Cognition Arousal/Alertness: Lethargic Behavior During Therapy: WFL for tasks assessed/performed Overall Cognitive Status: Impaired/Different from baseline                 General Comments: more lethargic then baseline, grandson reports changes in behavior due to dementia and MDs currently adjusting  her meds    General Comments      Exercises     Assessment/Plan    PT Assessment Patient needs continued PT services  PT Problem List Decreased strength;Decreased activity tolerance;Decreased balance;Decreased knowledge of use of DME;Decreased cognition;Decreased mobility;Decreased safety awareness          PT Treatment Interventions DME instruction;Gait training;Functional mobility training;Therapeutic exercise;Therapeutic activities;Patient/family education;Balance training    PT Goals (Current goals can be found in the Care Plan section)  Acute Rehab PT Goals PT Goal Formulation: With patient/family Time For Goal Achievement: 11/27/16 Potential to Achieve Goals: Good    Frequency Min 3X/week   Barriers to discharge        Co-evaluation               End of Session Equipment Utilized During Treatment: Gait belt;Oxygen Activity Tolerance: Patient limited by fatigue;Patient limited by lethargy Patient left: in bed;with call bell/phone within reach;with bed alarm set;with nursing/sitter in room Nurse Communication: Mobility status    Functional Assessment Tool Used: clinical judgement Functional Limitation: Mobility: Walking and moving around Mobility: Walking and Moving Around Current Status [id number]): At least 60 percent but less than 80 percent impaired, limited or restricted Mobility: Walking and Moving Around Goal Status [phone]): At least 20 percent but less than 40 percent impaired, limited or restricted    Time: 1015-1045 PT Time Calculation (min) (ACUTE ONLY): 30 min   Charges:   PT Evaluation $PT Eval Moderate Complexity: 1 Procedure PT Treatments $Therapeutic Activity: 8-22 mins   PT G Codes:   PT G-Codes **NOT FOR INPATIENT CLASS** Functional Assessment Tool Used: clinical judgement Functional Limitation: Mobility: Walking and moving around Mobility: Walking and Moving Around Current Status [id number]): At least 60 percent but less than 80 percent  impaired, limited or restricted Mobility: Walking and Moving Around Goal Status [phone]): At least 20 percent but less than 40 percent impaired, limited or restricted    [name] [name],KATHrine E 11/13/2016, 12:57 PM Carmelia Bake, PT, DPT 11/13/2016 Pager: [phone]

## 2016-11-13 NOTE — Progress Notes (Signed)
[facility] PROGRESS NOTE    Progress Note  [name] [name]  [id number] DOB: August 23, 1923 DOA: 11/12/2016 PCP: [name], MD     Brief Narrative:   [name] [name] is an 83 y.o. female past medical history of dementia, essential thrombocythemia, essential hypertension interstitial cystitis and a left bundle branch block which percent with syncope on the day of admission. There was an unwitnessed syncopal episode with the patient was found slumped over on her commode.  Assessment/Plan:  Syncope: 2-D echo done on 11/12/2016 showed an EF of 45% with grade 1 diastolic heart failure, aortic valve shows some sclerosis without stenosis. Normal motion abnormality. Fecal occult blood was negative, TSH was 0.8 EKG is EKG is unchanged from previous normal sinus rhythm left bundle branch block nonspecific T-wave changes She had an episode of V. tach overnight, will consult cardiology as we cannot increase her beta blocker. She is currently on a beta blocker, cannot increase further assess heart rate is 60s to 70s.  She is nonfocal on physical exam.  Facial laceration: It was repair In the emergency room, CT scan of the brain and face atrophy of her brain with a small laceration.  Abdominal pain: Two-view abdominal x-ray showed possible ileus.  Acute instability: TSH less than 1, physical therapy evaluation is pending.    Essential thrombocythemia ([place]) Continue anagrelide. Continue aspirin.  Dementia:    Acute renal failure with acute renal cortical necrosis superimposed on stage 3 chronic kidney disease ([place]) With a baseline creatinine of 0.1-1.1, on admission of 1.4, this is likely 2 in the setting of volume depletion and ibuprofen use. Orthostatics were checked 18 hours after admission and IV fluid hydration on not sure how reliable these at this point. We'll continue IV fluid hydration as his creatinine is slowly improving.   DVT prophylaxis: lovenox Family  Communication:grandson Disposition Plan/Barrier to D/C: home in am Code Status:     Code Status Orders        Start     Ordered   11/12/16 1232  Do not attempt resuscitation (DNR)  Continuous    Question Answer Comment  In the event of cardiac or respiratory ARREST Do not call a "code blue"   In the event of cardiac or respiratory ARREST Do not perform Intubation, CPR, defibrillation or ACLS   In the event of cardiac or respiratory ARREST Use medication by any route, position, wound care, and other measures to relive pain and suffering. May use oxygen, suction and manual treatment of airway obstruction as needed for comfort.      11/12/16 1231    Code Status History    Date Active Date Inactive Code Status Order ID Comments User Context   This patient has a current code status but no historical code status.    Advance Directive Documentation   Flowsheet Row Most Recent Value  Type of Advance Directive  Out of facility DNR (pink MOST or yellow form), Living will, Healthcare Power of Attorney  Pre-existing out of facility DNR order (yellow form or pink MOST form)  Yellow form placed in chart (order not valid for inpatient use)  "MOST" Form in Place?  No data        IV Access:    Peripheral IV   Procedures and diagnostic studies:   Ct Head Wo Contrast  Result Date: 11/12/2016 CLINICAL DATA:  Syncopal episode with fall EXAM: CT HEAD WITHOUT CONTRAST CT MAXILLOFACIAL WITHOUT CONTRAST TECHNIQUE: Multidetector CT imaging of the head and maxillofacial structures were  performed using the standard protocol without intravenous contrast. Multiplanar CT image reconstructions of the maxillofacial structures were also generated. COMPARISON:  Head CT January 08, 2016 FINDINGS: CT HEAD FINDINGS Brain: Moderate diffuse atrophy is stable. There is no intracranial mass, hemorrhage, extra-axial fluid collection, or midline shift. There is mild small vessel disease in the centra semiovale  bilaterally. Elsewhere gray-white compartments appear normal. No acute infarct evident. Vascular: There is no hyperdense vessel. There is calcification in each carotid siphon. Skull: The bony calvarium appears intact. There is a small benign enostosis arising in the right temporal region, stable. Other: Mastoid air cells are clear. CT MAXILLOFACIAL FINDINGS Osseous: There is no evident fracture or dislocation. No blastic or lytic bone lesions are evident. 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 No intraorbital lesions are appreciable. Sinuses: Paranasal sinuses are clear. Ostiomeatal unit complexes are patent bilaterally. There is edema along the inferior aspect of the right nasal turbinate. No nares obstruction. There is minimal rightward deviation of the nasal septum. Soft tissues: There is moderate soft tissue swelling and mild soft tissue air over the left lower to mid face region. No soft tissue mass or abscess present. There is subcutaneous thickening in the area of trauma over the mid face on the left. Salivary glands appear symmetric bilaterally. No adenopathy evident. Tongue and tongue base regions are unremarkable. Visualize pharynx appears normal. There is extensive arthropathy in the visualized cervical spine. IMPRESSION: CT head: Stable moderate generalized atrophy with mild periventricular small vessel disease. No intracranial mass, hemorrhage, or extra-axial fluid collection. No evident acute infarct. Vascular calcification noted in the carotid siphon regions. CT maxillofacial: Soft tissue edema with likely subcutaneous hemorrhage throughout the left lower to mid face region. A small amount of soft tissue air is noted in this region. No acute fracture or dislocation. No intraorbital lesion. Paranasal sinuses are clear. Ostiomeatal unit  complexes patent bilaterally. There is slight rightward deviation of the nasal septum. There is extensive arthropathy noted in the visualized cervical spine. 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 Electronically Signed   By: Lowella Grip III M.D.   On: 11/12/2016 16:14   Dg Abd 2 Views  Result Date: 11/12/2016 CLINICAL DATA:  Chronic constipation EXAM: ABDOMEN - 2 VIEW COMPARISON:  None. FINDINGS: Scattered large and small bowel gas is noted. Mild small bowel dilatation is seen. No free air is noted. No acute bony abnormality is seen. Degenerative change of the lumbar spine is noted. IMPRESSION: Mild small bowel dilatation is seen. Correlation with physical exam is recommended. This may represent a partial small bowel obstruction or small-bowel ileus. Electronically Signed   By: Inez Catalina M.D.   On: 11/12/2016 14:46   Ct Maxillofacial Wo Contrast  Result Date: 11/12/2016 CLINICAL DATA:  Syncopal episode with fall EXAM: CT HEAD WITHOUT CONTRAST CT MAXILLOFACIAL WITHOUT CONTRAST TECHNIQUE: Multidetector CT imaging of the head and maxillofacial structures were performed using the standard protocol without intravenous contrast. Multiplanar CT image reconstructions of the maxillofacial structures were also generated. COMPARISON:  Head CT January 08, 2016 FINDINGS: CT HEAD FINDINGS Brain: Moderate diffuse atrophy is stable. There is no intracranial mass, hemorrhage, extra-axial fluid collection, or midline shift. There is mild small vessel disease in the centra semiovale bilaterally. Elsewhere gray-white compartments appear normal. No acute infarct  evident. Vascular: There is no hyperdense vessel. There is calcification in each carotid siphon. Skull: The bony calvarium appears intact. There is a small benign enostosis arising in the right temporal region, stable. Other: Mastoid air cells are  clear. CT MAXILLOFACIAL FINDINGS Osseous: There is no evident fracture or dislocation. No blastic or lytic bone lesions are evident. 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 No intraorbital lesions are appreciable. Sinuses: Paranasal sinuses are clear. Ostiomeatal unit complexes are patent bilaterally. There is edema along the inferior aspect of the right nasal turbinate. No nares obstruction. There is minimal rightward deviation of the nasal septum. Soft tissues: There is moderate soft tissue swelling and mild soft tissue air over the left lower to mid face region. No soft tissue mass or abscess present. There is subcutaneous thickening in the area of trauma over the mid face on the left. Salivary glands appear symmetric bilaterally. No adenopathy evident. Tongue and tongue base regions are unremarkable. Visualize pharynx appears normal. There is extensive arthropathy in the visualized cervical spine. IMPRESSION: CT head: Stable moderate generalized atrophy with mild periventricular small vessel disease. No intracranial mass, hemorrhage, or extra-axial fluid collection. No evident acute infarct. Vascular calcification noted in the carotid siphon regions. CT maxillofacial: Soft tissue edema with likely subcutaneous hemorrhage throughout the left lower to mid face region. A small amount of soft tissue air is noted in this region. No acute fracture or dislocation. No intraorbital lesion. Paranasal sinuses are clear. Ostiomeatal unit complexes patent bilaterally. There is slight rightward deviation of the nasal septum. There is extensive arthropathy noted in the visualized cervical spine. 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 Electronically  Signed   By: Lowella Grip III M.D.   On: 11/12/2016 16:14     Medical Consultants:    None.  Anti-Infectives:   None  Subjective:    Mayuri Auten she denies any chest pain or shortness of breath during the episode, she related her pain is controlled.  Objective:    Vitals:   11/12/16 0908 11/12/16 1230 11/12/16 2043 11/13/16 0549  BP: 143/58 (!) 154/59 (!) 138/59   Pulse: 66 68 79   Resp: 15 16 18 20   Temp:  98.7 F (37.1 C) 98.9 F (37.2 C) 99.4 F (37.4 C)  TempSrc:  Oral Oral Oral  SpO2: 98% 98% 93% 93%  Weight:      Height:        Intake/Output Summary (Last 24 hours) at 11/13/16 1016 Last data filed at 11/12/16 2120  Gross per 24 hour  Intake              120 ml  Output                0 ml  Net              120 ml   Filed Weights   11/12/16 0603  Weight: 42.6 kg (94 lb)    Exam: General exam: In no acute distress. Respiratory system: Good air movement and clear to auscultation. Cardiovascular system: S1 & S2 heard, RRR. No JVD. Gastrointestinal system: Abdomen is nondistended, soft and nontender.  Central nervous system: Alert and oriented. No focal neurological deficits. Extremities: No pedal edema. Skin: No rashes, lesions or ulcers Psychiatry: Judgement and insight appear  normal. Mood & affect appropriate.    Data Reviewed:    Labs: Basic Metabolic Panel:  Recent Labs Lab 11/12/16 0813 11/13/16 0527  NA 143 149*  K 3.5 3.4*  CL 107 112*  CO2 24 24  GLUCOSE 112* 109*  BUN 55* 45*  CREATININE 1.45* 1.23*  CALCIUM 7.8* 7.9*  MG  --  2.3   GFR Estimated Creatinine Clearance: 20 mL/min (by C-G formula based on SCr of 1.23 mg/dL (H)). Liver Function Tests:  Recent Labs Lab 11/12/16 0813  AST 19  ALT 10*  ALKPHOS 57  BILITOT 0.6  PROT 6.9  ALBUMIN 3.8    Recent Labs Lab 11/12/16 0813  LIPASE 16   No results for input(s): AMMONIA in the last 168 hours. Coagulation profile  Recent Labs Lab 11/12/16 0813    INR 1.14    CBC:  Recent Labs Lab 11/12/16 0813  WBC 8.3  NEUTROABS 6.7  HGB 10.1*  HCT 32.7*  MCV 93.7  PLT 275   Cardiac Enzymes: No results for input(s): CKTOTAL, CKMB, CKMBINDEX, TROPONINI in the last 168 hours. BNP (last 3 results) No results for input(s): PROBNP in the last 8760 hours. CBG: No results for input(s): GLUCAP in the last 168 hours. D-Dimer: No results for input(s): DDIMER in the last 72 hours. Hgb A1c: No results for input(s): HGBA1C in the last 72 hours. Lipid Profile: No results for input(s): CHOL, HDL, LDLCALC, TRIG, CHOLHDL, LDLDIRECT in the last 72 hours. Thyroid function studies:  Recent Labs  11/12/16 1247  TSH 0.850   Anemia work up: No results for input(s): VITAMINB12, FOLATE, FERRITIN, TIBC, IRON, RETICCTPCT in the last 72 hours. Sepsis Labs:  Recent Labs Lab 11/12/16 0813  WBC 8.3   Microbiology Recent Results (from the past 240 hour(s))  MRSA PCR Screening     Status: None   Collection Time: 11/12/16 12:33 PM  Result Value Ref Range Status   MRSA by PCR NEGATIVE NEGATIVE Final    Comment:        The GeneXpert MRSA Assay (FDA approved for NASAL specimens only), is one component of a comprehensive MRSA colonization surveillance program. It is not intended to diagnose MRSA infection nor to guide or monitor treatment for MRSA infections.      Medications:   . anagrelide  3 mg Oral Daily  . atenolol  25 mg Oral BID  . calcitonin (salmon)  1 spray Alternating Nares Daily  . calcium citrate-vitamin D  1 tablet Oral BID WC  . donepezil  10 mg Oral QHS  . dorzolamide-timolol  1 drop Both Eyes BID  . enoxaparin (LOVENOX) injection  20 mg Subcutaneous Q24H  . feeding supplement  240 mL Oral BID BM  . latanoprost  1 drop Both Eyes QHS  . Magnesium  1 tablet Oral Daily  . memantine  5 mg Oral BID  . mirtazapine  15 mg Oral QHS  . polyethylene glycol  17 g Oral Daily  . sodium chloride flush  3 mL Intravenous Q12H  .  vitamin E  400 Units Oral Daily   Continuous Infusions: . dextrose      Time spent: 25 min   LOS: 0 days   Charlynne Cousins  [facility] Pager [phone]  *Please refer to [email or web address], password [email or web address] to get updated schedule on who will round on this patient, as hospitalists switch teams weekly. If 7PM-7AM, please contact night-coverage at [email or web address], password [email or web address] for any overnight needs.  11/13/2016, 10:16 AM

## 2016-11-13 NOTE — Progress Notes (Signed)
The orthostatic vitals were texted to the MD on call. No new orders.

## 2016-11-13 NOTE — Clinical Social Work Note (Signed)
Clinical Social Work Assessment  Patient Details  Name: [name] [name] MRN: [id number] Date of Birth: January 14, 1923  Date of referral:  11/13/16               Reason for consult:  Discharge Planning                Permission sought to share information with:  Family Supports, Case Freight forwarder, Chartered certified accountant granted to share information::  Yes, Verbal Permission Granted  Name::      Higher education careers adviser)  Agency::   ([facility]- SNF )  Relationship::   (Son)  Contact Information:   [phone])  Housing/Transportation Living arrangements for the past 2 months:  [place] of Information:  Facility Patient Interpreter Needed:  None Criminal Activity/Legal Involvement Pertinent to Current Situation/Hospitalization:  No - Comment as needed Significant Relationships:  Adult Children Lives with:  Facility Resident Do you feel safe going back to the place where you live?  Yes Need for family participation in patient care:  No (Coment)  Care giving concerns:  Admitted from [facility] and plans to return under skilled care.    Social Worker assessment / plan:  Patient to return to [facility] under skilled care. Facility prepared to readmit once stable. No further concerns reported. MSW remains available as needed.    Employment status:  Retired Nurse, adult PT Recommendations:  [place] / Referral to community resources:  [place]  Patient/Family's Response to care:  Unable to reach family at this time.   Patient/Family's Understanding of and Emotional Response to Diagnosis, Current Treatment, and Prognosis:  Unknown.   Emotional Assessment Appearance:  Appears stated age Attitude/Demeanor/Rapport:  Unable to Assess Affect (typically observed):  Unable to Assess Orientation:  Oriented to Self Alcohol / Substance use:  Not Applicable Psych involvement (Current and /or in the community):  No  (Comment)  Discharge Needs  Concerns to be addressed:  Denies Needs/Concerns at this time Readmission within the last 30 days:  No Current discharge risk:  Dependent with Mobility Barriers to Discharge:  Continued Medical Work up   Glendon Axe A 11/13/2016, 3:15 PM

## 2016-11-13 NOTE — Progress Notes (Addendum)
Initial Nutrition Assessment  DOCUMENTATION CODES:   Severe malnutrition in context of chronic illness  INTERVENTION:   Boost Breeze po TID, each supplement provides 250 kcal and 9 grams of protein  Ensure Enlive po TID, each supplement provides 350 kcal and 20 grams of protein  Magic cup TID with meals, each supplement provides 290 kcal and 9 grams of protein  Downgrade to DYS 3 diet per family request.   NUTRITION DIAGNOSIS:   Malnutrition related to lethargy/confusion, chronic illness as evidenced by severe depletion of body fat, severe depletion of muscle mass.  GOAL:   Patient will meet greater than or equal to 90% of their needs  MONITOR:   PO intake, Supplement acceptance  REASON FOR ASSESSMENT:   Other (Comment) (Low BMI)    ASSESSMENT:   83 y.o. female with medical history of dementia, essential thrombocythemia, hypertension, interstitial cystitis, and LBBB presented with a syncopal episode earlier on the day of admission. On 11/11/2016, the patient had an unwitnessed syncopal episode. She was found slumped over her commode. The patient was moved from assisted living to a skilled nursing facility level of care at [facility]. Unfortunately, around 4:30 on the morning of admission, the patient once again was found on the commode slumped over. Apparently, the patient was conscious at the time but was found to have a laceration on the left side of her face.   Met with pt and pts grandson in room today. Pt eating fair pta at SNF. Family reports poor appetite for 1 month pta. Per family, pt is wt stable. Pt likes Ensure mixed with ice cream. Pt on remeron. Grandson reports pt having trouble chewing tough meats. Unsure if pt drinking BB. Will continue to send just in case.   Medications reviewed and include: calcitonin, Ca citrate-Vit D, lovenox, Mg tabs, remeron, miralax, vit E  Labs reviewed: Na 149(H), K 3.4(L), Cl 112(H), BUN 45(H), creat 1.23(H), Ca 7.9(L), Mg 2.3  wnl  Nutrition-Focused physical exam completed. Findings are severe fat depletion, severe muscle depletion, and no edema.   Diet Order:  Diet Heart Room service appropriate? Yes; Fluid consistency: Thin  Skin:  Wound (see comment) (cheek laceration )  Last BM:  1/5  Height:   Ht Readings from Last 1 Encounters:  11/12/16 _0  (1.549 m)    Weight:   Wt Readings from Last 1 Encounters:  11/12/16 94 lb (42.6 kg)    Ideal Body Weight:  47.7 kg  BMI:  Body mass index is 17.76 kg/m.  Estimated Nutritional Needs:   Kcal:  1400-1700kcal/day   Protein:  60-68g/day   Fluid:  >1.4L/day   EDUCATION NEEDS:   No education needs identified at this time  [name] [name], RD, LDN Pager #[phone]

## 2016-11-13 NOTE — Consult Note (Signed)
Cardiology Consult    Patient ID: [name] [name] MRN: [id number], DOB/AGE: 1922/12/09   Admit date: 11/12/2016 Date of Consult: 11/13/2016  Primary Physician: [name], MD Reason for Consult: syncope, NSVT  Primary Cardiologist: New  Requesting Provider: Dr. Olevia Bowens.   Patient Profile    [name] [name] is a 83 year old female with a past medical history of dementia, LBBB and HTN. She presented with an unwitnessed syncopal episode when she was found slumped over on the toilet.   History of Present Illness    [name] [name] has dementia so history is limited as there is no family at the bedside. She denies chest pain and SOB. She is resting comfortably.   Echo done yesterday revealed an EF of 45% with grade 1 diastolic dysfunction. No wall motion abnormalities or AS. She had a run of NSVT this am on telemetry of about 12-14 beats. She was asymptomatic and hemodynamically stable.   Her EKG shows NSR with LBBB, this is consistent with her prior EKG's.   Past Medical History   Past Medical History:  Diagnosis Date  . Abdominal pain, unspecified site 08/11/2012  . Anemia, unspecified 06/18/2011  . Arthritis   . BBB (bundle branch block) 03/03/2006  . Cardiac dysrhythmia, unspecified 03/03/2006  . Chronic interstitial cystitis 03/03/2006  . 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 Disorder of bone and cartilage, unspecified 04/20/2007  . Diverticulosis of colon (without mention of hemorrhage) 04/20/2007  . Essential thrombocythemia ([place]) 06/18/2011  . Glaucoma   . Hematuria, unspecified 04/22/2004  . Hemorrhage of gastrointestinal tract, unspecified 03/09/2007  . Herpes zoster without mention of complication [id number]  . Hypertension   . 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 Loss of weight 06/18/2011  . Lumbago 12/18/2010  . Memory loss 12/19/2009  . Neck fracture ([place])   . Osteoarthrosis, unspecified whether generalized or localized, unspecified site 03/03/2006  . Other and unspecified  hyperlipidemia 09/01/2006  . Sciatica 06/19/2010    Past Surgical History:  Procedure Laterality Date  . ABDOMINAL HYSTERECTOMY    . APPENDECTOMY    . BREAST CYST EXCISION Bilateral    benign  . CATARACT EXTRACTION W/ INTRAOCULAR LENS  IMPLANT, BILATERAL    . TONSILLECTOMY       Allergies  Allergies  Allergen Reactions  . Darvocet [Propoxyphene N-Acetaminophen] Nausea And Vomiting  . Darvon Nausea And Vomiting  . Meperidine And Related Nausea And Vomiting  . Vicodin [Hydrocodone-Acetaminophen] Nausea And Vomiting  . Demerol [Meperidine]     Inpatient Medications    . anagrelide  3 mg Oral Daily  . atenolol  25 mg Oral BID  . calcitonin (salmon)  1 spray Alternating Nares Daily  . calcium citrate-vitamin D  1 tablet Oral BID WC  . donepezil  10 mg Oral QHS  . dorzolamide-timolol  1 drop Both Eyes BID  . enoxaparin (LOVENOX) injection  20 mg Subcutaneous Q24H  . feeding supplement  240 mL Oral BID BM  . latanoprost  1 drop Both Eyes QHS  . Magnesium  1 tablet Oral Daily  . memantine  5 mg Oral BID  . mirtazapine  15 mg Oral QHS  . polyethylene glycol  17 g Oral Daily  . sodium chloride flush  3 mL Intravenous Q12H  . vitamin E  400 Units Oral Daily    Family History    Family History  Problem Relation Age of Onset  . Heart disease Father     MI    Social History  Social History   Social History  . Marital status: Widowed    Spouse name: N/A  . Number of children: N/A  . Years of education: N/A   Occupational History  . Not on file.   Social History Main Topics  . Smoking status: Never Smoker  . Smokeless tobacco: Never Used  . Alcohol use No  . Drug use: No  . Sexual activity: No   Other Topics Concern  . 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  Cardiovascular:  No chest pain, dyspnea on exertion, edema, orthopnea, palpitations, paroxysmal nocturnal dyspnea. Dermatological: No rash, lesions/masses Respiratory: No cough, dyspnea Urologic: No hematuria, dysuria Abdominal:   No nausea, vomiting, diarrhea, bright red blood per rectum, melena, or hematemesis Neurologic:  No visual changes, wkns, changes in mental status. All other systems reviewed and are otherwise negative except as noted above.  Physical Exam    Blood pressure (!) 138/59, pulse 79, temperature 99.4 F (37.4 C), temperature source Oral, resp. rate 20, height 5\' 1"  (1.549 m), weight 94 lb (42.6 kg), SpO2 93 %.  General: Pleasant, elderly female in no acute distress.  Psych: Normal affect. Neuro: Disoriented to situation. Moves all extremities spontaneously. HEENT: Normal  Neck: Supple without bruits or JVD. Lungs:  Resp regular and unlabored, CTA. Heart: RRR no s3, s4, or murmurs. Abdomen: Soft, non-tender, non-distended, BS + x 4.  Extremities: No clubbing, cyanosis or edema. DP/PT/Radials 2+ and equal bilaterally.  Labs     Lab Results  Component Value Date   WBC 8.3 11/12/2016   HGB 10.1 (L) 11/12/2016   HCT 32.7 (L) 11/12/2016   MCV 93.7 11/12/2016   PLT 275 11/12/2016    Recent Labs Lab 11/12/16 0813 11/13/16 0527  NA 143 149*  K 3.5 3.4*  CL 107 112*  CO2 24 24  BUN 55* 45*  CREATININE 1.45* 1.23*  CALCIUM 7.8* 7.9*  PROT 6.9  --   BILITOT 0.6  --   ALKPHOS 57  --   ALT 10*  --   AST 19  --   GLUCOSE 112* 109*   Lab Results  Component Value Date   CHOL 174 07/14/2016   HDL 59 07/14/2016   LDLCALC 92 07/14/2016   TRIG 117 07/14/2016   No results found for: [facility]   Radiology Studies    Ct Head Wo Contrast  Result Date: 11/12/2016 CLINICAL DATA:  Syncopal episode with fall EXAM: CT HEAD WITHOUT CONTRAST CT MAXILLOFACIAL WITHOUT CONTRAST TECHNIQUE: Multidetector CT imaging of the  head and maxillofacial structures were performed using the standard protocol without intravenous contrast. Multiplanar CT image reconstructions of the maxillofacial structures were also generated. COMPARISON:  Head CT January 08, 2016 FINDINGS: CT HEAD FINDINGS Brain: Moderate diffuse atrophy is stable. There is no intracranial mass, hemorrhage, extra-axial fluid collection, or midline shift. There is mild small vessel disease in the centra semiovale bilaterally. Elsewhere gray-white compartments appear normal. No acute infarct evident. Vascular: There is no hyperdense vessel. There is calcification in each carotid siphon. Skull: The bony calvarium appears intact. There is a small benign enostosis arising in the right temporal region, stable. Other: Mastoid air cells are  clear. CT MAXILLOFACIAL FINDINGS Osseous: There is no evident fracture or dislocation. No blastic or lytic bone lesions are evident. 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 No intraorbital lesions are appreciable. Sinuses: Paranasal sinuses are clear. Ostiomeatal unit complexes are patent bilaterally. There is edema along the inferior aspect of the right nasal turbinate. No nares obstruction. There is minimal rightward deviation of the nasal septum. Soft tissues: There is moderate soft tissue swelling and mild soft tissue air over the left lower to mid face region. No soft tissue mass or abscess present. There is subcutaneous thickening in the area of trauma over the mid face on the left. Salivary glands appear symmetric bilaterally. No adenopathy evident. Tongue and tongue base regions are unremarkable. Visualize pharynx appears normal. There is extensive arthropathy in the visualized cervical spine. IMPRESSION: CT head: Stable moderate generalized atrophy with mild periventricular small  vessel disease. No intracranial mass, hemorrhage, or extra-axial fluid collection. No evident acute infarct. Vascular calcification noted in the carotid siphon regions. CT maxillofacial: Soft tissue edema with likely subcutaneous hemorrhage throughout the left lower to mid face region. A small amount of soft tissue air is noted in this region. No acute fracture or dislocation. No intraorbital lesion. Paranasal sinuses are clear. Ostiomeatal unit complexes patent bilaterally. There is slight rightward deviation of the nasal septum. There is extensive arthropathy noted in the visualized cervical spine. 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 Electronically Signed   By: Lowella Grip III M.D.   On: 11/12/2016 16:14   Dg Abd 2 Views  Result Date: 11/12/2016 CLINICAL DATA:  Chronic constipation EXAM: ABDOMEN - 2 VIEW COMPARISON:  None. FINDINGS: Scattered large and small bowel gas is noted. Mild small bowel dilatation is seen. No free air is noted. No acute bony abnormality is seen. Degenerative change of the lumbar spine is noted. IMPRESSION: Mild small bowel dilatation is seen. Correlation with physical exam is recommended. This may represent a partial small bowel obstruction or small-bowel ileus. Electronically Signed   By: Inez Catalina M.D.   On: 11/12/2016 14:46   Ct Maxillofacial Wo Contrast  Result Date: 11/12/2016 CLINICAL DATA:  Syncopal episode with fall EXAM: CT HEAD WITHOUT CONTRAST CT MAXILLOFACIAL WITHOUT CONTRAST TECHNIQUE: Multidetector CT imaging of the head and maxillofacial structures were performed using the standard protocol without intravenous contrast. Multiplanar CT image reconstructions of the maxillofacial structures were also generated. COMPARISON:  Head CT January 08, 2016 FINDINGS: CT HEAD FINDINGS Brain: Moderate diffuse atrophy is stable. There is no intracranial mass,  hemorrhage, extra-axial fluid collection, or midline shift. There is mild small vessel disease in the centra semiovale bilaterally. Elsewhere gray-white compartments appear normal. No acute infarct evident. Vascular: There is no hyperdense vessel. There is calcification in each carotid siphon. Skull: The bony calvarium appears intact. There is a small benign enostosis arising in the right temporal region, stable. Other: Mastoid air cells are clear. CT MAXILLOFACIAL FINDINGS Osseous: There is no evident fracture or dislocation. No blastic or lytic bone lesions are evident. 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 No intraorbital lesions are appreciable. Sinuses: Paranasal sinuses are clear. Ostiomeatal unit complexes are patent bilaterally. There is edema  along the inferior aspect of the right nasal turbinate. No nares obstruction. There is minimal rightward deviation of the nasal septum. Soft tissues: There is moderate soft tissue swelling and mild soft tissue air over the left lower to mid face region. No soft tissue mass or abscess present. There is subcutaneous thickening in the area of trauma over the mid face on the left. Salivary glands appear symmetric bilaterally. No adenopathy evident. Tongue and tongue base regions are unremarkable. Visualize pharynx appears normal. There is extensive arthropathy in the visualized cervical spine. IMPRESSION: CT head: Stable moderate generalized atrophy with mild periventricular small vessel disease. No intracranial mass, hemorrhage, or extra-axial fluid collection. No evident acute infarct. Vascular calcification noted in the carotid siphon regions. CT maxillofacial: Soft tissue edema with likely subcutaneous hemorrhage throughout the left lower to mid face region. A small amount of soft tissue air is noted in  this region. No acute fracture or dislocation. No intraorbital lesion. Paranasal sinuses are clear. Ostiomeatal unit complexes patent bilaterally. There is slight rightward deviation of the nasal septum. There is extensive arthropathy noted in the visualized cervical spine. 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 Electronically Signed   By: Lowella Grip III M.D.   On: 11/12/2016 16:14    EKG & Cardiac Imaging    EKG: NSR, LBBB  Echocardiogram: 11/12/16 Study Conclusions  - Left ventricle: The cavity size was normal. Wall thickness was   increased in a pattern of moderate LVH. Systolic function was   mildly reduced. The estimated ejection fraction was in the range   of 45% to 50%. Mildly reduced GLPSS at -18%, with anteroseptal   strain abnormality. Incoordinate septal motion. Doppler   parameters are consistent with abnormal left ventricular   relaxation (grade 1 diastolic dysfunction). The E/e&' ratio is   between 8-15, suggesting indeterminate LV filling pressure. - Aortic valve: Sclerosis without stenosis. There was mild   regurgitation. - Mitral valve: Mildly thickened leaflets . There was mild to   moderate regurgitation. - Left atrium: The atrium was moderately dilated. - Tricuspid valve: There was mild regurgitation. - Pulmonary arteries: PA peak pressure: 47 mm Hg (S). - Inferior vena cava: The vessel was normal in size. The   respirophasic diameter changes were in the normal range (>= 50%),   consistent with normal central venous pressure.  Impressions:  - LVEF 45-50%, moderate LVH, incoordinate septal motion secondary   to LBBB, diastolic dysfunction, indeterminate LV filling   pressure, mild AI, mild to moderate MR, moderate LAE, mild TR,   RVSP 47 mmHg, normal IVC.   Assessment & Plan    1. Syncope: EF is 45% without wall motion abnormality.  Could wear 30 day monitor but I worry with her dementia she will not keep It on. Does not appear to be a good candidate for an ischemic evaluation.   Could preform carotid dopplers as outpatient to rule out any carotid artery disease. Could also be vasovagal as she was found on the commode and has a history of constipation.   2. NSVT: Will check mag level. HR in the past 12 hours has been above 65, mostly in the 80's. Can increase atenolol to 50mg  BID.   3. Acute on chronic kidney disease stage 3: Creatinine is 1.23, improved from admission.   Signed, Arbutus Leas, NP 11/13/2016, 1:40 PM Pager: [phone]  Personally seen and examined. Agree with above.  Syncope - found slumped over toilet.  - Would not subject her to 30 day event monitor as compliance would be very challenging given her dementia  - LBBB at baseline and NSVT noted on tele.   - Conduction disease noted but no significant pauses or bradycardia noted.  - Will keep low dose atenolol at 25 BID, Bb, on board with her run of NSVT. No increase at this time.   - May have been vagal with superimposed dehydration (creat elevated on admit). Her family member notes how dry her mouth has been and appetite has been down.   - No indication for pacemaker at this time.   Will sign off. Please call if any ?  Candee Furbish, MD

## 2016-11-13 NOTE — NC FL2 (Signed)
[place] LEVEL OF CARE SCREENING TOOL     IDENTIFICATION  Patient Name: [name] [name] Birthdate: 01-31-1923 Sex: female Admission Date (Current Location): 11/12/2016  [facility] and [place] Number:  Herbalist and Address:  [facility]  [address], [place]      Provider Number: [id number]  Attending Physician Name and Address:  Charlynne Cousins, MD  Relative Name and Phone Number:       Current Level of Care: Hospital Recommended Level of Care: [place] Prior Approval Number:    Date Approved/Denied:   PASRR Number:    Discharge Plan: SNF    Current Diagnoses: Patient Active Problem List   Diagnosis Date Noted  . Syncope 11/12/2016  . Acute renal failure with acute renal cortical necrosis superimposed on stage 3 chronic kidney disease ([place]) 11/12/2016  . Acute renal failure superimposed on stage 3 chronic kidney disease ([place]) 11/12/2016  . Dehydration   . Facial laceration   . Syncope and collapse   . Face lacerations   . Adult failure to thrive 11/11/2016  . Constipation 11/11/2016  . Insomnia 09/17/2016  . Dementia 01/20/2016  . Laceration of head 01/13/2016  . Adjustment disorder with mixed anxiety and depressed mood 12/26/2015  . UTI (urinary tract infection) 12/19/2015  . Osteoporosis 12/19/2015  . Essential thrombocythemia ([place]) 09/28/2014  . Hoarse 05/07/2013  . Hypertension   . Glaucoma 01/06/2012  . Anemia 06/18/2011  . Loss of weight 06/18/2011  . Memory loss 12/19/2009  . Hyperlipidemia 09/01/2006    Orientation RESPIRATION BLADDER Height & Weight     Self, Time, Place  O2 (at 2L) Continent Weight: 94 lb (42.6 kg) Height:  5\' 1"  (154.9 cm)  BEHAVIORAL SYMPTOMS/MOOD NEUROLOGICAL BOWEL NUTRITION STATUS   (none)  (none) Continent Diet (DYS 3)  AMBULATORY STATUS COMMUNICATION OF NEEDS Skin   Extensive Assist Verbally Normal                       Personal Care  Assistance Level of Assistance  Bathing, Dressing, Feeding Bathing Assistance: Maximum assistance Feeding assistance: Limited assistance Dressing Assistance: Maximum assistance     Functional Limitations Info  Speech, Hearing, Sight Sight Info: Adequate Hearing Info: Adequate Speech Info: Adequate    SPECIAL CARE FACTORS FREQUENCY  PT (By licensed PT)     PT Frequency: 3              Contractures      Additional Factors Info  Code Status, Allergies Code Status Info: DNR CODE  Allergies Info: Darvocet Propoxyphene N-acetaminophen, Darvon, Meperidine And Related, Vicodin Hydrocodone-acetaminophen, Demerol Meperidine           Current Medications (11/13/2016):  This is the current hospital active medication list Current Facility-Administered Medications  Medication Dose Route Frequency Provider Last Rate Last Dose  . acetaminophen (TYLENOL) tablet 650 mg  650 mg Oral Q6H PRN Orson Eva, MD       Or  . acetaminophen (TYLENOL) suppository 650 mg  650 mg Rectal Q6H PRN Orson Eva, MD      . anagrelide (AGRYLIN) capsule 3 mg  3 mg Oral Daily Orson Eva, MD   3 mg at 11/13/16 1213  . atenolol (TENORMIN) tablet 25 mg  25 mg Oral BID Orson Eva, MD   25 mg at 11/13/16 1215  . calcitonin (salmon) (MIACALCIN/FORTICAL) nasal spray 1 spray  1 spray Alternating Nares Daily Orson Eva, MD   1  spray at 11/12/16 1843  . calcium citrate-vitamin D 500-400 MG-UNIT per chewable tablet 1 tablet  1 tablet Oral BID WC Orson Eva, MD   1 tablet at 11/13/16 1219  . dextrose 5 % solution   Intravenous Continuous Charlynne Cousins, MD 50 mL/hr at 11/13/16 1213    . donepezil (ARICEPT) tablet 10 mg  10 mg Oral QHS Orson Eva, MD   10 mg at 11/12/16 2220  . dorzolamide-timolol (COSOPT) 22.3-6.8 MG/ML ophthalmic solution 1 drop  1 drop Both Eyes BID Orson Eva, MD   1 drop at 11/13/16 1219  . enoxaparin (LOVENOX) injection 20 mg  20 mg Subcutaneous Q24H Orson Eva, MD   20 mg at 11/12/16 1814  . feeding  supplement (BOOST / RESOURCE BREEZE) liquid 1 Container  240 mL Oral BID BM Orson Eva, MD   1 Container at 11/12/16 1403  . feeding supplement (ENSURE ENLIVE) (ENSURE ENLIVE) liquid 237 mL  237 mL Oral TID BM Charlynne Cousins, MD      . latanoprost (XALATAN) 0.005 % ophthalmic solution 1 drop  1 drop Both Eyes QHS Orson Eva, MD   1 drop at 11/12/16 2200  . Magnesium TABS 200 mg  1 tablet Oral Daily Orson Eva, MD   200 mg at 11/13/16 1216  . memantine (NAMENDA) tablet 5 mg  5 mg Oral BID Charlynne Cousins, MD   5 mg at 11/13/16 1214  . mirtazapine (REMERON) tablet 15 mg  15 mg Oral QHS Orson Eva, MD   15 mg at 11/12/16 2220  . ondansetron (ZOFRAN) tablet 4 mg  4 mg Oral Q6H PRN Orson Eva, MD       Or  . ondansetron [facility]) injection 4 mg  4 mg Intravenous Q6H PRN Orson Eva, MD   4 mg at 11/12/16 1817  . polyethylene glycol (MIRALAX / GLYCOLAX) packet 17 g  17 g Oral Daily Orson Eva, MD   17 g at 11/12/16 1816  . sodium chloride flush (NS) 0.9 % injection 3 mL  3 mL Intravenous Q12H Orson Eva, MD   3 mL at 11/12/16 2200  . vitamin E capsule 400 Units  400 Units Oral Daily Orson Eva, MD   400 Units at 11/13/16 1216     Discharge Medications: Please see discharge summary for a list of discharge medications.  Relevant Imaging Results:  Relevant Lab Results:   Additional Information SSN [id number]  Glendon Axe A

## 2016-11-14 ENCOUNTER — Observation Stay (HOSPITAL_COMMUNITY): Payer: Medicare Other

## 2016-11-14 DIAGNOSIS — J189 Pneumonia, unspecified organism: Secondary | ICD-10-CM | POA: Diagnosis present

## 2016-11-14 DIAGNOSIS — N183 Chronic kidney disease, stage 3 (moderate): Secondary | ICD-10-CM | POA: Diagnosis present

## 2016-11-14 DIAGNOSIS — E860 Dehydration: Secondary | ICD-10-CM | POA: Diagnosis present

## 2016-11-14 DIAGNOSIS — M899 Disorder of bone, unspecified: Secondary | ICD-10-CM | POA: Diagnosis present

## 2016-11-14 DIAGNOSIS — I447 Left bundle-branch block, unspecified: Secondary | ICD-10-CM | POA: Diagnosis present

## 2016-11-14 DIAGNOSIS — F0390 Unspecified dementia without behavioral disturbance: Secondary | ICD-10-CM | POA: Diagnosis present

## 2016-11-14 DIAGNOSIS — E43 Unspecified severe protein-calorie malnutrition: Secondary | ICD-10-CM | POA: Insufficient documentation

## 2016-11-14 DIAGNOSIS — E785 Hyperlipidemia, unspecified: Secondary | ICD-10-CM | POA: Diagnosis present

## 2016-11-14 DIAGNOSIS — Z66 Do not resuscitate: Secondary | ICD-10-CM | POA: Diagnosis present

## 2016-11-14 DIAGNOSIS — I472 Ventricular tachycardia: Secondary | ICD-10-CM | POA: Diagnosis not present

## 2016-11-14 DIAGNOSIS — W228XXA Striking against or struck by other objects, initial encounter: Secondary | ICD-10-CM | POA: Diagnosis present

## 2016-11-14 DIAGNOSIS — Y92002 Bathroom of unspecified non-institutional (private) residence single-family (private) house as the place of occurrence of the external cause: Secondary | ICD-10-CM | POA: Diagnosis not present

## 2016-11-14 DIAGNOSIS — R55 Syncope and collapse: Secondary | ICD-10-CM | POA: Diagnosis present

## 2016-11-14 DIAGNOSIS — Z681 Body mass index (BMI) 19 or less, adult: Secondary | ICD-10-CM | POA: Diagnosis not present

## 2016-11-14 DIAGNOSIS — N171 Acute kidney failure with acute cortical necrosis: Secondary | ICD-10-CM | POA: Diagnosis present

## 2016-11-14 DIAGNOSIS — R627 Adult failure to thrive: Secondary | ICD-10-CM | POA: Diagnosis present

## 2016-11-14 DIAGNOSIS — I130 Hypertensive heart and chronic kidney disease with heart failure and stage 1 through stage 4 chronic kidney disease, or unspecified chronic kidney disease: Secondary | ICD-10-CM | POA: Diagnosis present

## 2016-11-14 DIAGNOSIS — S01412A Laceration without foreign body of left cheek and temporomandibular area, initial encounter: Secondary | ICD-10-CM | POA: Diagnosis present

## 2016-11-14 DIAGNOSIS — G9340 Encephalopathy, unspecified: Secondary | ICD-10-CM | POA: Diagnosis present

## 2016-11-14 DIAGNOSIS — N179 Acute kidney failure, unspecified: Secondary | ICD-10-CM | POA: Diagnosis not present

## 2016-11-14 DIAGNOSIS — R269 Unspecified abnormalities of gait and mobility: Secondary | ICD-10-CM | POA: Diagnosis present

## 2016-11-14 DIAGNOSIS — D473 Essential (hemorrhagic) thrombocythemia: Secondary | ICD-10-CM | POA: Diagnosis present

## 2016-11-14 DIAGNOSIS — I5030 Unspecified diastolic (congestive) heart failure: Secondary | ICD-10-CM | POA: Diagnosis present

## 2016-11-14 DIAGNOSIS — H409 Unspecified glaucoma: Secondary | ICD-10-CM | POA: Diagnosis present

## 2016-11-14 DIAGNOSIS — K5900 Constipation, unspecified: Secondary | ICD-10-CM | POA: Diagnosis present

## 2016-11-14 DIAGNOSIS — S0181XA Laceration without foreign body of other part of head, initial encounter: Secondary | ICD-10-CM | POA: Diagnosis not present

## 2016-11-14 DIAGNOSIS — N3010 Interstitial cystitis (chronic) without hematuria: Secondary | ICD-10-CM | POA: Diagnosis present

## 2016-11-14 DIAGNOSIS — D638 Anemia in other chronic diseases classified elsewhere: Secondary | ICD-10-CM | POA: Diagnosis present

## 2016-11-14 LAB — BASIC METABOLIC PANEL
Anion gap: 8 (ref 5–15)
BUN: 51 mg/dL — AB (ref 6–20)
CALCIUM: 7.7 mg/dL — AB (ref 8.9–10.3)
CHLORIDE: 108 mmol/L (ref 101–111)
CO2: 26 mmol/L (ref 22–32)
CREATININE: 1.69 mg/dL — AB (ref 0.44–1.00)
GFR calc non Af Amer: 25 mL/min — ABNORMAL LOW (ref >60)
GFR, EST AFRICAN AMERICAN: 29 mL/min — AB (ref >60)
Glucose, Bld: 113 mg/dL — ABNORMAL HIGH (ref 65–99)
Potassium: 3.1 mmol/L — ABNORMAL LOW (ref 3.5–5.1)
SODIUM: 142 mmol/L (ref 135–145)

## 2016-11-14 LAB — URINALYSIS, ROUTINE W REFLEX MICROSCOPIC
BILIRUBIN URINE: NEGATIVE
Glucose, UA: NEGATIVE mg/dL
HGB URINE DIPSTICK: NEGATIVE
Ketones, ur: NEGATIVE mg/dL
Leukocytes, UA: NEGATIVE
NITRITE: NEGATIVE
PROTEIN: NEGATIVE mg/dL
Specific Gravity, Urine: 1.02 (ref 1.005–1.030)
pH: 5.5 (ref 5.0–8.0)

## 2016-11-14 LAB — CBC
HEMATOCRIT: 30.2 % — AB (ref 36.0–46.0)
Hemoglobin: 9.3 g/dL — ABNORMAL LOW (ref 12.0–15.0)
MCH: 29.4 pg (ref 26.0–34.0)
MCHC: 30.8 g/dL (ref 30.0–36.0)
MCV: 95.6 fL (ref 78.0–100.0)
PLATELETS: 210 10*3/uL (ref 150–400)
RBC: 3.16 MIL/uL — AB (ref 3.87–5.11)
RDW: 18.8 % — AB (ref 11.5–15.5)
WBC: 10.9 10*3/uL — AB (ref 4.0–10.5)

## 2016-11-14 LAB — LACTIC ACID, PLASMA: Lactic Acid, Venous: 1.3 mmol/L (ref 0.5–1.9)

## 2016-11-14 MED ORDER — PIPERACILLIN-TAZOBACTAM IN DEX 2-0.25 GM/50ML IV SOLN
2.2500 g | Freq: Four times a day (QID) | INTRAVENOUS | Status: DC
  Administered 2016-11-14 – 2016-11-15 (×2): 2.25 g via INTRAVENOUS
  Filled 2016-11-14 (×3): qty 50

## 2016-11-14 MED ORDER — VANCOMYCIN HCL IN DEXTROSE 750-5 MG/150ML-% IV SOLN
750.0000 mg | Freq: Once | INTRAVENOUS | Status: AC
  Administered 2016-11-14: 750 mg via INTRAVENOUS
  Filled 2016-11-14: qty 150

## 2016-11-14 MED ORDER — VANCOMYCIN HCL 500 MG IV SOLR: 500.0000 mg | INTRAVENOUS | Status: DC

## 2016-11-14 MED ORDER — METOPROLOL TARTRATE 50 MG PO TABS
50.0000 mg | ORAL_TABLET | Freq: Two times a day (BID) | ORAL | Status: DC
  Administered 2016-11-14 – 2016-11-17 (×7): 50 mg via ORAL
  Filled 2016-11-14 (×7): qty 1

## 2016-11-14 MED ORDER — POTASSIUM CHLORIDE CRYS ER 20 MEQ PO TBCR
40.0000 meq | EXTENDED_RELEASE_TABLET | Freq: Two times a day (BID) | ORAL | Status: AC
  Administered 2016-11-14 (×2): 40 meq via ORAL
  Filled 2016-11-14 (×2): qty 2

## 2016-11-14 MED ORDER — ACETAMINOPHEN 650 MG RE SUPP
650.0000 mg | RECTAL | Status: DC | PRN
  Administered 2016-11-14: 650 mg via RECTAL
  Filled 2016-11-14: qty 1

## 2016-11-14 MED ORDER — METOPROLOL TARTRATE 50 MG PO TABS: 50.0000 mg | ORAL_TABLET | Freq: Two times a day (BID) | ORAL | 3 refills | Status: AC

## 2016-11-14 MED ORDER — PIPERACILLIN-TAZOBACTAM 3.375 G IVPB 30 MIN
3.3750 g | Freq: Once | INTRAVENOUS | Status: AC
  Administered 2016-11-14: 3.375 g via INTRAVENOUS
  Filled 2016-11-14 (×2): qty 50

## 2016-11-14 MED ORDER — SODIUM CHLORIDE 0.9 % IV SOLN
INTRAVENOUS | Status: AC
  Administered 2016-11-14: 16:00:00 via INTRAVENOUS

## 2016-11-14 MED ORDER — ATENOLOL 50 MG PO TABS: 50.0000 mg | ORAL_TABLET | Freq: Two times a day (BID) | ORAL | Status: DC

## 2016-11-14 NOTE — Progress Notes (Signed)
[facility] PROGRESS NOTE    Progress Note  [name] [name]  [id number] DOB: 1923-07-24 DOA: 11/12/2016 PCP: [name], MD     Brief Narrative:   [name] [name] is an 83 y.o. female past medical history of dementia, essential thrombocythemia, essential hypertension interstitial cystitis and a left bundle branch block which percent with syncope on the day of admission. There was an unwitnessed syncopal episode with the patient was found slumped over on her commode.  Assessment/Plan:  Syncope: 2-D echo done on 11/12/2016 showed an EF of 45% with grade 1 diastolic heart failure, aortic valve shows some sclerosis without stenosis. Normal motion abnormality. Cards consult for v-tach on telemetry who recommended conservative management and increase beta blockers.  Facial laceration: It was repair In the emergency room, CT scan of the brain and face atrophy of her brain with a small laceration.  Abdominal pain: Two-view abdominal x-ray showed possible ileus.  Acute instability: TSH less than 1, physical therapy evaluation is pending.    Essential thrombocythemia ([place]) Continue anagrelide. Continue aspirin.  Dementia:    Acute renal failure with acute renal cortical necrosis superimposed on stage 3 chronic kidney disease ([place]) With a baseline creatinine of 0.1-1.1, on admission of 1.4, this is likely 2 in the setting of volume depletion and ibuprofen start IV fluids recheck b-me tin am.  Fever: Check blood cultures, CXR, Place NPO, SLP, blood cultures, lactate and U/A. Start empiric antibiotics, use tylenol suppository.   DVT prophylaxis: lovenox Family Communication:grandson Disposition Plan/Barrier to D/C: unable to determine Code Status:     Code Status Orders        Start     Ordered   11/12/16 1232  Do not attempt resuscitation (DNR)  Continuous    Question Answer Comment  In the event of cardiac or respiratory ARREST Do not call a "code blue"   In the  event of cardiac or respiratory ARREST Do not perform Intubation, CPR, defibrillation or ACLS   In the event of cardiac or respiratory ARREST Use medication by any route, position, wound care, and other measures to relive pain and suffering. May use oxygen, suction and manual treatment of airway obstruction as needed for comfort.      11/12/16 1231    Code Status History    Date Active Date Inactive Code Status Order ID Comments User Context   This patient has a current code status but no historical code status.    Advance Directive Documentation   Flowsheet Row Most Recent Value  Type of Advance Directive  Out of facility DNR (pink MOST or yellow form), Living will, Healthcare Power of Attorney  Pre-existing out of facility DNR order (yellow form or pink MOST form)  Yellow form placed in chart (order not valid for inpatient use)  "MOST" Form in Place?  No data        IV Access:    Peripheral IV   Procedures and diagnostic studies:   Ct Head Wo Contrast  Result Date: 11/12/2016 CLINICAL DATA:  Syncopal episode with fall EXAM: CT HEAD WITHOUT CONTRAST CT MAXILLOFACIAL WITHOUT CONTRAST TECHNIQUE: Multidetector CT imaging of the head and maxillofacial structures were performed using the standard protocol without intravenous contrast. Multiplanar CT image reconstructions of the maxillofacial structures were also generated. COMPARISON:  Head CT January 08, 2016 FINDINGS: CT HEAD FINDINGS Brain: Moderate diffuse atrophy is stable. There is no intracranial mass, hemorrhage, extra-axial fluid collection, or midline shift. There is mild small vessel disease in the centra semiovale  bilaterally. Elsewhere gray-white compartments appear normal. No acute infarct evident. Vascular: There is no hyperdense vessel. There is calcification in each carotid siphon. Skull: The bony calvarium appears intact. There is a small benign enostosis arising in the right temporal region, stable. Other: Mastoid air cells  are clear. CT MAXILLOFACIAL FINDINGS Osseous: There is no evident fracture or dislocation. No blastic or lytic bone lesions are evident. 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 No intraorbital lesions are appreciable. Sinuses: Paranasal sinuses are clear. Ostiomeatal unit complexes are patent bilaterally. There is edema along the inferior aspect of the right nasal turbinate. No nares obstruction. There is minimal rightward deviation of the nasal septum. Soft tissues: There is moderate soft tissue swelling and mild soft tissue air over the left lower to mid face region. No soft tissue mass or abscess present. There is subcutaneous thickening in the area of trauma over the mid face on the left. Salivary glands appear symmetric bilaterally. No adenopathy evident. Tongue and tongue base regions are unremarkable. Visualize pharynx appears normal. There is extensive arthropathy in the visualized cervical spine. IMPRESSION: CT head: Stable moderate generalized atrophy with mild periventricular small vessel disease. No intracranial mass, hemorrhage, or extra-axial fluid collection. No evident acute infarct. Vascular calcification noted in the carotid siphon regions. CT maxillofacial: Soft tissue edema with likely subcutaneous hemorrhage throughout the left lower to mid face region. A small amount of soft tissue air is noted in this region. No acute fracture or dislocation. No intraorbital lesion. Paranasal sinuses are clear. Ostiomeatal unit complexes patent bilaterally. There is slight rightward deviation of the nasal septum. There is extensive arthropathy noted in the visualized cervical spine. 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  Electronically Signed   By: Lowella Grip III M.D.   On: 11/12/2016 16:14   Ct Maxillofacial Wo Contrast  Result Date: 11/12/2016 CLINICAL DATA:  Syncopal episode with fall EXAM: CT HEAD WITHOUT CONTRAST CT MAXILLOFACIAL WITHOUT CONTRAST TECHNIQUE: Multidetector CT imaging of the head and maxillofacial structures were performed using the standard protocol without intravenous contrast. Multiplanar CT image reconstructions of the maxillofacial structures were also generated. COMPARISON:  Head CT January 08, 2016 FINDINGS: CT HEAD FINDINGS Brain: Moderate diffuse atrophy is stable. There is no intracranial mass, hemorrhage, extra-axial fluid collection, or midline shift. There is mild small vessel disease in the centra semiovale bilaterally. Elsewhere gray-white compartments appear normal. No acute infarct evident. Vascular: There is no hyperdense vessel. There is calcification in each carotid siphon. Skull: The bony calvarium appears intact. There is a small benign enostosis arising in the right temporal region, stable. Other: Mastoid air cells are clear. CT MAXILLOFACIAL FINDINGS Osseous: There is no evident fracture or dislocation. No blastic or lytic bone lesions are evident. 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 No intraorbital lesions are appreciable. Sinuses: Paranasal sinuses are clear. Ostiomeatal unit complexes are patent bilaterally. There is edema along the inferior aspect of the right nasal turbinate. No nares obstruction. There is minimal rightward deviation of the nasal septum. Soft tissues: There is moderate soft tissue swelling and mild soft tissue air over the left lower to mid face region. No soft tissue mass or abscess  present. There is subcutaneous thickening in the area of trauma over the mid face on the left. Salivary glands  appear symmetric bilaterally. No adenopathy evident. Tongue and tongue base regions are unremarkable. Visualize pharynx appears normal. There is extensive arthropathy in the visualized cervical spine. IMPRESSION: CT head: Stable moderate generalized atrophy with mild periventricular small vessel disease. No intracranial mass, hemorrhage, or extra-axial fluid collection. No evident acute infarct. Vascular calcification noted in the carotid siphon regions. CT maxillofacial: Soft tissue edema with likely subcutaneous hemorrhage throughout the left lower to mid face region. A small amount of soft tissue air is noted in this region. No acute fracture or dislocation. No intraorbital lesion. Paranasal sinuses are clear. Ostiomeatal unit complexes patent bilaterally. There is slight rightward deviation of the nasal septum. There is extensive arthropathy noted in the visualized cervical spine. 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 Electronically Signed   By: Lowella Grip III M.D.   On: 11/12/2016 16:14     Medical Consultants:    None.  Anti-Infectives:   None  Subjective:    [name] [name] she is non verbal and hard to arouse.  Objective:    Vitals:   11/14/16 0202 11/14/16 0541 11/14/16 1428 11/14/16 1430  BP:  (!) 134/56 (!) 131/55   Pulse:  80 81   Resp: (!) 22 20 16    Temp:  98.9 F (37.2 C) (!) 100.4 F (38 C)   TempSrc:  Axillary Axillary   SpO2:  95% (!) 88% 90%  Weight:      Height:        Intake/Output Summary (Last 24 hours) at 11/14/16 1448 Last data filed at 11/14/16 1429  Gross per 24 hour  Intake           1012.5 ml  Output                0 ml  Net           1012.5 ml   Filed Weights   11/12/16 0603  Weight: 42.6 kg (94 lb)    Exam: General exam: In no acute distress. Respiratory system: Good air movement and clear to  auscultation. Cardiovascular system: S1 & S2 heard, RRR. No JVD. Gastrointestinal system: Abdomen is nondistended, soft and nontender.  Central nervous system: Alert and oriented. No focal neurological deficits. Extremities: No pedal edema. Skin: No rashes, lesions or ulcers Psychiatry: Judgement and insight appear normal. Mood & affect appropriate.    Data Reviewed:    Labs: Basic Metabolic Panel:  Recent Labs Lab 11/12/16 0813 11/13/16 0527 11/14/16 0353  NA 143 149* 142  K 3.5 3.4* 3.1*  CL 107 112* 108  CO2 24 24 26   GLUCOSE 112* 109* 113*  BUN 55* 45* 51*  CREATININE 1.45* 1.23* 1.69*  CALCIUM 7.8* 7.9* 7.7*  MG  --  2.3  --    GFR Estimated Creatinine Clearance: 14.6 mL/min (by C-G formula based on SCr of 1.69 mg/dL (H)). Liver Function Tests:  Recent Labs Lab 11/12/16 0813  AST 19  ALT 10*  ALKPHOS 57  BILITOT 0.6  PROT 6.9  ALBUMIN 3.8    Recent Labs Lab 11/12/16 0813  LIPASE 16   No results for input(s): AMMONIA in the last 168 hours. Coagulation profile  Recent Labs Lab 11/12/16 0813  INR 1.14    CBC:  Recent Labs Lab 11/12/16 0813  WBC 8.3  NEUTROABS 6.7  HGB 10.1*  HCT 32.7*  MCV 93.7  PLT 275   Cardiac Enzymes: No results for input(s): CKTOTAL, CKMB, CKMBINDEX, TROPONINI in the last 168 hours. BNP (last 3 results) No results for input(s): PROBNP in the last 8760 hours. CBG: No results for input(s): GLUCAP in the last 168 hours. D-Dimer: No results for input(s): DDIMER in the last 72 hours. Hgb A1c: No results for input(s): HGBA1C in the last 72 hours. Lipid Profile: No results for input(s): CHOL, HDL, LDLCALC, TRIG, CHOLHDL, LDLDIRECT in the last 72 hours. Thyroid function studies:  Recent Labs  11/12/16 1247  TSH 0.850   Anemia work up: No results for input(s): VITAMINB12, FOLATE, FERRITIN, TIBC, IRON, RETICCTPCT in the last 72 hours. Sepsis Labs:  Recent Labs Lab 11/12/16 0813  WBC 8.3    Microbiology Recent Results (from the past 240 hour(s))  MRSA PCR Screening     Status: None   Collection Time: 11/12/16 12:33 PM  Result Value Ref Range Status   MRSA by PCR NEGATIVE NEGATIVE Final    Comment:        The GeneXpert MRSA Assay (FDA approved for NASAL specimens only), is one component of a comprehensive MRSA colonization surveillance program. It is not intended to diagnose MRSA infection nor to guide or monitor treatment for MRSA infections.      Medications:   . anagrelide  3 mg Oral Daily  . calcitonin ([place])  1 spray Alternating Nares Daily  . calcium citrate-vitamin D  1 tablet Oral BID WC  . donepezil  10 mg Oral QHS  . dorzolamide-timolol  1 drop Both Eyes BID  . enoxaparin (LOVENOX) injection  20 mg Subcutaneous Q24H  . feeding supplement  240 mL Oral BID BM  . feeding supplement (ENSURE ENLIVE)  237 mL Oral TID BM  . latanoprost  1 drop Both Eyes QHS  . Magnesium  1 tablet Oral Daily  . memantine  5 mg Oral BID  . metoprolol tartrate  50 mg Oral BID  . mirtazapine  15 mg Oral QHS  . polyethylene glycol  17 g Oral Daily  . potassium chloride  40 mEq Oral BID  . sodium chloride flush  3 mL Intravenous Q12H  . vitamin E  400 Units Oral Daily   Continuous Infusions: . dextrose 50 mL/hr at 11/13/16 1213    Time spent: 25 min   LOS: 0 days   Charlynne Cousins  [facility] Pager [phone]  *Please refer to [email or web address], password [email or web address] to get updated schedule on who will round on this patient, as hospitalists switch teams weekly. If 7PM-7AM, please contact night-coverage at [email or web address], password [email or web address] for any overnight needs.  11/14/2016, 2:48 PM

## 2016-11-14 NOTE — Clinical Social Work Note (Signed)
CSW received call from [facility] that she received approval from her administrator to accept patient while awaiting PASARR number as patient will be in a "non-certified bed". CSW spoke with son Rush Landmark and arranged d/c and the patient's nurse called stating that patient has a low grade temperature and her 02 saturation is 88%- thus- MD has cancelled the d/c today. CSW notified Ms Hubbard Robinson and patient's son Rush Landmark.  Will ask CSW tomorrow to follow up to determine if d/c is indicated tomorrow. Facility will accept on a Sunday. Son verbalized understanding.  Lorie Phenix. Pauline Good, [place]  (weekend coverage)

## 2016-11-14 NOTE — Evaluation (Signed)
Clinical/Bedside Swallow Evaluation Patient Details  Name: [name] [name] MRN: [id number] Date of Birth: [birth year]  Today's Date: 11/14/2016 Time: SLP Start Time (ACUTE ONLY): [id number] SLP Stop Time (ACUTE ONLY): 1641 SLP Time Calculation (min) (ACUTE ONLY): 21 min  Past Medical History:  Past Medical History:  Diagnosis Date  . Abdominal pain, unspecified site 08/11/2012  . Anemia, unspecified 06/18/2011  . Arthritis   . BBB (bundle branch block) 03/03/2006  . Cardiac dysrhythmia, unspecified 03/03/2006  . Chronic interstitial cystitis 03/03/2006  . 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 Disorder of bone and cartilage, unspecified 04/20/2007  . Diverticulosis of colon (without mention of hemorrhage) 04/20/2007  . Essential thrombocythemia ([place]) 06/18/2011  . Glaucoma   . Hematuria, unspecified 04/22/2004  . Hemorrhage of gastrointestinal tract, unspecified 03/09/2007  . Herpes zoster without mention of complication [id number]  . Hypertension   . 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 Loss of weight 06/18/2011  . Lumbago 12/18/2010  . Memory loss 12/19/2009  . Neck fracture ([place])   . Osteoarthrosis, unspecified whether generalized or localized, unspecified site 03/03/2006  . Other and unspecified hyperlipidemia 09/01/2006  . Sciatica 06/19/2010   Past Surgical History:  Past Surgical History:  Procedure Laterality Date  . ABDOMINAL HYSTERECTOMY    . APPENDECTOMY    . BREAST CYST EXCISION Bilateral    benign  . CATARACT EXTRACTION W/ INTRAOCULAR LENS  IMPLANT, BILATERAL    . TONSILLECTOMY     HPI:  83 y.o. female with medical history of dementia, essential thrombocythemia, hypertension, interstitial cystitis, and LBBB presented with a syncopal episode earlier on the day of admission. On 11/11/2016, the patient had an unwitnessed syncopal episode. She was found slumped over her commode. The patient was moved from assisted living to a skilled nursing facility level of care at [facility].  Unfortunately, around 4:30 on the morning of admission, the patient once again was found on the commode slumped over. Apparently, the patient was conscious at the time but was found to have a laceration on the left side of her face. Due to the patient's history of cognitive impairment, she is unable to provide any significant history. The patient cannot recall any of her recent events. However she denies headache, visual disturbance, chest discomfort, short of breath, vomiting, diarrhea, abdominal pain, dysuria. The patient's grandson at the bedside supplements the history. He states that the only recent medication change was an increase in her Remeron dose over the past month. He also states that the patient struggles with constipation and has had poor oral intake. He states that she has had a history of falls, but has not fallen in over one year. She normally ambulatory with a walker. The patient's grandson states that the patient's mental status is at baseline; CXR on  11/14/16 indicated Left lower lobe pneumonia  Assessment / Plan / Recommendation Clinical Impression   Pt with oral holding with puree consistency and delayed cough with larger volumes of thin liquids; delayed cough was eliminated with smaller sips of thin via cup; pt was lethargic and required moderate verbal cues during BSE; recommend Dysphagia 1 (puree)/thin via small sips and utilizing aspiration precautions only if alert; NPO unless mentation changes at this time; ST will con't to f/u while in house for diet tolerance and education re: safety with swallowing with caregivers/family.    Aspiration Risk  Mild aspiration risk;Moderate aspiration risk    Diet Recommendation   Dysphagia 1 (puree)/thin via small sips (when alert); NPO unless mentation changes  Medication Administration: Crushed with puree    Other  Recommendations Oral Care Recommendations: Oral care BID   Follow up Recommendations Skilled Nursing facility       Frequency and Duration min 2x/week  1 week       Prognosis Prognosis for Safe Diet Advancement: Good Barriers to Reach Goals: Cognitive deficits      Swallow Study   General Date of Onset: 11/12/16 HPI: 83 y.o. female with medical history of dementia, essential thrombocythemia, hypertension, interstitial cystitis, and LBBB presented with a syncopal episode earlier on the day of admission. On 11/11/2016, the patient had an unwitnessed syncopal episode. She was found slumped over her commode. The patient was moved from assisted living to a skilled nursing facility level of care at [facility]. Unfortunately, around 4:30 on the morning of admission, the patient once again was found on the commode slumped over. Apparently, the patient was conscious at the time but was found to have a laceration on the left side of her face. Due to the patient's history of cognitive impairment, she is unable to provide any significant history. The patient cannot recall any of her recent events. However she denies headache, visual disturbance, chest discomfort, short of breath, vomiting, diarrhea, abdominal pain, dysuria. The patient's grandson at the bedside supplements the history. He states that the only recent medication change was an increase in her Remeron dose over the past month. He also states that the patient struggles with constipation and has had poor oral intake. He states that she has had a history of falls, but has not fallen in over one year. She normally ambulatory with a walker. The patient's grandson states that the patient's mental status is at baseline. Type of Study: Bedside Swallow Evaluation Diet Prior to this Study: NPO Temperature Spikes Noted: Yes Respiratory Status: Nasal cannula History of Recent Intubation: No Behavior/Cognition: Confused;Lethargic/Drowsy;Requires cueing Oral Cavity Assessment: Other (comment) (DTA) Oral Care Completed by SLP: Recent completion by staff Oral Cavity -  Dentition: Adequate natural dentition Self-Feeding Abilities: Able to feed self;Needs assist Patient Positioning: Upright in bed Baseline Vocal Quality: Low vocal intensity Volitional Cough: Weak Volitional Swallow: Unable to elicit    Oral/Motor/Sensory Function Overall Oral Motor/Sensory Function: Generalized oral weakness   Ice Chips Ice chips: Not tested   Thin Liquid Thin Liquid: Impaired Presentation: Cup;Straw Oral Phase Functional Implications: Oral holding Pharyngeal  Phase Impairments: Suspected delayed Swallow;Cough - Delayed (only with larger volumes of liquids)    Nectar Thick Nectar Thick Liquid: Not tested   Honey Thick Honey Thick Liquid: Not tested   Puree Puree: Impaired Presentation: Spoon Oral Phase Functional Implications: Prolonged oral transit;Oral holding Pharyngeal Phase Impairments: Suspected delayed Swallow   Solid      Solid: Not tested    Functional Assessment Tool Used:  (NOMS) Functional Limitations: Swallowing Swallow Current Status [id number]): At least 20 percent but less than 40 percent impaired, limited or restricted Swallow Goal Status [phone]): At least 20 percent but less than 40 percent impaired, limited or restricted   ADAMS,PAT, M.S., CCC-SLP 11/14/2016,5:46 PM

## 2016-11-14 NOTE — Clinical Social Work Note (Signed)
Per MD- patient is stable for d/c today.  SW services initiated a PASARR request yesterday which is under "manual review" with nursing by [facility].  Unable to place patient in SNF until this number is obtained.  It is very doubtful that this number could be received this weekend as [facility] is closed- however- SW services will continue to check the website in care the number comes up. SW spoke with Yates Decamp- admissions at [facility]. She stated that patient was moved to the SNF unit the the day before she came to the hospital.They requested a PASARR number but then cancelled it when patient was admitted to the hospital.   Discussed above with patient's son Rush Landmark who will meet with Ms Hubbard Robinson this afternoon.  He verbalized understanding but disappointment in this situation. CSW services will continue to monitor for PASARR number.  Lorie Phenix. Pauline Good, [place]  (weekend coverage)

## 2016-11-14 NOTE — Discharge Summary (Deleted)
Physician Discharge Summary  [name] [name] [id number] DOB: [birth year] DOA: 11/12/2016  PCP: [name], MD  Admit date: 11/12/2016 Discharge date: 11/14/2016  [facility]  [facility]  Recommendations for Outpatient Follow-up:  1. Follow up with PCP in 1 weeks 2. Please obtain BMP/CBC in one week. 3. Palliative care to me with family and facility to discuss end-of-life.   Home Health:no Equipment/Devices:none  Discharge Condition:Guarded CODE STATUS:DNR Diet recommendation: Regular  Brief/Interim Summary: Per Dr. Carles Collet: 83 y.o. female with medical history of dementia, essential thrombocythemia, hypertension, interstitial cystitis, and LBBB presented with a syncopal episode earlier on the day of admission. On 11/11/2016, the patient had an unwitnessed syncopal episode. She was found slumped over her commode. The patient was moved from assisted living to a skilled nursing facility level of care at [facility]. Unfortunately, around 4:30 on the morning of admission, the patient once again was found on the commode slumped over. Apparently, the patient was conscious at the time but was found to have a laceration on the left side of her face. Due to the patient's history of cognitive impairment, she is unable to provide any significant history. The patient cannot recall any of her recent events. However she denies headache, visual disturbance, chest discomfort, short of breath, vomiting, diarrhea, abdominal pain, dysuria  Discharge Diagnoses:  Active Problems:   Memory loss   Essential thrombocythemia ([place])   Dementia   Syncope   Acute renal failure with acute renal cortical necrosis superimposed on stage 3 chronic kidney disease (HCC)   Acute renal failure superimposed on stage 3 chronic kidney disease (HCC)   Face lacerations   Protein-calorie malnutrition, severe  Syncope: 2-D echo done on 11/12/2016 showed an EF of 45% with grade 1 diastolic  heart failure, aortic valve shows some sclerosis without stenosis. Normal motion abnormality. Fecal occult blood was negative, TSH was 0.8 EKG is EKG is unchanged from previous normal sinus rhythm left bundle branch block nonspecific T-wave changes She had an episode of V. tach overnight. Cardiology was consulted recommended to increase her beta blocker, due to to her low GFR she was changed to metoprolol as atenolol is cleared renally. She will have a basic metabolic panel in 1 week.  Facial laceration: It was repair In the emergency room, CT scan of the brain and face atrophy of her brain with a small laceration.  Abdominal pain: Now resolved,  She was having regular bowel movements.  Acute instability: TSH less than 1, physical therapy evaluation recommended skilled nursing facility.    Essential thrombocythemia ([place]) Continue anagrelide. Continue aspirin.  Dementia:    Acute renal failure with acute renal cortical necrosis superimposed on stage 3 chronic kidney disease ([place]) With a baseline creatinine of 1.1, on admission of 1.4, this is likely 2 in the setting of volume depletion, due to decreased oral intake and ibuprofen. Her creatinine improved with IV fluid hydration. Avoid NSAIDs as an outpatient. She will need a basic metabolic panel next week.    Discharge Instructions  Discharge Instructions    Diet - low sodium heart healthy    Complete by:  As directed    Increase activity slowly    Complete by:  As directed      Allergies as of 11/14/2016      Reactions   Darvocet [propoxyphene N-acetaminophen] Nausea And Vomiting   Darvon Nausea And Vomiting   Meperidine And Related Nausea And Vomiting   Vicodin [hydrocodone-acetaminophen] Nausea And Vomiting  Demerol [meperidine]       Medication List    STOP taking these medications   atenolol 25 MG tablet Commonly known as:  TENORMIN     TAKE these medications   acetaminophen 325 MG tablet Commonly known  as:  TYLENOL Take 650 mg by mouth every 4 (four) hours as needed for mild pain, fever or headache.   anagrelide 1 MG capsule Commonly known as:  AGRYLIN Take 3 mg by mouth daily.   aspirin 81 MG tablet Take 81 mg by mouth daily.   calcitonin (salmon) 200 UNIT/ACT nasal spray Commonly known as:  MIACALCIN/FORTICAL Place 1 spray into alternate nostrils daily.   CALCIUM 600/VITAMIN D 600-400 MG-UNIT Tabs Generic drug:  Calcium Carbonate-Vitamin D3 Take 1 tablet by mouth 2 (two) times daily with a meal.   donepezil 10 MG tablet Commonly known as:  ARICEPT Take one tablet at bedtime for memory   dorzolamide-timolol 22.3-6.8 MG/ML ophthalmic solution Commonly known as:  COSOPT Place 1 drop into both eyes 2 (two) times daily. 2% - 0.5%.   feeding supplement Liqd Take 90 mLs by mouth 2 (two) times daily between meals.   flurbiprofen 100 MG tablet Commonly known as:  ANSAID TAKE 1 TABLET BY MOUTH EVERY DAY   latanoprost 0.005 % ophthalmic solution Commonly known as:  XALATAN Place 1 drop into both eyes at bedtime.   LORazepam 1 MG tablet Commonly known as:  ATIVAN Take 0.25 mg by mouth as needed for sleep.   Magnesium 250 MG Tabs Take 1 tablet by mouth daily.   memantine 10 MG tablet Commonly known as:  NAMENDA One twice daily to help preserve memory   metoprolol 50 MG tablet Commonly known as:  LOPRESSOR Take 1 tablet (50 mg total) by mouth 2 (two) times daily.   mirtazapine 15 MG tablet Commonly known as:  REMERON Take 30 mg by mouth at bedtime.   polyethylene glycol packet Commonly known as:  MIRALAX / GLYCOLAX Take 17 g by mouth daily.   URELLE 81 MG Tabs tablet Take 1 tablet by mouth at bedtime as needed.   vitamin E 400 UNIT capsule Take 400 Units by mouth daily.       Allergies  Allergen Reactions  . Darvocet [Propoxyphene N-Acetaminophen] Nausea And Vomiting  . Darvon Nausea And Vomiting  . Meperidine And Related Nausea And Vomiting  . Vicodin  [Hydrocodone-Acetaminophen] Nausea And Vomiting  . Demerol [Meperidine]     Consultations:  None   Procedures/Studies: Ct Head Wo Contrast  Result Date: 11/12/2016 CLINICAL DATA:  Syncopal episode with fall EXAM: CT HEAD WITHOUT CONTRAST CT MAXILLOFACIAL WITHOUT CONTRAST TECHNIQUE: Multidetector CT imaging of the head and maxillofacial structures were performed using the standard protocol without intravenous contrast. Multiplanar CT image reconstructions of the maxillofacial structures were also generated. COMPARISON:  Head CT January 08, 2016 FINDINGS: CT HEAD FINDINGS Brain: Moderate diffuse atrophy is stable. There is no intracranial mass, hemorrhage, extra-axial fluid collection, or midline shift. There is mild small vessel disease in the centra semiovale bilaterally. Elsewhere gray-white compartments appear normal. No acute infarct evident. Vascular: There is no hyperdense vessel. There is calcification in each carotid siphon. Skull: The bony calvarium appears intact. There is a small benign enostosis arising in the right temporal region, stable. Other: Mastoid air cells are clear. CT MAXILLOFACIAL FINDINGS Osseous: There is no evident fracture or dislocation. No blastic or lytic bone lesions are evident. There is extensive arthropathy in both temporomandibular joint regions with erosion and  apparent avascular necrosis of the left mandibular common bile Orbits: Orbits appear symmetric and normal bilaterally. No intraorbital lesions are appreciable. Sinuses: Paranasal sinuses are clear. Ostiomeatal unit complexes are patent bilaterally. There is edema along the inferior aspect of the right nasal turbinate. No nares obstruction. There is minimal rightward deviation of the nasal septum. Soft tissues: There is moderate soft tissue swelling and mild soft tissue air over the left lower to mid face region. No soft tissue mass or abscess present. There is subcutaneous thickening in the area of trauma over the  mid face on the left. Salivary glands appear symmetric bilaterally. No adenopathy evident. Tongue and tongue base regions are unremarkable. Visualize pharynx appears normal. There is extensive arthropathy in the visualized cervical spine. IMPRESSION: CT head: Stable moderate generalized atrophy with mild periventricular small vessel disease. No intracranial mass, hemorrhage, or extra-axial fluid collection. No evident acute infarct. Vascular calcification noted in the carotid siphon regions. CT maxillofacial: Soft tissue edema with likely subcutaneous hemorrhage throughout the left lower to mid face region. A small amount of soft tissue air is noted in this region. No acute fracture or dislocation. No intraorbital lesion. Paranasal sinuses are clear. Ostiomeatal unit complexes patent bilaterally. There is slight rightward deviation of the nasal septum. There is extensive arthropathy noted in the visualized cervical spine. 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 Electronically Signed   By: Lowella Grip III M.D.   On: 11/12/2016 16:14   Dg Abd 2 Views  Result Date: 11/12/2016 CLINICAL DATA:  Chronic constipation EXAM: ABDOMEN - 2 VIEW COMPARISON:  None. FINDINGS: Scattered large and small bowel gas is noted. Mild small bowel dilatation is seen. No free air is noted. No acute bony abnormality is seen. Degenerative change of the lumbar spine is noted. IMPRESSION: Mild small bowel dilatation is seen. Correlation with physical exam is recommended. This may represent a partial small bowel obstruction or small-bowel ileus. Electronically Signed   By: Inez Catalina M.D.   On: 11/12/2016 14:46   Ct Maxillofacial Wo Contrast  Result Date: 11/12/2016 CLINICAL DATA:  Syncopal episode with fall EXAM: CT HEAD WITHOUT CONTRAST CT MAXILLOFACIAL WITHOUT CONTRAST TECHNIQUE: Multidetector CT imaging of the head  and maxillofacial structures were performed using the standard protocol without intravenous contrast. Multiplanar CT image reconstructions of the maxillofacial structures were also generated. COMPARISON:  Head CT January 08, 2016 FINDINGS: CT HEAD FINDINGS Brain: Moderate diffuse atrophy is stable. There is no intracranial mass, hemorrhage, extra-axial fluid collection, or midline shift. There is mild small vessel disease in the centra semiovale bilaterally. Elsewhere gray-white compartments appear normal. No acute infarct evident. Vascular: There is no hyperdense vessel. There is calcification in each carotid siphon. Skull: The bony calvarium appears intact. There is a small benign enostosis arising in the right temporal region, stable. Other: Mastoid air cells are clear. CT MAXILLOFACIAL FINDINGS Osseous: There is no evident fracture or dislocation. No blastic or lytic bone lesions are evident. 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 No intraorbital lesions are appreciable. Sinuses: Paranasal sinuses are clear. Ostiomeatal unit complexes are patent bilaterally. There is edema along the inferior aspect of the right nasal turbinate. No nares obstruction. There is minimal rightward deviation of the nasal septum. Soft tissues: There is moderate soft tissue swelling and mild soft  tissue air over the left lower to mid face region. No soft tissue mass or abscess present. There is subcutaneous thickening in the area of trauma over the mid face on the left. Salivary glands appear symmetric bilaterally. No adenopathy evident. Tongue and tongue base regions are unremarkable. Visualize pharynx appears normal. There is extensive arthropathy in the visualized cervical spine. IMPRESSION: CT head: Stable moderate generalized atrophy with mild periventricular small  vessel disease. No intracranial mass, hemorrhage, or extra-axial fluid collection. No evident acute infarct. Vascular calcification noted in the carotid siphon regions. CT maxillofacial: Soft tissue edema with likely subcutaneous hemorrhage throughout the left lower to mid face region. A small amount of soft tissue air is noted in this region. No acute fracture or dislocation. No intraorbital lesion. Paranasal sinuses are clear. Ostiomeatal unit complexes patent bilaterally. There is slight rightward deviation of the nasal septum. There is extensive arthropathy noted in the visualized cervical spine. 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 Electronically Signed   By: Lowella Grip III M.D.   On: 11/12/2016 16:14      Subjective: No complains  Discharge Exam: Vitals:   11/14/16 0202 11/14/16 0541  BP:  (!) 134/56  Pulse:  80  Resp: (!) 22 20  Temp:  98.9 F (37.2 C)   Vitals:   11/13/16 1530 11/13/16 2201 11/14/16 0202 11/14/16 0541  BP: (!) 116/47 (!) 128/52  (!) 134/56  Pulse: 88 81  80  Resp: 20 (!) 29 (!) 22 20  Temp: 100.1 F (37.8 C) 99.2 F (37.3 C)  98.9 F (37.2 C)  TempSrc: Oral Oral  Axillary  SpO2: 92% 95%  95%  Weight:      Height:        General: Pt is alert, awake, not in acute distress Cardiovascular: RRR, S1/S2 +, no rubs, no gallops Respiratory: CTA bilaterally, no wheezing, no rhonchi Abdominal: Soft, NT, ND, bowel sounds + Extremities: no edema, no cyanosis    The results of significant diagnostics from this hospitalization (including imaging, microbiology, ancillary and laboratory) are listed below for reference.     Microbiology: Recent Results (from the past 240 hour(s))  MRSA PCR Screening     Status: None   Collection Time: 11/12/16 12:33 PM  Result Value Ref Range Status   MRSA by PCR NEGATIVE NEGATIVE Final    Comment:         The GeneXpert MRSA Assay (FDA approved for NASAL specimens only), is one component of a comprehensive MRSA colonization surveillance program. It is not intended to diagnose MRSA infection nor to guide or monitor treatment for MRSA infections.      Labs: BNP (last 3 results) No results for input(s): BNP in the last 8760 hours. Basic Metabolic Panel:  Recent Labs Lab 11/12/16 0813 11/13/16 0527 11/14/16 0353  NA 143 149* 142  K 3.5 3.4* 3.1*  CL 107 112* 108  CO2 24 24 26   GLUCOSE 112* 109* 113*  BUN 55* 45* 51*  CREATININE 1.45* 1.23* 1.69*  CALCIUM 7.8* 7.9* 7.7*  MG  --  2.3  --    Liver Function Tests:  Recent Labs Lab 11/12/16 0813  AST 19  ALT 10*  ALKPHOS 57  BILITOT 0.6  PROT 6.9  ALBUMIN 3.8    Recent Labs Lab 11/12/16 0813  LIPASE 16   No results for input(s): AMMONIA in the last 168 hours. CBC:  Recent  Labs Lab 11/12/16 0813  WBC 8.3  NEUTROABS 6.7  HGB 10.1*  HCT 32.7*  MCV 93.7  PLT 275   Cardiac Enzymes: No results for input(s): CKTOTAL, CKMB, CKMBINDEX, TROPONINI in the last 168 hours. BNP: Invalid input(s): POCBNP CBG: No results for input(s): GLUCAP in the last 168 hours. D-Dimer No results for input(s): DDIMER in the last 72 hours. Hgb A1c No results for input(s): HGBA1C in the last 72 hours. Lipid Profile No results for input(s): CHOL, HDL, LDLCALC, TRIG, CHOLHDL, LDLDIRECT in the last 72 hours. Thyroid function studies  Recent Labs  11/12/16 1247  TSH 0.850   Anemia work up No results for input(s): VITAMINB12, FOLATE, FERRITIN, TIBC, IRON, RETICCTPCT in the last 72 hours. Urinalysis    Component Value Date/Time   COLORURINE GREEN (A) 12/12/2015 1907   APPEARANCEUR CLOUDY (A) 12/12/2015 1907   LABSPEC 1.020 12/12/2015 1907   LABSPEC 1.010 06/26/2011 1500   PHURINE 5.5 12/12/2015 1907   GLUCOSEU NEGATIVE 12/12/2015 1907   HGBUR LARGE (A) 12/12/2015 1907   BILIRUBINUR NEGATIVE 12/12/2015 1907   BILIRUBINUR  Negative 06/26/2011 1500   KETONESUR NEGATIVE 12/12/2015 1907   PROTEINUR 100 (A) 12/12/2015 1907   NITRITE NEGATIVE 12/12/2015 1907   LEUKOCYTESUR MODERATE (A) 12/12/2015 1907   LEUKOCYTESUR Small 06/26/2011 1500   Sepsis Labs Invalid input(s): PROCALCITONIN,  WBC,  LACTICIDVEN Microbiology Recent Results (from the past 240 hour(s))  MRSA PCR Screening     Status: None   Collection Time: 11/12/16 12:33 PM  Result Value Ref Range Status   MRSA by PCR NEGATIVE NEGATIVE Final    Comment:        The GeneXpert MRSA Assay (FDA approved for NASAL specimens only), is one component of a comprehensive MRSA colonization surveillance program. It is not intended to diagnose MRSA infection nor to guide or monitor treatment for MRSA infections.      Time coordinating discharge: Over 30 minutes  SIGNED:   Charlynne Cousins, MD  [facility] 11/14/2016, 10:45 AM Pager   If 7PM-7AM, please contact night-coverage [email or web address] Password [email or web address]

## 2016-11-14 NOTE — Progress Notes (Signed)
Pharmacy Antibiotic Note  [name] [name] is a 83 y.o. female admitted on 11/12/2016 with syncope. PMH dementia, essential thrombocythemia, essential HTN, interstitial cystitis, and LBBB. Patient was ready for discharge today but experienced new fevers and Pharmacy consulted for vancomycin/Zosyn dosing for aspiration PNA/HCAP. AKI noted.   Plan:  Vancomycin 750 mg IV now, then 500 mg IV q48 hr; goal trough 15-20 mcg/mL (Note MRSA PCR negative but > 48 hrs prior to presentation of fevers, which meets criteria for HCAP)  Measure vancomycin trough levels at steady state as indicated  Zosyn 2.25 g IV q6 hr    Height: 5\' 1"  (154.9 cm) Weight: 94 lb (42.6 kg) IBW/kg (Calculated) : 47.8  Temp (24hrs), Avg:99.5 F (37.5 C), Min:98.9 F (37.2 C), Max:100.4 F (38 C)   Recent Labs Lab 11/12/16 0813 11/13/16 0527 11/14/16 0353 11/14/16 1519  WBC 8.3  --   --  10.9*  CREATININE 1.45* 1.23* 1.69*  --   LATICACIDVEN  --   --   --  1.3    Estimated Creatinine Clearance: 14.6 mL/min (by C-G formula based on SCr of 1.69 mg/dL (H)).    Allergies  Allergen Reactions  . Darvocet [Propoxyphene N-Acetaminophen] Nausea And Vomiting  . Darvon Nausea And Vomiting  . Meperidine And Related Nausea And Vomiting  . Vicodin [Hydrocodone-Acetaminophen] Nausea And Vomiting  . Demerol [Meperidine]     Antimicrobials this admission: Vanc 1/6 >>  Zosyn 1/6 >>   Dose adjustments this admission: ---  Microbiology results: 1/6 BCx: sent 1/6 UCx: sent  1/4 MRSA PCR: negative  Thank you for allowing pharmacy to be a part of this patient's care.  Reuel Boom, PharmD, BCPS Pager: [phone] 11/14/2016, 6:09 PM

## 2016-11-15 DIAGNOSIS — N179 Acute kidney failure, unspecified: Secondary | ICD-10-CM

## 2016-11-15 LAB — CBC
HEMATOCRIT: 28.2 % — AB (ref 36.0–46.0)
Hemoglobin: 8.6 g/dL — ABNORMAL LOW (ref 12.0–15.0)
MCH: 29 pg (ref 26.0–34.0)
MCHC: 30.5 g/dL (ref 30.0–36.0)
MCV: 94.9 fL (ref 78.0–100.0)
Platelets: 204 10*3/uL (ref 150–400)
RBC: 2.97 MIL/uL — ABNORMAL LOW (ref 3.87–5.11)
RDW: 18.7 % — ABNORMAL HIGH (ref 11.5–15.5)
WBC: 9.5 10*3/uL (ref 4.0–10.5)

## 2016-11-15 LAB — BASIC METABOLIC PANEL
Anion gap: 8 (ref 5–15)
BUN: 52 mg/dL — ABNORMAL HIGH (ref 6–20)
CALCIUM: 7.6 mg/dL — AB (ref 8.9–10.3)
CO2: 24 mmol/L (ref 22–32)
CREATININE: 1.5 mg/dL — AB (ref 0.44–1.00)
Chloride: 111 mmol/L (ref 101–111)
GFR calc Af Amer: 34 mL/min — ABNORMAL LOW (ref >60)
GFR calc non Af Amer: 29 mL/min — ABNORMAL LOW (ref >60)
GLUCOSE: 96 mg/dL (ref 65–99)
Potassium: 4 mmol/L (ref 3.5–5.1)
Sodium: 143 mmol/L (ref 135–145)

## 2016-11-15 MED ORDER — DEXTROSE 5 % IV SOLN
500.0000 mg | INTRAVENOUS | Status: DC
  Administered 2016-11-15: 500 mg via INTRAVENOUS
  Filled 2016-11-15 (×2): qty 500

## 2016-11-15 MED ORDER — DEXTROSE 5 % IV SOLN
1.0000 g | INTRAVENOUS | Status: DC
  Administered 2016-11-15 – 2016-11-16 (×2): 1 g via INTRAVENOUS
  Filled 2016-11-15 (×2): qty 10

## 2016-11-15 MED ORDER — SODIUM CHLORIDE 0.9 % IV BOLUS (SEPSIS)
500.0000 mL | Freq: Once | INTRAVENOUS | Status: AC
  Administered 2016-11-15: 500 mL via INTRAVENOUS

## 2016-11-15 NOTE — Progress Notes (Signed)
[facility] PROGRESS NOTE    Progress Note  [name] [name]  [id number] DOB: Sep 29, 1923 DOA: 11/12/2016 PCP: [name], MD     Brief Narrative:   [name] [name] is an 83 y.o. female past medical history of dementia, essential thrombocythemia, essential hypertension interstitial cystitis and a left bundle branch block which percent with syncope on the day of admission. There was an unwitnessed syncopal episode with the patient was found slumped over on her commode.  Assessment/Plan:  Syncope due to CAP: Cards consult for v-tach on telemetry who recommended conservative management and increase beta blockers. 48 hours after admission the patient became encephalopathic, she started having fevers with mild leukocytosis. X-ray was done that showed a left lower lobe pneumonia, she was started empirically on IV vancomycin and Zosyn now D escalated to IV Rocephin and azithromycin. She was placed nothing by mouth, swallowing evaluation is pending, leukocytosis has resolved and she has become afebrile. U/A does not appear to be infected.  Acute encephalopathy:  Likely due to community-acquired pneumonia. Mildly improved today compared to yesterday.  Facial laceration: It was repair In the emergency room, CT scan of the brain and face atrophy of her brain with a small laceration.  Abdominal pain: Two-view abdominal x-ray showed possible ileus. Patient is not complaining of any further abdominal pain having bowel movements and passing gas.  Acute instability: She will go to skilled nursing facility    Essential thrombocythemia [facility]) Continue anagrelide. Continue aspirin.  Dementia:    Acute renal failure with acute renal cortical necrosis superimposed on stage 3 chronic kidney disease ([place]) With a baseline creatinine of 0.1-1.1, peaked at 1.6, she was started on aggressive IV fluid hydration is slowly improving.  Repeat basic metabolic panel in the morning.   DVT  prophylaxis: lovenox Family Communication:grandson and Son Disposition Plan/Barrier to D/C: unable to determine Code Status:     Code Status Orders        Start     Ordered   11/12/16 1232  Do not attempt resuscitation (DNR)  Continuous    Question Answer Comment  In the event of cardiac or respiratory ARREST Do not call a "code blue"   In the event of cardiac or respiratory ARREST Do not perform Intubation, CPR, defibrillation or ACLS   In the event of cardiac or respiratory ARREST Use medication by any route, position, wound care, and other measures to relive pain and suffering. May use oxygen, suction and manual treatment of airway obstruction as needed for comfort.      11/12/16 1231    Code Status History    Date Active Date Inactive Code Status Order ID Comments User Context   This patient has a current code status but no historical code status.    Advance Directive Documentation   Flowsheet Row Most Recent Value  Type of Advance Directive  Out of facility DNR (pink MOST or yellow form), Living will, Healthcare Power of Attorney  Pre-existing out of facility DNR order (yellow form or pink MOST form)  Yellow form placed in chart (order not valid for inpatient use)  "MOST" Form in Place?  No data        IV Access:    Peripheral IV   Procedures and diagnostic studies:   Dg Chest 1 View  Result Date: 11/14/2016 CLINICAL DATA:  Fevers EXAM: CHEST 1 VIEW COMPARISON:  None. FINDINGS: Cardiac shadow is mildly enlarged in size. The thoracic aorta is tortuous. Diffuse left basilar infiltrate is seen. Mild right  basilar atelectasis is noted. No bony abnormality is seen. IMPRESSION: Left lower lobe pneumonia. Electronically Signed   By: Inez Catalina M.D.   On: 11/14/2016 15:35     Medical Consultants:    None.  Anti-Infectives:   None  Subjective:    [name] [name] to arouse her today she relates she has no new complaints.  Objective:    Vitals:   11/14/16  1428 11/14/16 1430 11/14/16 2146 11/15/16 0703  BP: (!) 131/55  (!) 121/46 (!) 129/45  Pulse: 81  63 64  Resp: 16  (!) 25 (!) 24  Temp: (!) 100.4 F (38 C)  98.2 F (36.8 C) 100 F (37.8 C)  TempSrc: Axillary  Oral Axillary  SpO2: (!) 88% 90% 98% 100%  Weight:      Height:        Intake/Output Summary (Last 24 hours) at 11/15/16 0818 Last data filed at 11/15/16 0630  Gross per 24 hour  Intake          1411.25 ml  Output              500 ml  Net           911.25 ml   Filed Weights   11/12/16 0603  Weight: 42.6 kg (94 lb)    Exam: General exam: In no acute distress. Respiratory system: Good air movement and clear to auscultation. Cardiovascular system: S1 & S2 heard, RRR. No JVD. Gastrointestinal system: Abdomen is nondistended, soft and nontender.  Central nervous system: Alert and oriented. No focal neurological deficits. Extremities: No pedal edema. Skin: No rashes, lesions or ulcers Psychiatry: Judgement and insight appear normal. Mood & affect appropriate.    Data Reviewed:    Labs: Basic Metabolic Panel:  Recent Labs Lab 11/12/16 0813 11/13/16 0527 11/14/16 0353 11/15/16 0537  NA 143 149* 142 143  K 3.5 3.4* 3.1* 4.0  CL 107 112* 108 111  CO2 24 24 26 24   GLUCOSE 112* 109* 113* 96  BUN 55* 45* 51* 52*  CREATININE 1.45* 1.23* 1.69* 1.50*  CALCIUM 7.8* 7.9* 7.7* 7.6*  MG  --  2.3  --   --    GFR Estimated Creatinine Clearance: 16.4 mL/min (by C-G formula based on SCr of 1.5 mg/dL (H)). Liver Function Tests:  Recent Labs Lab 11/12/16 0813  AST 19  ALT 10*  ALKPHOS 57  BILITOT 0.6  PROT 6.9  ALBUMIN 3.8    Recent Labs Lab 11/12/16 0813  LIPASE 16   No results for input(s): AMMONIA in the last 168 hours. Coagulation profile  Recent Labs Lab 11/12/16 0813  INR 1.14    CBC:  Recent Labs Lab 11/12/16 0813 11/14/16 1519 11/15/16 0537  WBC 8.3 10.9* 9.5  NEUTROABS 6.7  --   --   HGB 10.1* 9.3* 8.6*  HCT 32.7* 30.2* 28.2*    MCV 93.7 95.6 94.9  PLT 275 210 204   Cardiac Enzymes: No results for input(s): CKTOTAL, CKMB, CKMBINDEX, TROPONINI in the last 168 hours. BNP (last 3 results) No results for input(s): PROBNP in the last 8760 hours. CBG: No results for input(s): GLUCAP in the last 168 hours. D-Dimer: No results for input(s): DDIMER in the last 72 hours. Hgb A1c: No results for input(s): HGBA1C in the last 72 hours. Lipid Profile: No results for input(s): CHOL, HDL, LDLCALC, TRIG, CHOLHDL, LDLDIRECT in the last 72 hours. Thyroid function studies:  Recent Labs  11/12/16 1247  TSH 0.850   Anemia work  up: No results for input(s): VITAMINB12, FOLATE, FERRITIN, TIBC, IRON, RETICCTPCT in the last 72 hours. Sepsis Labs:  Recent Labs Lab 11/12/16 0813 11/14/16 1519 11/15/16 0537  WBC 8.3 10.9* 9.5  LATICACIDVEN  --  1.3  --    Microbiology Recent Results (from the past 240 hour(s))  MRSA PCR Screening     Status: None   Collection Time: 11/12/16 12:33 PM  Result Value Ref Range Status   MRSA by PCR NEGATIVE NEGATIVE Final    Comment:        The GeneXpert MRSA Assay (FDA approved for NASAL specimens only), is one component of a comprehensive MRSA colonization surveillance program. It is not intended to diagnose MRSA infection nor to guide or monitor treatment for MRSA infections.   Culture, blood (Routine X 2) w Reflex to ID Panel     Status: None (Preliminary result)   Collection Time: 11/14/16  3:19 PM  Result Value Ref Range Status   Specimen Description BLOOD RIGHT HAND  Final   Special Requests IN PEDIATRIC BOTTLE 2CC  Final   Culture PENDING  Incomplete   Report Status PENDING  Incomplete  Culture, blood (Routine X 2) w Reflex to ID Panel     Status: None (Preliminary result)   Collection Time: 11/14/16  3:24 PM  Result Value Ref Range Status   Specimen Description BLOOD RIGHT ANTECUBITAL  Final   Special Requests IN PEDIATRIC BOTTLE 2CC  Final   Culture PENDING   Incomplete   Report Status PENDING  Incomplete     Medications:   . anagrelide  3 mg Oral Daily  . calcitonin (salmon)  1 spray Alternating Nares Daily  . calcium citrate-vitamin D  1 tablet Oral BID WC  . donepezil  10 mg Oral QHS  . dorzolamide-timolol  1 drop Both Eyes BID  . enoxaparin (LOVENOX) injection  20 mg Subcutaneous Q24H  . feeding supplement  240 mL Oral BID BM  . feeding supplement (ENSURE ENLIVE)  237 mL Oral TID BM  . latanoprost  1 drop Both Eyes QHS  . Magnesium  1 tablet Oral Daily  . memantine  5 mg Oral BID  . metoprolol tartrate  50 mg Oral BID  . mirtazapine  15 mg Oral QHS  . piperacillin-tazobactam (ZOSYN)  IV  2.25 g Intravenous Q6H  . polyethylene glycol  17 g Oral Daily  . sodium chloride flush  3 mL Intravenous Q12H  . [START ON 11/16/2016] vancomycin  500 mg Intravenous Q48H  . vitamin E  400 Units Oral Daily   Continuous Infusions: . sodium chloride 75 mL/hr at 11/14/16 1549    Time spent: 25 min   LOS: 1 day   Charlynne Cousins  [facility] Pager [phone]  *Please refer to [email or web address], password [email or web address] to get updated schedule on who will round on this patient, as hospitalists switch teams weekly. If 7PM-7AM, please contact night-coverage at [email or web address], password [email or web address] for any overnight needs.  11/15/2016, 8:18 AM

## 2016-11-16 DIAGNOSIS — N189 Chronic kidney disease, unspecified: Secondary | ICD-10-CM

## 2016-11-16 DIAGNOSIS — J189 Pneumonia, unspecified organism: Principal | ICD-10-CM

## 2016-11-16 DIAGNOSIS — X58XXXA Exposure to other specified factors, initial encounter: Secondary | ICD-10-CM

## 2016-11-16 DIAGNOSIS — G9340 Encephalopathy, unspecified: Secondary | ICD-10-CM

## 2016-11-16 DIAGNOSIS — F0390 Unspecified dementia without behavioral disturbance: Secondary | ICD-10-CM

## 2016-11-16 DIAGNOSIS — D631 Anemia in chronic kidney disease: Secondary | ICD-10-CM

## 2016-11-16 DIAGNOSIS — D638 Anemia in other chronic diseases classified elsewhere: Secondary | ICD-10-CM

## 2016-11-16 LAB — CBC
HEMATOCRIT: 28.9 % — AB (ref 36.0–46.0)
HEMOGLOBIN: 8.8 g/dL — AB (ref 12.0–15.0)
MCH: 29.8 pg (ref 26.0–34.0)
MCHC: 30.4 g/dL (ref 30.0–36.0)
MCV: 98 fL (ref 78.0–100.0)
Platelets: 209 10*3/uL (ref 150–400)
RBC: 2.95 MIL/uL — AB (ref 3.87–5.11)
RDW: 19.3 % — ABNORMAL HIGH (ref 11.5–15.5)
WBC: 11.7 10*3/uL — ABNORMAL HIGH (ref 4.0–10.5)

## 2016-11-16 LAB — BASIC METABOLIC PANEL
Anion gap: 10 (ref 5–15)
BUN: 43 mg/dL — ABNORMAL HIGH (ref 6–20)
CHLORIDE: 118 mmol/L — AB (ref 101–111)
CO2: 22 mmol/L (ref 22–32)
Calcium: 7.6 mg/dL — ABNORMAL LOW (ref 8.9–10.3)
Creatinine, Ser: 1.1 mg/dL — ABNORMAL HIGH (ref 0.44–1.00)
GFR calc non Af Amer: 43 mL/min — ABNORMAL LOW (ref >60)
GFR, EST AFRICAN AMERICAN: 49 mL/min — AB (ref >60)
Glucose, Bld: 91 mg/dL (ref 65–99)
Potassium: 3.6 mmol/L (ref 3.5–5.1)
Sodium: 150 mmol/L — ABNORMAL HIGH (ref 135–145)

## 2016-11-16 MED ORDER — AZITHROMYCIN 200 MG/5ML PO SUSR
500.0000 mg | Freq: Every day | ORAL | Status: DC
  Administered 2016-11-16 – 2016-11-17 (×2): 500 mg via ORAL
  Filled 2016-11-16 (×3): qty 12.5

## 2016-11-16 MED ORDER — FREE WATER
200.0000 mL | Freq: Three times a day (TID) | Status: DC
  Administered 2016-11-16 – 2016-11-17 (×3): 200 mL

## 2016-11-16 MED ORDER — DEXTROSE 5 % IV SOLN
INTRAVENOUS | Status: DC
  Administered 2016-11-16: 20:00:00 via INTRAVENOUS

## 2016-11-16 MED ORDER — AMOXICILLIN-POT CLAVULANATE 250-62.5 MG/5ML PO SUSR
500.0000 mg | Freq: Two times a day (BID) | ORAL | Status: DC
  Administered 2016-11-16 – 2016-11-17 (×3): 500 mg via ORAL
  Filled 2016-11-16 (×3): qty 10

## 2016-11-16 MED ORDER — DARBEPOETIN ALFA 150 MCG/0.3ML IJ SOSY
150.0000 ug | PREFILLED_SYRINGE | Freq: Once | INTRAMUSCULAR | Status: AC
  Administered 2016-11-17: 150 ug via SUBCUTANEOUS
  Filled 2016-11-16 (×2): qty 0.3

## 2016-11-16 NOTE — Progress Notes (Signed)
[facility] PROGRESS NOTE    Progress Note  [name] [name]  [id number] DOB: 02-Sep-1923 DOA: 11/12/2016 PCP: [name], MD     Brief Narrative:   [name] [name] is an 83 y.o. female past medical history of dementia, essential thrombocythemia, essential hypertension interstitial cystitis and a left bundle branch block which percent with syncope on the day of admission. There was an unwitnessed syncopal episode with the patient was found slumped over on her commode.  Assessment/Plan:  Syncope due to CAP: 48 hours after admission the patient became encephalopathic, she started having fevers with mild leukocytosis. X-ray was done that showed a left lower lobe pneumonia. Cont. IV empiric antibiotics In oral form to: Also for aspiration pneumonia. Repeat SLP, leukocytosis has resolved and she has become afebrile. Repeat Swallowing evaluation.  Acute encephalopathy:  Likely due to community-acquired pneumonia. Significantly improved today.  Facial laceration: It was repair In the emergency room, CT scan of the brain and face atrophy of her brain with a small laceration.  Abdominal pain: Patient is not complaining of any further abdominal pain having bowel movements and passing gas.  Acute instability: She will go to skilled nursing facility    Essential thrombocythemia [facility]) Continue anagrelide. Continue aspirin.  Dementia:    Acute renal failure with acute renal cortical necrosis superimposed on stage 3 chronic kidney disease ([place]) With a baseline creatinine of 0.1-1.1, peaked at 1.6, she was started on aggressive IV fluid hydration is slowly improving.  Repeat basic metabolic panel in the morning.   DVT prophylaxis: lovenox Family Communication:grandson and Son Disposition Plan/Barrier to D/C:Hopefully in am Code Status:     Code Status Orders        Start     Ordered   11/12/16 1232  Do not attempt resuscitation (DNR)  Continuous    Question Answer  Comment  In the event of cardiac or respiratory ARREST Do not call a "code blue"   In the event of cardiac or respiratory ARREST Do not perform Intubation, CPR, defibrillation or ACLS   In the event of cardiac or respiratory ARREST Use medication by any route, position, wound care, and other measures to relive pain and suffering. May use oxygen, suction and manual treatment of airway obstruction as needed for comfort.      11/12/16 1231    Code Status History    Date Active Date Inactive Code Status Order ID Comments User Context   This patient has a current code status but no historical code status.    Advance Directive Documentation   Flowsheet Row Most Recent Value  Type of Advance Directive  Out of facility DNR (pink MOST or yellow form), Living will, Healthcare Power of Attorney  Pre-existing out of facility DNR order (yellow form or pink MOST form)  Yellow form placed in chart (order not valid for inpatient use)  "MOST" Form in Place?  No data        IV Access:    Peripheral IV   Procedures and diagnostic studies:   Dg Chest 1 View  Result Date: 11/14/2016 CLINICAL DATA:  Fevers EXAM: CHEST 1 VIEW COMPARISON:  None. FINDINGS: Cardiac shadow is mildly enlarged in size. The thoracic aorta is tortuous. Diffuse left basilar infiltrate is seen. Mild right basilar atelectasis is noted. No bony abnormality is seen. IMPRESSION: Left lower lobe pneumonia. Electronically Signed   By: Inez Catalina M.D.   On: 11/14/2016 15:35     Medical Consultants:    None.  Anti-Infectives:  None  Subjective:    [name] [name] patient is currently on a conversation today feels much better. She is hungry.  Objective:    Vitals:   11/15/16 1000 11/15/16 1300 11/15/16 2132 11/16/16 0636  BP:  (!) 134/57 (!) 145/57 (!) 153/60  Pulse: 84 68 76 66  Resp:  (!) 22 (!) 26 (!) 22  Temp:  98.1 F (36.7 C) 99.8 F (37.7 C) 97.6 F (36.4 C)  TempSrc:  Oral Oral Oral  SpO2:  97% 95% 93%    Weight:      Height:        Intake/Output Summary (Last 24 hours) at 11/16/16 0920 Last data filed at 11/15/16 2200  Gross per 24 hour  Intake              300 ml  Output                0 ml  Net              300 ml   Filed Weights   11/12/16 0603  Weight: 42.6 kg (94 lb)    Exam: General exam: In no acute distress. Respiratory system: Good air movement and clear to auscultation. Cardiovascular system: S1 & S2 heard, RRR. No JVD. Gastrointestinal system: Abdomen is nondistended, soft and nontender.  Central nervous system: Alert and oriented. No focal neurological deficits. Extremities: No pedal edema. Skin: No rashes, lesions or ulcers Psychiatry: Judgement and insight appear normal. Mood & affect appropriate.    Data Reviewed:    Labs: Basic Metabolic Panel:  Recent Labs Lab 11/12/16 0813 11/13/16 0527 11/14/16 0353 11/15/16 0537 11/16/16 0515  NA 143 149* 142 143 150*  K 3.5 3.4* 3.1* 4.0 3.6  CL 107 112* 108 111 118*  CO2 24 24 26 24 22   GLUCOSE 112* 109* 113* 96 91  BUN 55* 45* 51* 52* 43*  CREATININE 1.45* 1.23* 1.69* 1.50* 1.10*  CALCIUM 7.8* 7.9* 7.7* 7.6* 7.6*  MG  --  2.3  --   --   --    GFR Estimated Creatinine Clearance: 22.4 mL/min (by C-G formula based on SCr of 1.1 mg/dL (H)). Liver Function Tests:  Recent Labs Lab 11/12/16 0813  AST 19  ALT 10*  ALKPHOS 57  BILITOT 0.6  PROT 6.9  ALBUMIN 3.8    Recent Labs Lab 11/12/16 0813  LIPASE 16   No results for input(s): AMMONIA in the last 168 hours. Coagulation profile  Recent Labs Lab 11/12/16 0813  INR 1.14    CBC:  Recent Labs Lab 11/12/16 0813 11/14/16 1519 11/15/16 0537 11/16/16 0515  WBC 8.3 10.9* 9.5 11.7*  NEUTROABS 6.7  --   --   --   HGB 10.1* 9.3* 8.6* 8.8*  HCT 32.7* 30.2* 28.2* 28.9*  MCV 93.7 95.6 94.9 98.0  PLT 275 210 204 209   Cardiac Enzymes: No results for input(s): CKTOTAL, CKMB, CKMBINDEX, TROPONINI in the last 168 hours. BNP (last 3  results) No results for input(s): PROBNP in the last 8760 hours. CBG: No results for input(s): GLUCAP in the last 168 hours. D-Dimer: No results for input(s): DDIMER in the last 72 hours. Hgb A1c: No results for input(s): HGBA1C in the last 72 hours. Lipid Profile: No results for input(s): CHOL, HDL, LDLCALC, TRIG, CHOLHDL, LDLDIRECT in the last 72 hours. Thyroid function studies: No results for input(s): TSH, T4TOTAL, T3FREE, THYROIDAB in the last 72 hours.  Invalid input(s): FREET3 Anemia work up: No results  for input(s): VITAMINB12, FOLATE, FERRITIN, TIBC, IRON, RETICCTPCT in the last 72 hours. Sepsis Labs:  Recent Labs Lab 11/12/16 0813 11/14/16 1519 11/15/16 0537 11/16/16 0515  WBC 8.3 10.9* 9.5 11.7*  LATICACIDVEN  --  1.3  --   --    Microbiology Recent Results (from the past 240 hour(s))  MRSA PCR Screening     Status: None   Collection Time: 11/12/16 12:33 PM  Result Value Ref Range Status   MRSA by PCR NEGATIVE NEGATIVE Final    Comment:        The GeneXpert MRSA Assay (FDA approved for NASAL specimens only), is one component of a comprehensive MRSA colonization surveillance program. It is not intended to diagnose MRSA infection nor to guide or monitor treatment for MRSA infections.   Culture, blood (Routine X 2) w Reflex to ID Panel     Status: None (Preliminary result)   Collection Time: 11/14/16  3:19 PM  Result Value Ref Range Status   Specimen Description BLOOD RIGHT HAND  Final   Special Requests IN PEDIATRIC BOTTLE 2CC  Final   Culture   Final    NO GROWTH < 24 HOURS Performed at [facility]    Report Status PENDING  Incomplete  Culture, blood (Routine X 2) w Reflex to ID Panel     Status: None (Preliminary result)   Collection Time: 11/14/16  3:24 PM  Result Value Ref Range Status   Specimen Description BLOOD RIGHT ANTECUBITAL  Final   Special Requests IN PEDIATRIC BOTTLE 2CC  Final   Culture   Final    NO GROWTH < 24  HOURS Performed at [facility]    Report Status PENDING  Incomplete     Medications:   . anagrelide  3 mg Oral Daily  . azithromycin  500 mg Intravenous Q24H  . calcitonin (salmon)  1 spray Alternating Nares Daily  . calcium citrate-vitamin D  1 tablet Oral BID WC  . cefTRIAXone (ROCEPHIN)  IV  1 g Intravenous Q24H  . donepezil  10 mg Oral QHS  . dorzolamide-timolol  1 drop Both Eyes BID  . enoxaparin (LOVENOX) injection  20 mg Subcutaneous Q24H  . feeding supplement  240 mL Oral BID BM  . feeding supplement (ENSURE ENLIVE)  237 mL Oral TID BM  . latanoprost  1 drop Both Eyes QHS  . Magnesium  1 tablet Oral Daily  . memantine  5 mg Oral BID  . metoprolol tartrate  50 mg Oral BID  . mirtazapine  15 mg Oral QHS  . polyethylene glycol  17 g Oral Daily  . sodium chloride flush  3 mL Intravenous Q12H  . vitamin E  400 Units Oral Daily   Continuous Infusions:   Time spent: 25 min   LOS: 2 days   Charlynne Cousins  [facility] Pager [phone]  *Please refer to [email or web address], password [email or web address] to get updated schedule on who will round on this patient, as hospitalists switch teams weekly. If 7PM-7AM, please contact night-coverage at [email or web address], password [email or web address] for any overnight needs.  11/16/2016, 9:20 AM

## 2016-11-16 NOTE — Progress Notes (Signed)
Speech Language Pathology Treatment: Dysphagia  Patient Details Name: [name] [name] MRN: [id number] DOB: [birth year] Today's Date: 11/16/2016 Time: [id number] SLP Time Calculation (min) (ACUTE ONLY): 35 min  Assessment / Plan / Recommendation Clinical Impression  Pt seen for functional meal observation.  Son present *Rush [place]* and was helping his mother to eat upon SlP entrance to room.  RN reports pt coughing some on thin liquids.    Pt with baseline cough observed and cough associated with po intake.   Cough is weak and not productive despite pt best efforts to clear.   Drinking liquids that were thicker did not eliminate symptoms and tolerance appeared equal.  Oral residuals noted with eggs with poor pt awareness requiring cues to swish and expectorate with water after consumption of puree not effective to clear. Of note, eggs were day and not adequately "pureed" consistencies.  Advised son to order extra gravies/sauces with meal to aid oral clearance.  Educated pt and son to findings/recommendations using written and verbal cues with mod assist required.   Pt able to follow directions with delayed processing and her voice was strong and clear.  Suspect mental status issues/lethargy impacted her airway protection with po.  Recommend continue CREAMY puree/thin diet with strict precautions.    Pt denies any issues with reflux = cough noted after intake of meal and son reports cough when pt lays down.  ? Due to secretions, change in position mobilizing secretions? And/or possible reflux/esophageal issues.  Recommend esophageal precautions given pt's advanced age.  Will follow up for tolerance, readiness for dietary advancement, etc.  Thanks.Marland Kitchen     HPI HPI: 83 y.o. female with medical history of dementia, essential thrombocythemia, hypertension, interstitial cystitis, and LBBB presented with a syncopal episode earlier on the day of admission. On 11/11/2016, the patient had an unwitnessed syncopal episode.  She was found slumped over her commode. The patient was moved from assisted living to a skilled nursing facility level of care at [facility]. Unfortunately, around 4:30 on the morning of admission, the patient once again was found on the commode slumped over. Apparently, the patient was conscious at the time but was found to have a laceration on the left side of her face. Due to the patient's history of cognitive impairment, she is unable to provide any significant history. The patient cannot recall any of her recent events. However she denies headache, visual disturbance, chest discomfort, short of breath, vomiting, diarrhea, abdominal pain, dysuria. The patient's grandson at the bedside supplements the history. He states that the only recent medication change was an increase in her Remeron dose over the past month. He also states that the patient struggles with constipation and has had poor oral intake. He states that she has had a history of falls, but has not fallen in over one year. She normally ambulatory with a walker. The patient's grandson states that the patient's mental status is at baseline.      SLP Plan  Continue with current plan of care     Recommendations  Diet recommendations: Dysphagia 1 (puree);Thin liquid Liquids provided via: Cup;No straw Medication Administration: Crushed with puree Supervision: Staff to assist with self feeding;Full supervision/cueing for compensatory strategies Compensations: Slow rate;Small sips/bites;Other (Comment) (have pt rinse and expectorate with water after meal) Postural Changes and/or Swallow Maneuvers: Seated upright 90 degrees;Upright 30-60 min after meal                Oral Care Recommendations:  (oral care after EVERY meal) Follow up  Recommendations: Skilled Nursing facility Plan: Continue with current plan of care       [place], Ragan [facility] SLP [place], [place] 11/16/2016, 1:58 PM

## 2016-11-16 NOTE — Progress Notes (Signed)
[name] [name]   DOB:1923-04-03   MC#:[id number]   MOQ#:[id number]  Hematology follow up  Subjective: I was informed that pt was admitted to [facility] on 11/13/2015 for syncope, she has been under my care for ET and anemia. She had laceration on left left side head required multiple stiches. I met her granddaughter at her bedside, and she states pt has been quite confused since her admission. She may be discharged back to the skilled nursing facility at the [facility] tomorrow.    Objective:  Vitals:   11/16/16 0636 11/16/16 1520  BP: (!) 153/60 (!) 153/60  Pulse: 66 67  Resp: (!) 22 17  Temp: 97.6 F (36.4 C) 98.5 F (36.9 C)    Body mass index is 17.76 kg/m.  Intake/Output Summary (Last 24 hours) at 11/16/16 1902 Last data filed at 11/16/16 1800  Gross per 24 hour  Intake                0 ml  Output                2 ml  Net               -2 ml     Sclerae unicteric  Oropharynx clear  No peripheral adenopathy  Lungs clear -- no rales or rhonchi  Heart regular rate and rhythm  Abdomen benign  MSK no focal spinal tenderness, no peripheral edema  Neuro nonfocal  Skin: (+) Multiple stitches and take Moses at the upper outer of her left eye  CBG (last 3)  No results for input(s): GLUCAP in the last 72 hours.   Labs:  Lab Results  Component Value Date   WBC 11.7 (H) 11/16/2016   HGB 8.8 (L) 11/16/2016   HCT 28.9 (L) 11/16/2016   MCV 98.0 11/16/2016   PLT 209 11/16/2016   NEUTROABS 6.7 11/12/2016   CMP Latest Ref Rng & Units 11/16/2016 11/15/2016 11/14/2016  Glucose 65 - 99 mg/dL 91 96 113(H)  BUN 6 - 20 mg/dL 43(H) 52(H) 51(H)  Creatinine 0.44 - 1.00 mg/dL 1.10(H) 1.50(H) 1.69(H)  Sodium 135 - 145 mmol/L 150(H) 143 142  Potassium 3.5 - 5.1 mmol/L 3.6 4.0 3.1(L)  Chloride 101 - 111 mmol/L 118(H) 111 108  CO2 22 - 32 mmol/L _0 Calcium 8.9 - 10.3 mg/dL 7.6(L) 7.6(L) 7.7(L)  Total Protein 6.5 - 8.1 g/dL - - -  Total Bilirubin 0.3 - 1.2 mg/dL - - -  Alkaline Phos 38 - 126  U/L - - -  AST 15 - 41 U/L - - -  ALT 14 - 54 U/L - - -     Urine Studies No results for input(s): UHGB, CRYS in the last 72 hours.  Invalid input(s): UACOL, UAPR, USPG, UPH, UTP, UGL, UKET, UBIL, UNIT, UROB, [place], UEPI, UWBC, [place], [place]  Basic Metabolic Panel:  Recent Labs Lab 11/12/16 0813 11/13/16 0527 11/14/16 0353 11/15/16 0537 11/16/16 0515  NA 143 149* 142 143 150*  K 3.5 3.4* 3.1* 4.0 3.6  CL 107 112* 108 111 118*  CO2 _1 GLUCOSE 112* 109* 113* 96 91  BUN 55* 45* 51* 52* 43*  CREATININE 1.45* 1.23* 1.69* 1.50* 1.10*  CALCIUM 7.8* 7.9* 7.7* 7.6* 7.6*  MG  --  2.3  --   --   --    GFR Estimated Creatinine Clearance: 22.4 mL/min (by C-G formula based on SCr of 1.1 mg/dL (H)). Liver  Function Tests:  Recent Labs Lab 11/12/16 0813  AST 19  ALT 10*  ALKPHOS 57  BILITOT 0.6  PROT 6.9  ALBUMIN 3.8    Recent Labs Lab 11/12/16 0813  LIPASE 16   No results for input(s): AMMONIA in the last 168 hours. Coagulation profile  Recent Labs Lab 11/12/16 0813  INR 1.14    CBC:  Recent Labs Lab 11/12/16 0813 11/14/16 1519 11/15/16 0537 11/16/16 0515  WBC 8.3 10.9* 9.5 11.7*  NEUTROABS 6.7  --   --   --   HGB 10.1* 9.3* 8.6* 8.8*  HCT 32.7* 30.2* 28.2* 28.9*  MCV 93.7 95.6 94.9 98.0  PLT 275 210 204 209   Cardiac Enzymes: No results for input(s): CKTOTAL, CKMB, CKMBINDEX, TROPONINI in the last 168 hours. BNP: Invalid input(s): POCBNP CBG: No results for input(s): GLUCAP in the last 168 hours. D-Dimer No results for input(s): DDIMER in the last 72 hours. Hgb A1c No results for input(s): HGBA1C in the last 72 hours. Lipid Profile No results for input(s): CHOL, HDL, LDLCALC, TRIG, CHOLHDL, LDLDIRECT in the last 72 hours. Thyroid function studies No results for input(s): TSH, T4TOTAL, T3FREE, THYROIDAB in the last 72 hours.  Invalid input(s): FREET3 Anemia work up No results for input(s): VITAMINB12, FOLATE,  FERRITIN, TIBC, IRON, RETICCTPCT in the last 72 hours. Microbiology Recent Results (from the past 240 hour(s))  MRSA PCR Screening     Status: None   Collection Time: 11/12/16 12:33 PM  Result Value Ref Range Status   MRSA by PCR NEGATIVE NEGATIVE Final    Comment:        The GeneXpert MRSA Assay (FDA approved for NASAL specimens only), is one component of a comprehensive MRSA colonization surveillance program. It is not intended to diagnose MRSA infection nor to guide or monitor treatment for MRSA infections.   Urine culture     Status: Abnormal (Preliminary result)   Collection Time: 11/12/16  6:45 PM  Result Value Ref Range Status   Specimen Description URINE, CLEAN CATCH  Final   Special Requests NONE  Final   Culture 80,000 COLONIES/mL ESCHERICHIA COLI (A)  Final   Report Status PENDING  Incomplete  Culture, blood (Routine X 2) w Reflex to ID Panel     Status: None (Preliminary result)   Collection Time: 11/14/16  3:19 PM  Result Value Ref Range Status   Specimen Description BLOOD RIGHT HAND  Final   Special Requests IN PEDIATRIC BOTTLE 2CC  Final   Culture   Final    NO GROWTH 2 DAYS Performed at [facility]    Report Status PENDING  Incomplete  Culture, blood (Routine X 2) w Reflex to ID Panel     Status: None (Preliminary result)   Collection Time: 11/14/16  3:24 PM  Result Value Ref Range Status   Specimen Description BLOOD RIGHT ANTECUBITAL  Final   Special Requests IN PEDIATRIC BOTTLE 2CC  Final   Culture   Final    NO GROWTH 2 DAYS Performed at [facility]    Report Status PENDING  Incomplete      Studies:  No results found.  Assessment: 83 y.o. admitted for syncope   1. Syncope secondary to infection 2. Community-acquired pneumonia, left lower lobe 3. Acute encephalopathy, secondary to her infection and dementia 4. Facial laceration 5. Essential thrombocythemia 6. Anemia of chronic disease, on Aranesp 7. Dementia   Plan: -Pt  is on broad antibiotics for her pneumonia, overall improved -Her  plt has been normal, continue anagrelide -She has been on aranesp injection every 4 weeks for her anemia, is due this week, I'll order 150 mcg to be given in the morning, so she does not have to come back to my office right after discharge  -I will see her back in 4 weeks, will schedule her f/u.    Truitt Merle, MD 11/16/2016  7:02 PM

## 2016-11-16 NOTE — Clinical Social Work Note (Signed)
[place] MUST PASARR obtained: [id number] Rozell Searing, MSW [phone] 11/16/2016 10:59 AM

## 2016-11-16 NOTE — Clinical Social Work Note (Signed)
Per MD, patient is not medically stable for discharge at this time.   Patient has bed at [facility] once medically stable for discharge.   MSW remains available as needed.   Glendon Axe, MSW [phone] 11/16/2016 11:55 AM

## 2016-11-16 NOTE — Progress Notes (Signed)
Taking over care of patient agree with previous RN assessment. Patient resting comfortably at this time. Will continue to monitor.

## 2016-11-16 NOTE — Progress Notes (Signed)
Physical Therapy Treatment Patient Details Name: [name] [name] MRN: [id number] DOB: [birth year] Today's Date: 11-27-16    History of Present Illness 83 y.o. female past medical history of dementia, essential thrombocythemia, essential hypertension interstitial cystitis and a left bundle branch block and admitted with unwitnessed syncopal episode with facial laceration    PT Comments    Pt assisted with mobility and requiring at least mod assist at this time.  Continue to recommend SNF upon d/c.   Follow Up Recommendations  SNF;Supervision/Assistance - 24 hour     Equipment Recommendations  None recommended by PT    Recommendations for Other Services       Precautions / Restrictions Precautions Precautions: Fall    Mobility  Bed Mobility Overal bed mobility: Needs Assistance Bed Mobility: Sit to Supine       Sit to supine: +2 for physical assistance;Mod assist   General bed mobility comments: multimodal cues for technique, pt initiating mobility however requiring assist  Transfers Overall transfer level: Needs assistance Equipment used: 2 person hand held assist Transfers: Sit to/from Stand;Stand Pivot Transfers Sit to Stand: Min assist;+2 physical assistance;+2 safety/equipment Stand pivot transfers: Mod assist;+2 physical assistance;+2 safety/equipment       General transfer comment: pt up on BSC on arrival, pt took a steps in a circle and then assisted back to bed, pt requiring increased multimodal cues for technique and task  Ambulation/Gait                 Stairs            Wheelchair Mobility    Modified Rankin (Stroke Patients Only)       Balance           Standing balance support: Bilateral upper extremity supported;During functional activity Standing balance-Leahy Scale: Zero                      Cognition Arousal/Alertness: Awake/alert Behavior During Therapy: WFL for tasks assessed/performed Overall Cognitive  Status: Within Functional Limits for tasks assessed                      Exercises      General Comments        Pertinent Vitals/Pain Pain Assessment: Faces Faces Pain Scale: No hurt    Home Living                      Prior Function            PT Goals (current goals can now be found in the care plan section) Progress towards PT goals: Progressing toward goals    Frequency    Min 3X/week      PT Plan Current plan remains appropriate    Co-evaluation             End of Session Equipment Utilized During Treatment: Gait belt Activity Tolerance: Patient limited by fatigue Patient left: in bed;with call bell/phone within reach;with bed alarm set     Time: 1345-1358 PT Time Calculation (min) (ACUTE ONLY): 13 min  Charges:  $Therapeutic Activity: 8-22 mins                    G Codes:      Sebastien Jackson,KATHrine E 2016/11/27, 2:36 PM  Carmelia Bake, PT, DPT 2016/11/27 Pager: [phone]

## 2016-11-17 DIAGNOSIS — J189 Pneumonia, unspecified organism: Secondary | ICD-10-CM

## 2016-11-17 DIAGNOSIS — J181 Lobar pneumonia, unspecified organism: Secondary | ICD-10-CM

## 2016-11-17 DIAGNOSIS — D638 Anemia in other chronic diseases classified elsewhere: Secondary | ICD-10-CM

## 2016-11-17 LAB — URINE CULTURE

## 2016-11-17 LAB — BASIC METABOLIC PANEL
ANION GAP: 8 (ref 5–15)
BUN: 40 mg/dL — ABNORMAL HIGH (ref 6–20)
CHLORIDE: 120 mmol/L — AB (ref 101–111)
CO2: 22 mmol/L (ref 22–32)
CREATININE: 0.92 mg/dL (ref 0.44–1.00)
Calcium: 7.9 mg/dL — ABNORMAL LOW (ref 8.9–10.3)
GFR calc non Af Amer: 53 mL/min — ABNORMAL LOW (ref >60)
Glucose, Bld: 134 mg/dL — ABNORMAL HIGH (ref 65–99)
POTASSIUM: 3 mmol/L — AB (ref 3.5–5.1)
SODIUM: 150 mmol/L — AB (ref 135–145)

## 2016-11-17 MED ORDER — AMOXICILLIN-POT CLAVULANATE 250-62.5 MG/5ML PO SUSR: 500.0000 mg | Freq: Two times a day (BID) | ORAL | 0 refills | Status: AC

## 2016-11-17 MED ORDER — POTASSIUM CHLORIDE CRYS ER 20 MEQ PO TBCR
40.0000 meq | EXTENDED_RELEASE_TABLET | ORAL | Status: AC
  Administered 2016-11-17 (×2): 40 meq via ORAL
  Filled 2016-11-17 (×2): qty 2

## 2016-11-17 MED ORDER — FREE WATER: 300.0000 mL | Status: DC

## 2016-11-17 MED ORDER — AZITHROMYCIN 200 MG/5ML PO SUSR: 500.0000 mg | Freq: Every day | ORAL | 0 refills | Status: AC

## 2016-11-17 NOTE — Clinical Social Work Note (Signed)
Medical Social Worker facilitated patient discharge including contacting patient family and facility to confirm patient discharge plans.  Clinical information faxed to facility and family agreeable with plan.  MSW arranged ambulance transport via [place] to [facility].  RN to call report prior to discharge.  Medical Social Worker will sign off for now as social work intervention is no longer needed. Please consult [place] again if new need arises.  Glendon Axe, MSW [phone] 11/17/2016 1:08 PM

## 2016-11-17 NOTE — Progress Notes (Signed)
Report called to Verdis Frederickson[facility].  All questioned.    Pt will be transported by [facility].  Packet in pts chart.

## 2016-11-17 NOTE — Discharge Summary (Signed)
Physician Discharge Summary  Amishi Dusseault [id number] DOB: 10-25-1923 DOA: 11/12/2016  PCP: [name], MD  Admit date: 11/12/2016 Discharge date: 11/17/2016  [facility]  [facility]  Recommendations for Outpatient Follow-up:  1. Follow up with PCP in 1 weeks 2. Please obtain BMP To follow-up on basic metabolic panel. 3. Palliative care to me with family and facility to discuss end-of-life.   Home Health:no Equipment/Devices:none  Discharge Condition:Guarded CODE STATUS:DNR Diet recommendation: Regular  Brief/Interim Summary: Per Dr. Carles Collet: 83 y.o. female with medical history of dementia, essential thrombocythemia, hypertension, interstitial cystitis, and LBBB presented with a syncopal episode earlier on the day of admission. On 11/11/2016, the patient had an unwitnessed syncopal episode. She was found slumped over her commode. The patient was moved from assisted living to a skilled nursing facility level of care at [facility] Unfortunately, around 4:30 on the morning of admission, the patient once again was found on the commode slumped over. Apparently, the patient was conscious at the time but was found to have a laceration on the left side of her face. Due to the patient's history of cognitive impairment, she is unable to provide any significant history. The patient cannot recall any of her recent events. However she denies headache, visual disturbance, chest discomfort, short of breath, vomiting, diarrhea, abdominal pain, dysuria  Discharge Diagnoses:  Active Problems:   Memory loss   Essential thrombocythemia ([place])   Dementia   Syncope   Acute renal failure with acute renal cortical necrosis superimposed on stage 3 chronic kidney disease (HCC)   Acute renal failure superimposed on stage 3 chronic kidney disease (HCC)   Face lacerations   Protein-calorie malnutrition, severe   AKI (acute kidney injury) ([place])   Anemia of chronic  disease  Syncope due to CAP: 2-D echo done on 11/12/2016 showed an EF of 45% with grade 1 diastolic heart failure, aortic valve shows some sclerosis without stenosis. Normal motion abnormality. Fecal occult blood was negative, TSH was 0.8 EKG is EKG is unchanged from previous normal sinus rhythm left bundle branch block nonspecific T-wave changes She had an episode of V. tach overnight. Cardiology was consulted recommended to increase her beta blocker, due to to her low GFR she was changed to metoprolol as atenolol is cleared renally. 48 hours after admission the patient became encephalopathic, she started having fevers with mild leukocytosis. X-ray was done that showed a left lower lobe pneumonia. She started on empiric IV Rocephin and azithromycin she defervesced her mentation improved so she was changed to oral Augmentin and azithromycin. Excellent swelling evaluation was done recommended a dysphagia 2 diet.  Acute encephalopathy:  Likely due to community-acquired pneumonia. Now resolved.  Facial laceration: It was repair In the emergency room, CT scan of the brain and face atrophy of her brain with a small laceration.  Abdominal pain: Now resolved,  She was having regular bowel movements.  Acute instability: TSH less than 1, physical therapy evaluation recommended skilled nursing facility.    Essential thrombocythemia ([place]) Continue anagrelide. Continue aspirin.  Dementia:    Acute renal failure with acute renal cortical necrosis superimposed on stage 3 chronic kidney disease ([place]) With a baseline creatinine of 1.1, on admission of 1.4, this is likely 2 in the setting of volume depletion, due to decreased oral intake and ibuprofen. Her creatinine improved with IV fluid hydration.    Discharge Instructions  Discharge Instructions    Diet - low sodium heart healthy    Complete  by:  As directed    Diet - low sodium heart healthy    Complete by:  As directed    Increase  activity slowly    Complete by:  As directed    Increase activity slowly    Complete by:  As directed      Allergies as of 11/17/2016      Reactions   Darvocet [propoxyphene N-acetaminophen] Nausea And Vomiting   Darvon Nausea And Vomiting   Meperidine And Related Nausea And Vomiting   Vicodin [hydrocodone-acetaminophen] Nausea And Vomiting   Demerol [meperidine]       Medication List    STOP taking these medications   atenolol 25 MG tablet Commonly known as:  TENORMIN     TAKE these medications   acetaminophen 325 MG tablet Commonly known as:  TYLENOL Take 650 mg by mouth every 4 (four) hours as needed for mild pain, fever or headache.   amoxicillin-clavulanate 250-62.5 MG/5ML suspension Commonly known as:  AUGMENTIN Take 10 mLs (500 mg total) by mouth every 12 (twelve) hours.   anagrelide 1 MG capsule Commonly known as:  AGRYLIN Take 3 mg by mouth daily.   aspirin 81 MG tablet Take 81 mg by mouth daily.   azithromycin 200 MG/5ML suspension Commonly known as:  ZITHROMAX Take 12.5 mLs (500 mg total) by mouth daily. Start taking on:  11/18/2016   calcitonin (salmon) 200 UNIT/ACT nasal spray Commonly known as:  MIACALCIN/FORTICAL Place 1 spray into alternate nostrils daily.   CALCIUM 600/VITAMIN D 600-400 MG-UNIT Tabs Generic drug:  Calcium Carbonate-Vitamin D3 Take 1 tablet by mouth 2 (two) times daily with a meal.   donepezil 10 MG tablet Commonly known as:  ARICEPT Take one tablet at bedtime for memory   dorzolamide-timolol 22.3-6.8 MG/ML ophthalmic solution Commonly known as:  COSOPT Place 1 drop into both eyes 2 (two) times daily. 2% - 0.5%.   feeding supplement Liqd Take 90 mLs by mouth 2 (two) times daily between meals.   flurbiprofen 100 MG tablet Commonly known as:  ANSAID TAKE 1 TABLET BY MOUTH EVERY DAY   free water Soln Take 300 mLs by mouth every 4 (four) hours.   latanoprost 0.005 % ophthalmic solution Commonly known as:  XALATAN Place 1  drop into both eyes at bedtime.   LORazepam 1 MG tablet Commonly known as:  ATIVAN Take 0.25 mg by mouth as needed for sleep.   Magnesium 250 MG Tabs Take 1 tablet by mouth daily.   memantine 10 MG tablet Commonly known as:  NAMENDA One twice daily to help preserve memory   metoprolol 50 MG tablet Commonly known as:  LOPRESSOR Take 1 tablet (50 mg total) by mouth 2 (two) times daily.   mirtazapine 15 MG tablet Commonly known as:  REMERON Take 30 mg by mouth at bedtime.   polyethylene glycol packet Commonly known as:  MIRALAX / GLYCOLAX Take 17 g by mouth daily.   URELLE 81 MG Tabs tablet Take 1 tablet by mouth at bedtime as needed.   vitamin E 400 UNIT capsule Take 400 Units by mouth daily.       Allergies  Allergen Reactions  . Darvocet [Propoxyphene N-Acetaminophen] Nausea And Vomiting  . Darvon Nausea And Vomiting  . Meperidine And Related Nausea And Vomiting  . Vicodin [Hydrocodone-Acetaminophen] Nausea And Vomiting  . Demerol [Meperidine]     Consultations:  None   Procedures/Studies: Dg Chest 1 View  Result Date: 11/14/2016 CLINICAL DATA:  Fevers EXAM: CHEST 1  VIEW COMPARISON:  None. FINDINGS: Cardiac shadow is mildly enlarged in size. The thoracic aorta is tortuous. Diffuse left basilar infiltrate is seen. Mild right basilar atelectasis is noted. No bony abnormality is seen. IMPRESSION: Left lower lobe pneumonia. Electronically Signed   By: Inez Catalina M.D.   On: 11/14/2016 15:35   Ct Head Wo Contrast  Result Date: 11/12/2016 CLINICAL DATA:  Syncopal episode with fall EXAM: CT HEAD WITHOUT CONTRAST CT MAXILLOFACIAL WITHOUT CONTRAST TECHNIQUE: Multidetector CT imaging of the head and maxillofacial structures were performed using the standard protocol without intravenous contrast. Multiplanar CT image reconstructions of the maxillofacial structures were also generated. COMPARISON:  Head CT January 08, 2016 FINDINGS: CT HEAD FINDINGS Brain: Moderate diffuse  atrophy is stable. There is no intracranial mass, hemorrhage, extra-axial fluid collection, or midline shift. There is mild small vessel disease in the centra semiovale bilaterally. Elsewhere gray-white compartments appear normal. No acute infarct evident. Vascular: There is no hyperdense vessel. There is calcification in each carotid siphon. Skull: The bony calvarium appears intact. There is a small benign enostosis arising in the right temporal region, stable. Other: Mastoid air cells are clear. CT MAXILLOFACIAL FINDINGS Osseous: There is no evident fracture or dislocation. No blastic or lytic bone lesions are evident. 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 No intraorbital lesions are appreciable. Sinuses: Paranasal sinuses are clear. Ostiomeatal unit complexes are patent bilaterally. There is edema along the inferior aspect of the right nasal turbinate. No nares obstruction. There is minimal rightward deviation of the nasal septum. Soft tissues: There is moderate soft tissue swelling and mild soft tissue air over the left lower to mid face region. No soft tissue mass or abscess present. There is subcutaneous thickening in the area of trauma over the mid face on the left. Salivary glands appear symmetric bilaterally. No adenopathy evident. Tongue and tongue base regions are unremarkable. Visualize pharynx appears normal. There is extensive arthropathy in the visualized cervical spine. IMPRESSION: CT head: Stable moderate generalized atrophy with mild periventricular small vessel disease. No intracranial mass, hemorrhage, or extra-axial fluid collection. No evident acute infarct. Vascular calcification noted in the carotid siphon regions. CT maxillofacial: Soft tissue edema with likely subcutaneous hemorrhage throughout the left lower to mid face  region. A small amount of soft tissue air is noted in this region. No acute fracture or dislocation. No intraorbital lesion. Paranasal sinuses are clear. Ostiomeatal unit complexes patent bilaterally. There is slight rightward deviation of the nasal septum. There is extensive arthropathy noted in the visualized cervical spine. 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 Electronically Signed   By: Lowella Grip III M.D.   On: 11/12/2016 16:14   Dg Abd 2 Views  Result Date: 11/12/2016 CLINICAL DATA:  Chronic constipation EXAM: ABDOMEN - 2 VIEW COMPARISON:  None. FINDINGS: Scattered large and small bowel gas is noted. Mild small bowel dilatation is seen. No free air is noted. No acute bony abnormality is seen. Degenerative change of the lumbar spine is noted. IMPRESSION: Mild small bowel dilatation is seen. Correlation with physical exam is recommended. This may represent a partial small bowel obstruction or small-bowel ileus. Electronically Signed   By: Inez Catalina M.D.   On: 11/12/2016 14:46   Ct Maxillofacial Wo Contrast  Result Date: 11/12/2016 CLINICAL DATA:  Syncopal  episode with fall EXAM: CT HEAD WITHOUT CONTRAST CT MAXILLOFACIAL WITHOUT CONTRAST TECHNIQUE: Multidetector CT imaging of the head and maxillofacial structures were performed using the standard protocol without intravenous contrast. Multiplanar CT image reconstructions of the maxillofacial structures were also generated. COMPARISON:  Head CT January 08, 2016 FINDINGS: CT HEAD FINDINGS Brain: Moderate diffuse atrophy is stable. There is no intracranial mass, hemorrhage, extra-axial fluid collection, or midline shift. There is mild small vessel disease in the centra semiovale bilaterally. Elsewhere gray-white compartments appear normal. No acute infarct evident. Vascular: There is no hyperdense vessel. There is calcification in each  carotid siphon. Skull: The bony calvarium appears intact. There is a small benign enostosis arising in the right temporal region, stable. Other: Mastoid air cells are clear. CT MAXILLOFACIAL FINDINGS Osseous: There is no evident fracture or dislocation. No blastic or lytic bone lesions are evident. 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 No intraorbital lesions are appreciable. Sinuses: Paranasal sinuses are clear. Ostiomeatal unit complexes are patent bilaterally. There is edema along the inferior aspect of the right nasal turbinate. No nares obstruction. There is minimal rightward deviation of the nasal septum. Soft tissues: There is moderate soft tissue swelling and mild soft tissue air over the left lower to mid face region. No soft tissue mass or abscess present. There is subcutaneous thickening in the area of trauma over the mid face on the left. Salivary glands appear symmetric bilaterally. No adenopathy evident. Tongue and tongue base regions are unremarkable. Visualize pharynx appears normal. There is extensive arthropathy in the visualized cervical spine. IMPRESSION: CT head: Stable moderate generalized atrophy with mild periventricular small vessel disease. No intracranial mass, hemorrhage, or extra-axial fluid collection. No evident acute infarct. Vascular calcification noted in the carotid siphon regions. CT maxillofacial: Soft tissue edema with likely subcutaneous hemorrhage throughout the left lower to mid face region. A small amount of soft tissue air is noted in this region. No acute fracture or dislocation. No intraorbital lesion. Paranasal sinuses are clear. Ostiomeatal unit complexes patent bilaterally. There is slight rightward deviation of the nasal septum. There is extensive arthropathy noted in the visualized cervical spine.  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 Electronically Signed   By: Lowella Grip III M.D.   On: 11/12/2016 16:14     Subjective: No complains  Discharge Exam: Vitals:   11/16/16 2247 11/17/16 0539  BP: (!) 151/56 (!) 130/51  Pulse: 73 70  Resp: 18 18  Temp: 98 F (36.7 C) 97.7 F (36.5 C)   Vitals:   11/16/16 0636 11/16/16 1520 11/16/16 2247 11/17/16 0539  BP: (!) 153/60 (!) 153/60 (!) 151/56 (!) 130/51  Pulse: 66 67 73 70  Resp: (!) 22 17 18 18   Temp: 97.6 F (36.4 C) 98.5 F (36.9 C) 98 F (36.7 C) 97.7 F (36.5 C)  TempSrc: Oral Oral Oral Oral  SpO2: 93% 94% 95% 95%  Weight:      Height:        General: Pt is alert, awake, not in acute distress Cardiovascular: RRR, S1/S2 +, no rubs, no gallops Respiratory: CTA bilaterally, no wheezing, no rhonchi Abdominal: Soft, NT, ND, bowel sounds + Extremities: no edema, no cyanosis    The results of significant diagnostics from this hospitalization (including imaging, microbiology, ancillary and laboratory) are  listed below for reference.     Microbiology: Recent Results (from the past 240 hour(s))  MRSA PCR Screening     Status: None   Collection Time: 11/12/16 12:33 PM  Result Value Ref Range Status   MRSA by PCR NEGATIVE NEGATIVE Final    Comment:        The GeneXpert MRSA Assay (FDA approved for NASAL specimens only), is one component of a comprehensive MRSA colonization surveillance program. It is not intended to diagnose MRSA infection nor to guide or monitor treatment for MRSA infections.   Urine culture     Status: Abnormal   Collection Time: 11/12/16  6:45 PM  Result Value Ref Range Status   Specimen Description URINE, CLEAN CATCH  Final   Special Requests NONE  Final   Culture 80,000 COLONIES/mL ESCHERICHIA COLI (A)  Final   Report Status 11/17/2016 FINAL  Final   Organism ID,  Bacteria ESCHERICHIA COLI (A)  Final      Susceptibility   Escherichia coli - MIC*    AMPICILLIN <=2 SENSITIVE Sensitive     CEFAZOLIN <=4 SENSITIVE Sensitive     CEFTRIAXONE <=1 SENSITIVE Sensitive     CIPROFLOXACIN <=0.25 SENSITIVE Sensitive     GENTAMICIN <=1 SENSITIVE Sensitive     IMIPENEM <=0.25 SENSITIVE Sensitive     NITROFURANTOIN <=16 SENSITIVE Sensitive     TRIMETH/SULFA <=20 SENSITIVE Sensitive     AMPICILLIN/SULBACTAM <=2 SENSITIVE Sensitive     PIP/TAZO <=4 SENSITIVE Sensitive     Extended ESBL NEGATIVE Sensitive     * 80,000 COLONIES/mL ESCHERICHIA COLI  Culture, blood (Routine X 2) w Reflex to ID Panel     Status: None (Preliminary result)   Collection Time: 11/14/16  3:19 PM  Result Value Ref Range Status   Specimen Description BLOOD RIGHT HAND  Final   Special Requests IN PEDIATRIC BOTTLE 2CC  Final   Culture   Final    NO GROWTH 2 DAYS Performed at [facility]    Report Status PENDING  Incomplete  Culture, blood (Routine X 2) w Reflex to ID Panel     Status: None (Preliminary result)   Collection Time: 11/14/16  3:24 PM  Result Value Ref Range Status   Specimen Description BLOOD RIGHT ANTECUBITAL  Final   Special Requests IN PEDIATRIC BOTTLE 2CC  Final   Culture   Final    NO GROWTH 2 DAYS Performed at [facility]    Report Status PENDING  Incomplete     Labs: BNP (last 3 results) No results for input(s): BNP in the last 8760 hours. Basic Metabolic Panel:  Recent Labs Lab 11/13/16 0527 11/14/16 0353 11/15/16 0537 11/16/16 0515 11/17/16 0557  NA 149* 142 143 150* 150*  K 3.4* 3.1* 4.0 3.6 3.0*  CL 112* 108 111 118* 120*  CO2 24 26 24 22 22   GLUCOSE 109* 113* 96 91 134*  BUN 45* 51* 52* 43* 40*  CREATININE 1.23* 1.69* 1.50* 1.10* 0.92  CALCIUM 7.9* 7.7* 7.6* 7.6* 7.9*  MG 2.3  --   --   --   --    Liver Function Tests:  Recent Labs Lab 11/12/16 0813  AST 19  ALT 10*  ALKPHOS 57  BILITOT 0.6  PROT 6.9  ALBUMIN 3.8     Recent Labs Lab 11/12/16 0813  LIPASE 16   No results for input(s): AMMONIA in the last 168 hours. CBC:  Recent Labs Lab 11/12/16 0813 11/14/16 1519 11/15/16 0537 11/16/16  0515  WBC 8.3 10.9* 9.5 11.7*  NEUTROABS 6.7  --   --   --   HGB 10.1* 9.3* 8.6* 8.8*  HCT 32.7* 30.2* 28.2* 28.9*  MCV 93.7 95.6 94.9 98.0  PLT 275 210 204 209   Cardiac Enzymes: No results for input(s): CKTOTAL, CKMB, CKMBINDEX, TROPONINI in the last 168 hours. BNP: Invalid input(s): POCBNP CBG: No results for input(s): GLUCAP in the last 168 hours. D-Dimer No results for input(s): DDIMER in the last 72 hours. Hgb A1c No results for input(s): HGBA1C in the last 72 hours. Lipid Profile No results for input(s): CHOL, HDL, LDLCALC, TRIG, CHOLHDL, LDLDIRECT in the last 72 hours. Thyroid function studies No results for input(s): TSH, T4TOTAL, T3FREE, THYROIDAB in the last 72 hours.  Invalid input(s): FREET3 Anemia work up No results for input(s): VITAMINB12, FOLATE, FERRITIN, TIBC, IRON, RETICCTPCT in the last 72 hours. Urinalysis    Component Value Date/Time   COLORURINE YELLOW 11/14/2016 1845   APPEARANCEUR CLEAR 11/14/2016 1845   LABSPEC 1.020 11/14/2016 1845   LABSPEC 1.010 06/26/2011 1500   PHURINE 5.5 11/14/2016 1845   GLUCOSEU NEGATIVE 11/14/2016 1845   HGBUR NEGATIVE 11/14/2016 1845   BILIRUBINUR NEGATIVE 11/14/2016 1845   BILIRUBINUR Negative 06/26/2011 1500   KETONESUR NEGATIVE 11/14/2016 1845   PROTEINUR NEGATIVE 11/14/2016 1845   NITRITE NEGATIVE 11/14/2016 1845   LEUKOCYTESUR NEGATIVE 11/14/2016 1845   LEUKOCYTESUR Small 06/26/2011 1500   Sepsis Labs Invalid input(s): PROCALCITONIN,  WBC,  LACTICIDVEN Microbiology Recent Results (from the past 240 hour(s))  MRSA PCR Screening     Status: None   Collection Time: 11/12/16 12:33 PM  Result Value Ref Range Status   MRSA by PCR NEGATIVE NEGATIVE Final    Comment:        The GeneXpert MRSA Assay (FDA approved for NASAL  specimens only), is one component of a comprehensive MRSA colonization surveillance program. It is not intended to diagnose MRSA infection nor to guide or monitor treatment for MRSA infections.   Urine culture     Status: Abnormal   Collection Time: 11/12/16  6:45 PM  Result Value Ref Range Status   Specimen Description URINE, CLEAN CATCH  Final   Special Requests NONE  Final   Culture 80,000 COLONIES/mL ESCHERICHIA COLI (A)  Final   Report Status 11/17/2016 FINAL  Final   Organism ID, Bacteria ESCHERICHIA COLI (A)  Final      Susceptibility   Escherichia coli - MIC*    AMPICILLIN <=2 SENSITIVE Sensitive     CEFAZOLIN <=4 SENSITIVE Sensitive     CEFTRIAXONE <=1 SENSITIVE Sensitive     CIPROFLOXACIN <=0.25 SENSITIVE Sensitive     GENTAMICIN <=1 SENSITIVE Sensitive     IMIPENEM <=0.25 SENSITIVE Sensitive     NITROFURANTOIN <=16 SENSITIVE Sensitive     TRIMETH/SULFA <=20 SENSITIVE Sensitive     AMPICILLIN/SULBACTAM <=2 SENSITIVE Sensitive     PIP/TAZO <=4 SENSITIVE Sensitive     Extended ESBL NEGATIVE Sensitive     * 80,000 COLONIES/mL ESCHERICHIA COLI  Culture, blood (Routine X 2) w Reflex to ID Panel     Status: None (Preliminary result)   Collection Time: 11/14/16  3:19 PM  Result Value Ref Range Status   Specimen Description BLOOD RIGHT HAND  Final   Special Requests IN PEDIATRIC BOTTLE 2CC  Final   Culture   Final    NO GROWTH 2 DAYS Performed at [facility]    Report Status PENDING  Incomplete  Culture, blood (  Routine X 2) w Reflex to ID Panel     Status: None (Preliminary result)   Collection Time: 11/14/16  3:24 PM  Result Value Ref Range Status   Specimen Description BLOOD RIGHT ANTECUBITAL  Final   Special Requests IN PEDIATRIC BOTTLE 2CC  Final   Culture   Final    NO GROWTH 2 DAYS Performed at [facility]    Report Status PENDING  Incomplete     Time coordinating discharge: Over 30 minutes  SIGNED:   Charlynne Cousins, MD  [facility] 11/17/2016, 12:54 PM Pager   If 7PM-7AM, please contact night-coverage [email or web address] Password [email or web address]

## 2016-11-18 ENCOUNTER — Telehealth: Payer: Self-pay

## 2016-11-18 ENCOUNTER — Encounter: Payer: Self-pay | Admitting: Nurse Practitioner

## 2016-11-18 ENCOUNTER — Non-Acute Institutional Stay (SKILLED_NURSING_FACILITY): Payer: Medicare Other | Admitting: Nurse Practitioner

## 2016-11-18 DIAGNOSIS — J189 Pneumonia, unspecified organism: Secondary | ICD-10-CM

## 2016-11-18 DIAGNOSIS — F4323 Adjustment disorder with mixed anxiety and depressed mood: Secondary | ICD-10-CM

## 2016-11-18 DIAGNOSIS — F0390 Unspecified dementia without behavioral disturbance: Secondary | ICD-10-CM

## 2016-11-18 DIAGNOSIS — S01412S Laceration without foreign body of left cheek and temporomandibular area, sequela: Secondary | ICD-10-CM

## 2016-11-18 DIAGNOSIS — I10 Essential (primary) hypertension: Secondary | ICD-10-CM

## 2016-11-18 DIAGNOSIS — N179 Acute kidney failure, unspecified: Secondary | ICD-10-CM | POA: Diagnosis not present

## 2016-11-18 DIAGNOSIS — R627 Adult failure to thrive: Secondary | ICD-10-CM

## 2016-11-18 DIAGNOSIS — S0181XS Laceration without foreign body of other part of head, sequela: Secondary | ICD-10-CM

## 2016-11-18 DIAGNOSIS — K5904 Chronic idiopathic constipation: Secondary | ICD-10-CM

## 2016-11-18 DIAGNOSIS — D473 Essential (hemorrhagic) thrombocythemia: Secondary | ICD-10-CM | POA: Diagnosis not present

## 2016-11-18 DIAGNOSIS — J181 Lobar pneumonia, unspecified organism: Secondary | ICD-10-CM | POA: Diagnosis not present

## 2016-11-18 DIAGNOSIS — E876 Hypokalemia: Secondary | ICD-10-CM | POA: Diagnosis not present

## 2016-11-18 DIAGNOSIS — D649 Anemia, unspecified: Secondary | ICD-10-CM

## 2016-11-18 NOTE — Assessment & Plan Note (Signed)
MiraLax daily.  

## 2016-11-18 NOTE — Assessment & Plan Note (Signed)
12/16/15 MMSE 24/30, 25/30, continue AL for care needs. Supervision for safety 02/20/16 MMSE 24/30. Failed clock drawing Continue Aricept, Namenda

## 2016-11-18 NOTE — Progress Notes (Signed)
Location:  [facility] Room Number: 5 Place of Service:  SNF (31) Provider:  Marli Diego, [place]  NP  [name], MD  Patient Care Team: Estill Dooms, MD as PCP - General (Internal Medicine) [place] Ruqayya Ventress Otho Darner, NP as Nurse Practitioner (Nurse Practitioner) Truitt Merle, MD as Consulting Physician (Hematology)  Extended Emergency Contact Information Primary Emergency Contact: Jillyn [place] of [place] Phone: [phone] Mobile Phone: [phone] Relation: Son Secondary Emergency Contact: Inis Sizer [place] of [place] Mobile Phone: [phone] Relation: Grandson  Code Status: DNR Goals of care: Advanced Directive information Advanced Directives 11/19/2016  Does Patient Have a Medical Advance Directive? Yes  Type of Paramedic of [place];Living will;Out of facility DNR (pink MOST or yellow form)  Does patient want to make changes to medical advance directive? -  Copy of [place] in Chart? Yes  Would patient like information on creating a medical advance directive? -  Pre-existing out of facility DNR order (yellow form or pink MOST form) Yellow form placed in chart (order not valid for inpatient use)     Chief Complaint  Patient presents with  . Acute Visit    HPI:  [name] [name] is a 83 y.o. female seen today for an acute visit for f/u hospital stay from 11/12/16 to 11/17/16 for face lacerations, CT head showed atrophy of brain,  sutures intact, no s/s of wound infection, syncope due to CAP(left lower lobe pneumonia) Augmentin and Azithromycin to be [facility] at [facility]. AKI was corrected with IVF  10/19/16 Darbepoetin alfa 167mcg inj              Hx of HTN, controlled on Metoprolol 50mg  bid, essential thrombocythemia, takes Agrylin 0.5mg  bid, plt 326 10/19/16, memory, impaired short terms mostly, talking Namenda, Aricept, resides in SNF for care need, mood and weight are managed with  Mirtazapine 15mg , Lorazepam 0.25mg  hs prn  Past Medical History:  Diagnosis Date  . Abdominal pain, unspecified site 08/11/2012  . Anemia, unspecified 06/18/2011  . Arthritis   . BBB (bundle branch block) 03/03/2006  . Cardiac dysrhythmia, unspecified 03/03/2006  . Chronic interstitial cystitis 03/03/2006  . 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 Disorder of bone and cartilage, unspecified 04/20/2007  . Diverticulosis of colon (without mention of hemorrhage) 04/20/2007  . Essential thrombocythemia ([place]) 06/18/2011  . Glaucoma   . Hematuria, unspecified 04/22/2004  . Hemorrhage of gastrointestinal tract, unspecified 03/09/2007  . Herpes zoster without mention of complication [id number]  . Hypertension   . 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 Loss of weight 06/18/2011  . Lumbago 12/18/2010  . Memory loss 12/19/2009  . Neck fracture ([place])   . Osteoarthrosis, unspecified whether generalized or localized, unspecified site 03/03/2006  . Other and unspecified hyperlipidemia 09/01/2006  . Sciatica 06/19/2010   Past Surgical History:  Procedure Laterality Date  . ABDOMINAL HYSTERECTOMY    . APPENDECTOMY    . BREAST CYST EXCISION Bilateral    benign  . CATARACT EXTRACTION W/ INTRAOCULAR LENS  IMPLANT, BILATERAL    . TONSILLECTOMY      Allergies  Allergen Reactions  . Darvocet [Propoxyphene N-Acetaminophen] Nausea And Vomiting  . Darvon Nausea And Vomiting  . Meperidine And Related Nausea And Vomiting  . Vicodin [Hydrocodone-Acetaminophen] Nausea And Vomiting  . Demerol [Meperidine]     Allergies as of 11/18/2016      Reactions   Darvocet [propoxyphene N-acetaminophen] Nausea And Vomiting   Darvon Nausea And Vomiting  Meperidine And Related Nausea And Vomiting   Vicodin [hydrocodone-acetaminophen] Nausea And Vomiting   Demerol [meperidine]       Medication List       Accurate as of 11/18/16 11:59 PM. Always use your most recent med list.          acetaminophen 325 MG tablet Commonly  known as:  TYLENOL Take 650 mg by mouth every 4 (four) hours as needed for mild pain, fever or headache.   amoxicillin-clavulanate 250-62.5 MG/5ML suspension Commonly known as:  AUGMENTIN Take 10 mLs (500 mg total) by mouth every 12 (twelve) hours.   anagrelide 1 MG capsule Commonly known as:  AGRYLIN Take 3 mg by mouth daily.   aspirin 81 MG tablet Take 81 mg by mouth daily.   azithromycin 200 MG/5ML suspension Commonly known as:  ZITHROMAX Take 12.5 mLs (500 mg total) by mouth daily.   calcitonin (salmon) 200 UNIT/ACT nasal spray Commonly known as:  MIACALCIN/FORTICAL Place 1 spray into alternate nostrils daily.   CALCIUM 600/VITAMIN D 600-400 MG-UNIT Tabs Generic drug:  Calcium Carbonate-Vitamin D3 Take 1 tablet by mouth 2 (two) times daily with a meal.   donepezil 10 MG tablet Commonly known as:  ARICEPT Take one tablet at bedtime for memory   dorzolamide-timolol 22.3-6.8 MG/ML ophthalmic solution Commonly known as:  COSOPT Place 1 drop into both eyes 2 (two) times daily. 2% - 0.5%.   feeding supplement Liqd Take 90 mLs by mouth 2 (two) times daily between meals.   flurbiprofen 100 MG tablet Commonly known as:  ANSAID TAKE 1 TABLET BY MOUTH EVERY DAY   free water Soln Take 300 mLs by mouth every 4 (four) hours.   latanoprost 0.005 % ophthalmic solution Commonly known as:  XALATAN Place 1 drop into both eyes at bedtime.   LORazepam 1 MG tablet Commonly known as:  ATIVAN Take 0.25 mg by mouth as needed for sleep.   Magnesium 250 MG Tabs Take 1 tablet by mouth daily.   memantine 10 MG tablet Commonly known as:  NAMENDA One twice daily to help preserve memory   metoprolol 50 MG tablet Commonly known as:  LOPRESSOR Take 1 tablet (50 mg total) by mouth 2 (two) times daily.   mirtazapine 15 MG tablet Commonly known as:  REMERON Take 30 mg by mouth at bedtime.   polyethylene glycol packet Commonly known as:  MIRALAX / GLYCOLAX Take 17 g by mouth  daily.   URELLE 81 MG Tabs tablet Take 1 tablet by mouth at bedtime as needed.   vitamin E 400 UNIT capsule Take 400 Units by mouth daily.       Review of Systems  Constitutional: Positive for activity change, appetite change and fatigue. Negative for diaphoresis, fever and unexpected weight change.  HENT: Positive for voice change (hoarse since September 2014). Negative for congestion, ear discharge, ear pain, hearing loss, postnasal drip, rhinorrhea, sore throat, tinnitus and trouble swallowing.   Eyes: Negative.  Negative for pain, redness, itching and visual disturbance.  Respiratory: Negative.  Negative for cough, choking, shortness of breath and wheezing.   Cardiovascular: Negative.  Negative for chest pain, palpitations and leg swelling.  Gastrointestinal: Negative.  Negative for abdominal distention, abdominal pain, constipation, diarrhea and nausea.  Endocrine: Negative.  Negative for cold intolerance, heat intolerance, polydipsia, polyphagia and polyuria.  Genitourinary: Negative.  Negative for difficulty urinating, dysuria, flank pain, frequency, hematuria, pelvic pain, urgency and vaginal discharge.  Musculoskeletal: Negative.  Negative for arthralgias, back pain, gait problem,  myalgias, neck pain and neck stiffness.  Skin: Positive for wound. Negative for color change, pallor and rash.       Left facial laceration, suture closer, no s/s of wound infection.   Allergic/Immunologic: Negative.   Neurological: Negative for dizziness, tremors, seizures, syncope, weakness, numbness and headaches.       Memory loss  Hematological: Negative for adenopathy. Does not bruise/bleed easily.       Thrombocythemia  Psychiatric/Behavioral: Negative.  Negative for agitation, behavioral problems, confusion, dysphoric mood, hallucinations, sleep disturbance and suicidal ideas. The patient is not nervous/anxious and is not hyperactive.     Immunization History  Administered Date(s)  Administered  . Influenza Whole 08/09/2012, 07/10/2013  . Influenza-Unspecified 08/14/2014, 08/08/2015, 08/26/2016  . Pneumococcal Polysaccharide-23 11/09/2004  . Zoster 10/07/2006   Pertinent  Health Maintenance Due  Topic Date Due  . PNA vac Low Risk Adult (2 of 2 - PCV13) 11/09/2005  . INFLUENZA VACCINE  Completed  . DEXA SCAN  Completed   Fall Risk  02/27/2016 12/19/2015 08/22/2015 02/21/2015 09/07/2014  Falls in the past year? No No No No No   Functional Status Survey:    Vitals:   11/18/16 1418  BP: 120/60  Pulse: 68  Resp: 20  Temp: (!) 100.8 F (38.2 C)  Weight: 94 lb (42.6 kg)  Height: 5\' 1"  (1.549 m)   Body mass index is 17.76 kg/m. Physical Exam  Constitutional: She is oriented to person, place, and time. She appears well-developed and well-nourished. No distress.  HENT:  Head: Normocephalic and atraumatic.  Right Ear: External ear normal.  Left Ear: External ear normal.  Nose: Nose normal.  Mouth/Throat: Oropharynx is clear and moist.  Eyes: Conjunctivae and EOM are normal. Pupils are equal, round, and reactive to light.  Corrective lenses  Neck: Neck supple. No JVD present. No tracheal deviation present. No thyromegaly present.  Cardiovascular: Normal rate and regular rhythm.  Exam reveals no gallop and no friction rub.   Murmur (2/6/LSB SEM) heard. Pulmonary/Chest: No respiratory distress. She has no wheezes. She has no rales.  Abdominal: She exhibits no distension and no mass. There is no tenderness.  Musculoskeletal: She exhibits no edema or tenderness.  Heberden's nodes. Bouchard's nodes. Contracture of the right hand.  Lymphadenopathy:    She has no cervical adenopathy.  Neurological: She is alert and oriented to person, place, and time. She has normal reflexes. No cranial nerve deficit. Coordination normal.  08/22/2015 MMSE 28/30. Failed clock drawing. 09/17/16 MMSE 25/30. Failed clock drawing.  Skin: No rash noted. No erythema. No pallor.  Left  facial laceration, suture closer, no s/s of wound infection. Bruise under the left eye corner   Psychiatric: She has a normal mood and affect. Her behavior is normal. Judgment and thought content normal.    Labs reviewed:  Recent Labs  11/13/16 0527  11/15/16 0537 11/16/16 0515 11/17/16 0557 11/19/16  NA 149*  < > 143 150* 150* 158*  K 3.4*  < > 4.0 3.6 3.0* 4.4  CL 112*  < > 111 118* 120*  --   CO2 24  < > 24 22 22   --   GLUCOSE 109*  < > 96 91 134*  --   BUN 45*  < > 52* 43* 40* 37*  CREATININE 1.23*  < > 1.50* 1.10* 0.92 1.1  CALCIUM 7.9*  < > 7.6* 7.6* 7.9*  --   MG 2.3  --   --   --   --   --   < > =  values in this interval not displayed.  Recent Labs  09/21/16 1018 10/19/16 0815 11/12/16 0813  AST 17 16 19   ALT 8 10 10*  ALKPHOS 73 63 57  BILITOT 0.47 0.35 0.6  PROT 6.5 6.1* 6.9  ALBUMIN 3.4* 3.2* 3.8    Recent Labs  09/21/16 1018  10/19/16 0815  11/12/16 0813 11/14/16 1519 11/15/16 0537 11/16/16 0515 11/19/16  WBC 5.8  < > 5.3  < > 8.3 10.9* 9.5 11.7* 10.0  NEUTROABS 3.7  --  3.4  --  6.7  --   --   --   --   HGB 8.6*  < > 9.1*  < > 10.1* 9.3* 8.6* 8.8* 9.1*  HCT 28.1*  < > 29.7*  < > 32.7* 30.2* 28.2* 28.9* 29*  MCV 95.6  --  97.4  --  93.7 95.6 94.9 98.0  --   PLT 312  < > 326  < > 275 210 204 209 382  < > = values in this interval not displayed. Lab Results  Component Value Date   TSH 0.850 11/12/2016   No results found for: HGBA1C Lab Results  Component Value Date   CHOL 174 07/14/2016   HDL 59 07/14/2016   LDLCALC 92 07/14/2016   TRIG 117 07/14/2016    Significant Diagnostic Results in last 30 days:  Dg Chest 1 View  Result Date: 11/14/2016 CLINICAL DATA:  Fevers EXAM: CHEST 1 VIEW COMPARISON:  None. FINDINGS: Cardiac shadow is mildly enlarged in size. The thoracic aorta is tortuous. Diffuse left basilar infiltrate is seen. Mild right basilar atelectasis is noted. No bony abnormality is seen. IMPRESSION: Left lower lobe pneumonia.  Electronically Signed   By: Inez Catalina M.D.   On: 11/14/2016 15:35   Ct Head Wo Contrast  Result Date: 11/12/2016 CLINICAL DATA:  Syncopal episode with fall EXAM: CT HEAD WITHOUT CONTRAST CT MAXILLOFACIAL WITHOUT CONTRAST TECHNIQUE: Multidetector CT imaging of the head and maxillofacial structures were performed using the standard protocol without intravenous contrast. Multiplanar CT image reconstructions of the maxillofacial structures were also generated. COMPARISON:  Head CT January 08, 2016 FINDINGS: CT HEAD FINDINGS Brain: Moderate diffuse atrophy is stable. There is no intracranial mass, hemorrhage, extra-axial fluid collection, or midline shift. There is mild small vessel disease in the centra semiovale bilaterally. Elsewhere gray-white compartments appear normal. No acute infarct evident. Vascular: There is no hyperdense vessel. There is calcification in each carotid siphon. Skull: The bony calvarium appears intact. There is a small benign enostosis arising in the right temporal region, stable. Other: Mastoid air cells are clear. CT MAXILLOFACIAL FINDINGS Osseous: There is no evident fracture or dislocation. No blastic or lytic bone lesions are evident. 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 No intraorbital lesions are appreciable. Sinuses: Paranasal sinuses are clear. Ostiomeatal unit complexes are patent bilaterally. There is edema along the inferior aspect of the right nasal turbinate. No nares obstruction. There is minimal rightward deviation of the nasal septum. Soft tissues: There is moderate soft tissue swelling and mild soft tissue air over the left lower to mid face region. No soft tissue mass or abscess present. There is subcutaneous thickening in the area of trauma over the mid face on the left. Salivary glands appear symmetric  bilaterally. No adenopathy evident. Tongue and tongue base regions are unremarkable. Visualize pharynx appears normal. There is extensive arthropathy in  the visualized cervical spine. IMPRESSION: CT head: Stable moderate generalized atrophy with mild periventricular small vessel disease. No intracranial mass, hemorrhage, or extra-axial fluid collection. No evident acute infarct. Vascular calcification noted in the carotid siphon regions. CT maxillofacial: Soft tissue edema with likely subcutaneous hemorrhage throughout the left lower to mid face region. A small amount of soft tissue air is noted in this region. No acute fracture or dislocation. No intraorbital lesion. Paranasal sinuses are clear. Ostiomeatal unit complexes patent bilaterally. There is slight rightward deviation of the nasal septum. There is extensive arthropathy noted in the visualized cervical spine. 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 Electronically Signed   By: Lowella Grip III M.D.   On: 11/12/2016 16:14   Dg Abd 2 Views  Result Date: 11/12/2016 CLINICAL DATA:  Chronic constipation EXAM: ABDOMEN - 2 VIEW COMPARISON:  None. FINDINGS: Scattered large and small bowel gas is noted. Mild small bowel dilatation is seen. No free air is noted. No acute bony abnormality is seen. Degenerative change of the lumbar spine is noted. IMPRESSION: Mild small bowel dilatation is seen. Correlation with physical exam is recommended. This may represent a partial small bowel obstruction or small-bowel ileus. Electronically Signed   By: Inez Catalina M.D.   On: 11/12/2016 14:46   Ct Maxillofacial Wo Contrast  Result Date: 11/12/2016 CLINICAL DATA:  Syncopal episode with fall EXAM: CT HEAD WITHOUT CONTRAST CT MAXILLOFACIAL WITHOUT CONTRAST TECHNIQUE: Multidetector CT imaging of the head and maxillofacial structures were performed using the  standard protocol without intravenous contrast. Multiplanar CT image reconstructions of the maxillofacial structures were also generated. COMPARISON:  Head CT January 08, 2016 FINDINGS: CT HEAD FINDINGS Brain: Moderate diffuse atrophy is stable. There is no intracranial mass, hemorrhage, extra-axial fluid collection, or midline shift. There is mild small vessel disease in the centra semiovale bilaterally. Elsewhere gray-white compartments appear normal. No acute infarct evident. Vascular: There is no hyperdense vessel. There is calcification in each carotid siphon. Skull: The bony calvarium appears intact. There is a small benign enostosis arising in the right temporal region, stable. Other: Mastoid air cells are clear. CT MAXILLOFACIAL FINDINGS Osseous: There is no evident fracture or dislocation. No blastic or lytic bone lesions are evident. 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 No intraorbital lesions are appreciable. Sinuses: Paranasal sinuses are clear. Ostiomeatal unit complexes are patent bilaterally. There is edema along the inferior aspect of the right nasal turbinate. No nares obstruction. There is minimal rightward deviation of the nasal septum. Soft tissues: There is moderate soft tissue swelling and mild soft tissue air over the left lower to mid face region. No soft tissue mass or abscess present. There is subcutaneous thickening in the area of trauma over the mid face on the left. Salivary glands appear symmetric bilaterally. No adenopathy evident. Tongue and tongue base regions are unremarkable. Visualize pharynx appears normal. There is extensive arthropathy in the visualized cervical spine. IMPRESSION: CT head: Stable moderate generalized atrophy with mild periventricular small vessel disease. No intracranial mass, hemorrhage, or  extra-axial fluid collection. No evident acute infarct. Vascular calcification noted in the carotid siphon regions. CT maxillofacial: Soft tissue edema with likely subcutaneous hemorrhage throughout the left lower to mid face region. A small amount of soft tissue air is noted in this region. No  acute fracture or dislocation. No intraorbital lesion. Paranasal sinuses are clear. Ostiomeatal unit complexes patent bilaterally. There is slight rightward deviation of the nasal septum. There is extensive arthropathy noted in the visualized cervical spine. 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 Electronically Signed   By: Lowella Grip III M.D.   On: 11/12/2016 16:14    Assessment/Plan Hypertension Controlled, continue Metoprolol 50mg  bid   CAP (community acquired pneumonia) Complete 7 day course of Augmentin and Azithromycin, repeat CXR  Constipation MiraLax daily  Dementia 12/16/15 MMSE 24/30, 25/30, continue AL for care needs. Supervision for safety 02/20/16 MMSE 24/30. Failed clock drawing Continue Aricept, Namenda   AKI (acute kidney injury) ([place]) Repeat BMP CBC  Essential thrombocythemia ([place]) Continue Agrylin 0.5mg  bid, last Plt 209 11/16/16  Anemia Repeat CBC, last Hgb 8.8 11/17/16  Adjustment disorder with mixed anxiety and depressed mood Increased agitation and confusion, will increase Mirtazapine to 30mg . Observe.   Laceration of head Left face, sutures remove 11/20/16  Adult failure to thrive palliative  Facial laceration Left, suture remove 7 days  Hypokalemia K 3.0 11/16/16, repeat BMP      Family/ staff Communication: SNF  Labs/tests ordered:  CBC BMP

## 2016-11-18 NOTE — Assessment & Plan Note (Signed)
Complete 7 day course of Augmentin and Azithromycin, repeat CXR

## 2016-11-18 NOTE — Assessment & Plan Note (Signed)
K 3.0 11/16/16, repeat BMP

## 2016-11-18 NOTE — Assessment & Plan Note (Signed)
Left, suture remove 7 days

## 2016-11-18 NOTE — Assessment & Plan Note (Signed)
Controlled, continue  Metoprolol 50mg bid.  

## 2016-11-18 NOTE — Assessment & Plan Note (Signed)
Repeat BMP CBC

## 2016-11-18 NOTE — Telephone Encounter (Signed)
Possible re-admission to facility. This is a patient you were seeing at [facility] . [facility] F/U is needed if patient was re-admitted to facility upon discharge. Hospital discharge from [facility] on 11/17/16.

## 2016-11-18 NOTE — Assessment & Plan Note (Signed)
Continue Agrylin 0.5mg  bid, last Plt 209 11/16/16

## 2016-11-18 NOTE — Assessment & Plan Note (Signed)
Increased agitation and confusion, will increase Mirtazapine to 30mg . Observe.

## 2016-11-18 NOTE — Assessment & Plan Note (Signed)
Repeat CBC, last Hgb 8.8 11/17/16

## 2016-11-18 NOTE — Assessment & Plan Note (Signed)
palliative

## 2016-11-18 NOTE — Assessment & Plan Note (Signed)
Left face, sutures remove 11/20/16

## 2016-11-19 ENCOUNTER — Non-Acute Institutional Stay (SKILLED_NURSING_FACILITY): Payer: Medicare Other | Admitting: Internal Medicine

## 2016-11-19 ENCOUNTER — Encounter: Payer: Self-pay | Admitting: Internal Medicine

## 2016-11-19 ENCOUNTER — Telehealth: Payer: Self-pay | Admitting: Hematology

## 2016-11-19 DIAGNOSIS — E876 Hypokalemia: Secondary | ICD-10-CM

## 2016-11-19 DIAGNOSIS — R55 Syncope and collapse: Secondary | ICD-10-CM | POA: Diagnosis not present

## 2016-11-19 DIAGNOSIS — D649 Anemia, unspecified: Secondary | ICD-10-CM | POA: Diagnosis not present

## 2016-11-19 DIAGNOSIS — F0390 Unspecified dementia without behavioral disturbance: Secondary | ICD-10-CM

## 2016-11-19 DIAGNOSIS — D638 Anemia in other chronic diseases classified elsewhere: Secondary | ICD-10-CM

## 2016-11-19 DIAGNOSIS — J181 Lobar pneumonia, unspecified organism: Secondary | ICD-10-CM | POA: Diagnosis not present

## 2016-11-19 DIAGNOSIS — R413 Other amnesia: Secondary | ICD-10-CM

## 2016-11-19 DIAGNOSIS — S0181XA Laceration without foreign body of other part of head, initial encounter: Secondary | ICD-10-CM

## 2016-11-19 DIAGNOSIS — R410 Disorientation, unspecified: Secondary | ICD-10-CM | POA: Diagnosis not present

## 2016-11-19 DIAGNOSIS — I10 Essential (primary) hypertension: Secondary | ICD-10-CM

## 2016-11-19 DIAGNOSIS — R5381 Other malaise: Secondary | ICD-10-CM

## 2016-11-19 DIAGNOSIS — E43 Unspecified severe protein-calorie malnutrition: Secondary | ICD-10-CM | POA: Diagnosis not present

## 2016-11-19 DIAGNOSIS — J189 Pneumonia, unspecified organism: Secondary | ICD-10-CM

## 2016-11-19 DIAGNOSIS — D473 Essential (hemorrhagic) thrombocythemia: Secondary | ICD-10-CM

## 2016-11-19 LAB — CBC AND DIFFERENTIAL
HCT: 29 % — AB (ref 36–46)
Hemoglobin: 9.1 g/dL — AB (ref 12.0–16.0)
Platelets: 382 10*3/uL (ref 150–399)
WBC: 10 10*3/mL

## 2016-11-19 LAB — CULTURE, BLOOD (ROUTINE X 2)
Culture: NO GROWTH
Culture: NO GROWTH

## 2016-11-19 LAB — BASIC METABOLIC PANEL
BUN: 37 mg/dL — AB (ref 4–21)
CREATININE: 1.1 mg/dL (ref 0.5–1.1)
Glucose: 98 mg/dL
Potassium: 4.4 mmol/L (ref 3.4–5.3)
Sodium: 158 mmol/L — AB (ref 137–147)

## 2016-11-19 NOTE — Progress Notes (Signed)
History and Physical      Location:  [place] Room Number: [place] of Service:  SNF (31)  PCP: [name], MD Patient Care Team: Estill Dooms, MD as PCP - General (Internal Medicine) [place] Man Otho Darner, NP as Nurse Practitioner (Nurse Practitioner) Truitt Merle, MD as Consulting Physician (Hematology)  Extended Emergency Contact Information Primary Emergency Contact: Jillyn Hidden of [place] Phone: [phone] Mobile Phone: [phone] Relation: Son Secondary Emergency Contact: Inis Sizer [place] of [place] Mobile Phone: [phone] Relation: Grandson  Code Status: DNR Goals of Care: Advanced Directive information Advanced Directives 11/19/2016  Does Patient Have a Medical Advance Directive? Yes  Type of Paramedic of [place];Living will;Out of facility DNR (pink MOST or yellow form)  Does patient want to make changes to medical advance directive? -  Copy of [place] in Chart? Yes  Would patient like information on creating a medical advance directive? -  Pre-existing out of facility DNR order (yellow form or pink MOST form) Yellow form placed in chart (order not valid for inpatient use)      Chief Complaint  Patient presents with  . Readmit To SNF    following hospitalization 11/12/16 to 11/17/16. Had 2 sycope spells hiting left side of face resulting in 2 cm laceration.     HPI: Patient is a 83 y.o. female seen today for admission on 11/17/16 to [facility] [place] hospitalization from 11/12/16 to 11/17/16. She has been living in AL unit of [facility]. She was found unconscious at her commode the day she was sent to the ER.. She sustained a lavceration of the left cheek and a large hematoma and left periorbital ecchymosis. Following admission she had a CXR that suggested pneumonia in the LLL. She was treated for CAP with Rocephin, then Augmentin and  azithromycin.  Patient has dementia and became encephalopathic in the hospital . She is likely still having a problem with this. She is excessively drowsy and answers are not appropriate to questions at times.   Other problems include thrombocythemia in remission on Anegrelide, CKD 2, Hypokalemia durin hospital stay, cerebral atrophy, hyper glcemia with range 91-134 in hospital, and anemia with hgb 8.8 (chronic issue).  Past Medical History:  Diagnosis Date  . Abdominal pain, unspecified site 08/11/2012  . Anemia, unspecified 06/18/2011  . Arthritis   . BBB (bundle branch block) 03/03/2006  . Cardiac dysrhythmia, unspecified 03/03/2006  . Chronic interstitial cystitis 03/03/2006  . 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 Disorder of bone and cartilage, unspecified 04/20/2007  . Diverticulosis of colon (without mention of hemorrhage) 04/20/2007  . Essential thrombocythemia ([place]) 06/18/2011  . Glaucoma   . Hematuria, unspecified 04/22/2004  . Hemorrhage of gastrointestinal tract, unspecified 03/09/2007  . Herpes zoster without mention of complication [id number]  . Hypertension   . 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 Loss of weight 06/18/2011  . Lumbago 12/18/2010  . Memory loss 12/19/2009  . Neck fracture ([place])   . Osteoarthrosis, unspecified whether generalized or localized, unspecified site 03/03/2006  . Other and unspecified hyperlipidemia 09/01/2006  . Sciatica 06/19/2010   Past Surgical History:  Procedure Laterality Date  . ABDOMINAL HYSTERECTOMY    . APPENDECTOMY    . BREAST CYST EXCISION Bilateral    benign  . CATARACT EXTRACTION W/ INTRAOCULAR LENS  IMPLANT, BILATERAL    . TONSILLECTOMY      reports that she has never smoked. She has never  used smokeless tobacco. She reports that she does not drink alcohol or use drugs. Social History   Social History  . Marital status: Widowed    Spouse name: N/A  . Number of children: N/A  . Years of education: N/A   Occupational History  .  Not on file.   Social History Main Topics  . Smoking status: Never Smoker  . Smokeless tobacco: Never Used  . Alcohol use No  . Drug use: No  . Sexual activity: No   Other Topics Concern  . Not on file   Social History Narrative   Lives at [facility], moved to [place] 12/26/15   Widowed   Never smoked   Alcohol none   Exercise none does own house cleaning   POA/Living will     Functional Status Survey:    Family History  Problem Relation Age of Onset  . Heart disease Father     MI    Health Maintenance  Topic Date Due  . TETANUS/TDAP  04/16/1942  . PNA vac Low Risk Adult (2 of 2 - PCV13) 11/09/2005  . INFLUENZA VACCINE  Completed  . DEXA SCAN  Completed  . ZOSTAVAX  Completed    Allergies  Allergen Reactions  . Darvocet [Propoxyphene N-Acetaminophen] Nausea And Vomiting  . Darvon Nausea And Vomiting  . Meperidine And Related Nausea And Vomiting  . Vicodin [Hydrocodone-Acetaminophen] Nausea And Vomiting  . Demerol [Meperidine]     Allergies as of 11/19/2016      Reactions   Darvocet [propoxyphene N-acetaminophen] Nausea And Vomiting   Darvon Nausea And Vomiting   Meperidine And Related Nausea And Vomiting   Vicodin [hydrocodone-acetaminophen] Nausea And Vomiting   Demerol [meperidine]       Medication List       Accurate as of 11/19/16  9:49 AM. Always use your most recent med list.          acetaminophen 325 MG tablet Commonly known as:  TYLENOL Take 650 mg by mouth every 4 (four) hours as needed for mild pain, fever or headache.   amoxicillin-clavulanate 250-62.5 MG/5ML suspension Commonly known as:  AUGMENTIN Take 10 mLs (500 mg total) by mouth every 12 (twelve) hours.   anagrelide 1 MG capsule Commonly known as:  AGRYLIN Take 3 mg by mouth daily.   aspirin 81 MG tablet Take 81 mg by mouth daily.   azithromycin 200 MG/5ML suspension Commonly known as:  ZITHROMAX Take 12.5 mLs (500 mg total) by mouth daily.   calcitonin (salmon)  200 UNIT/ACT nasal spray Commonly known as:  MIACALCIN/FORTICAL Place 1 spray into alternate nostrils daily.   CALCIUM 600/VITAMIN D 600-400 MG-UNIT Tabs Generic drug:  Calcium Carbonate-Vitamin D3 Take 1 tablet by mouth 2 (two) times daily with a meal.   donepezil 10 MG tablet Commonly known as:  ARICEPT Take one tablet at bedtime for memory   dorzolamide-timolol 22.3-6.8 MG/ML ophthalmic solution Commonly known as:  COSOPT Place 1 drop into both eyes 2 (two) times daily. 2% - 0.5%.   feeding supplement Liqd Take 90 mLs by mouth 2 (two) times daily between meals.   flurbiprofen 100 MG tablet Commonly known as:  ANSAID TAKE 1 TABLET BY MOUTH EVERY DAY   latanoprost 0.005 % ophthalmic solution Commonly known as:  XALATAN Place 1 drop into both eyes at bedtime.   LORazepam 1 MG tablet Commonly known as:  ATIVAN Take 0.25 mg by mouth as needed for sleep.   Magnesium 250 MG Tabs Take  1 tablet by mouth daily.   memantine 10 MG tablet Commonly known as:  NAMENDA One twice daily to help preserve memory   metoprolol 50 MG tablet Commonly known as:  LOPRESSOR Take 1 tablet (50 mg total) by mouth 2 (two) times daily.   mirtazapine 15 MG tablet Commonly known as:  REMERON Take 30 mg by mouth at bedtime.   polyethylene glycol packet Commonly known as:  MIRALAX / GLYCOLAX Take 17 g by mouth daily.   URELLE 81 MG Tabs tablet Take 1 tablet by mouth at bedtime as needed.   vitamin E 400 UNIT capsule Take 400 Units by mouth daily.       Review of Systems  Constitutional: Positive for activity change, appetite change and fatigue. Negative for diaphoresis, fever and unexpected weight change.  HENT: Positive for voice change (hoarse since September 2014). Negative for congestion, ear discharge, ear pain, hearing loss, postnasal drip, rhinorrhea, sore throat, tinnitus and trouble swallowing.   Eyes: Negative.  Negative for pain, redness, itching and visual disturbance.   Respiratory: Negative for cough, choking, shortness of breath and wheezing.        Pneumonia 11/12/16.  Cardiovascular: Negative.  Negative for chest pain, palpitations and leg swelling.  Gastrointestinal: Negative.  Negative for abdominal distention, abdominal pain, constipation, diarrhea and nausea.  Endocrine: Negative.  Negative for cold intolerance, heat intolerance, polydipsia, polyphagia and polyuria.  Genitourinary: Negative.  Negative for difficulty urinating, dysuria, flank pain, frequency, hematuria, pelvic pain, urgency and vaginal discharge.  Musculoskeletal: Negative.  Negative for arthralgias, back pain, gait problem, myalgias, neck pain and neck stiffness.  Skin: Positive for wound. Negative for color change, pallor and rash.       Left facial laceration, suture closer, no s/s of wound infection.   Allergic/Immunologic: Negative.   Neurological: Negative for dizziness, tremors, seizures, syncope, weakness, numbness and headaches.       Memory loss. Delirium while hospitalized.  Syncope 11/12/16.  Hematological: Negative for adenopathy. Does not bruise/bleed easily.       Thrombocythemia  Psychiatric/Behavioral: Positive for confusion, decreased concentration and sleep disturbance. Negative for agitation, behavioral problems, dysphoric mood, hallucinations and suicidal ideas. The patient is not nervous/anxious and is not hyperactive.     Vitals:   11/19/16 0932  BP: (!) 144/81  Pulse: 81  Resp: 20  Temp: 99.6 F (37.6 C)  SpO2: 93%  Weight: 94 lb (42.6 kg)  Height: 5\' 1"  (1.549 m)   Body mass index is 17.76 kg/m. Physical Exam  Constitutional: She is oriented to person, place, and time. She appears well-developed and well-nourished. No distress.  HENT:  Head: Normocephalic and atraumatic.  Right Ear: External ear normal.  Left Ear: External ear normal.  Nose: Nose normal.  Mouth/Throat: Oropharynx is clear and moist.  Eyes: Conjunctivae and EOM are normal. Pupils  are equal, round, and reactive to light.  Corrective lenses  Neck: Neck supple. No JVD present. No tracheal deviation present. No thyromegaly present.  Cardiovascular: Normal rate and regular rhythm.  Exam reveals no gallop and no friction rub.   Murmur (2/6/LSB SEM) heard. Pulmonary/Chest: No respiratory distress. She has no wheezes. She has no rales.  Abdominal: She exhibits no distension and no mass. There is no tenderness.  Musculoskeletal: She exhibits no edema or tenderness.  Heberden's nodes. Bouchard's nodes. Contracture of the right hand.  Lymphadenopathy:    She has no cervical adenopathy.  Neurological: She is alert and oriented to person, place, and time. She has  normal reflexes. No cranial nerve deficit. Coordination normal.  08/22/2015 MMSE 28/30. Failed clock drawing. 09/17/16 MMSE 25/30. Failed clock drawing. Excessively drowsy and inappropriate answers.  Skin: No rash noted. No erythema. No pallor.  Left facial laceration, suture closer, no s/s of wound infection. Bruise under the left eye corner   Psychiatric: She has a normal mood and affect. Her behavior is normal. Judgment and thought content normal.    Labs reviewed: Basic Metabolic Panel:  Recent Labs  11/13/16 0527  11/15/16 0537 11/16/16 0515 11/17/16 0557  NA 149*  < > 143 150* 150*  K 3.4*  < > 4.0 3.6 3.0*  CL 112*  < > 111 118* 120*  CO2 24  < > 24 22 22   GLUCOSE 109*  < > 96 91 134*  BUN 45*  < > 52* 43* 40*  CREATININE 1.23*  < > 1.50* 1.10* 0.92  CALCIUM 7.9*  < > 7.6* 7.6* 7.9*  MG 2.3  --   --   --   --   < > = values in this interval not displayed. Liver Function Tests:  Recent Labs  09/21/16 1018 10/19/16 0815 11/12/16 0813  AST 17 16 19   ALT 8 10 10*  ALKPHOS 73 63 57  BILITOT 0.47 0.35 0.6  PROT 6.5 6.1* 6.9  ALBUMIN 3.4* 3.2* 3.8    Recent Labs  12/12/15 1732 11/12/16 0813  LIPASE 23 16   No results for input(s): AMMONIA in the last 8760 hours. CBC:  Recent Labs   09/21/16 1018  10/19/16 0815  11/12/16 0813 11/14/16 1519 11/15/16 0537 11/16/16 0515  WBC 5.8  < > 5.3  < > 8.3 10.9* 9.5 11.7*  NEUTROABS 3.7  --  3.4  --  6.7  --   --   --   HGB 8.6*  < > 9.1*  < > 10.1* 9.3* 8.6* 8.8*  HCT 28.1*  < > 29.7*  < > 32.7* 30.2* 28.2* 28.9*  MCV 95.6  --  97.4  --  93.7 95.6 94.9 98.0  PLT 312  < > 326  < > 275 210 204 209  < > = values in this interval not displayed. Cardiac Enzymes: No results for input(s): CKTOTAL, CKMB, CKMBINDEX, TROPONINI in the last 8760 hours. BNP: Invalid input(s): POCBNP No results found for: HGBA1C Lab Results  Component Value Date   TSH 0.850 11/12/2016   Lab Results  Component Value Date   VITAMINB12 342 09/01/2016   No results found for: FOLATE Lab Results  Component Value Date   IRON 87 09/01/2016   TIBC 338 09/01/2016   FERRITIN 106 09/01/2016    Imaging and Procedures obtained prior to SNF admission: Ct Head Wo Contrast  Result Date: 11/12/2016 CLINICAL DATA:  Syncopal episode with fall EXAM: CT HEAD WITHOUT CONTRAST CT MAXILLOFACIAL WITHOUT CONTRAST TECHNIQUE: Multidetector CT imaging of the head and maxillofacial structures were performed using the standard protocol without intravenous contrast. Multiplanar CT image reconstructions of the maxillofacial structures were also generated. COMPARISON:  Head CT January 08, 2016 FINDINGS: CT HEAD FINDINGS Brain: Moderate diffuse atrophy is stable. There is no intracranial mass, hemorrhage, extra-axial fluid collection, or midline shift. There is mild small vessel disease in the centra semiovale bilaterally. Elsewhere gray-white compartments appear normal. No acute infarct evident. Vascular: There is no hyperdense vessel. There is calcification in each carotid siphon. Skull: The bony calvarium appears intact. There is a small benign enostosis arising in the right temporal region, stable.  Other: Mastoid air cells are clear. CT MAXILLOFACIAL FINDINGS Osseous: There is no  evident fracture or dislocation. No blastic or lytic bone lesions are evident. 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 No intraorbital lesions are appreciable. Sinuses: Paranasal sinuses are clear. Ostiomeatal unit complexes are patent bilaterally. There is edema along the inferior aspect of the right nasal turbinate. No nares obstruction. There is minimal rightward deviation of the nasal septum. Soft tissues: There is moderate soft tissue swelling and mild soft tissue air over the left lower to mid face region. No soft tissue mass or abscess present. There is subcutaneous thickening in the area of trauma over the mid face on the left. Salivary glands appear symmetric bilaterally. No adenopathy evident. Tongue and tongue base regions are unremarkable. Visualize pharynx appears normal. There is extensive arthropathy in the visualized cervical spine. IMPRESSION: CT head: Stable moderate generalized atrophy with mild periventricular small vessel disease. No intracranial mass, hemorrhage, or extra-axial fluid collection. No evident acute infarct. Vascular calcification noted in the carotid siphon regions. CT maxillofacial: Soft tissue edema with likely subcutaneous hemorrhage throughout the left lower to mid face region. A small amount of soft tissue air is noted in this region. No acute fracture or dislocation. No intraorbital lesion. Paranasal sinuses are clear. Ostiomeatal unit complexes patent bilaterally. There is slight rightward deviation of the nasal septum. There is extensive arthropathy noted in the visualized cervical spine. 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 Electronically Signed   By: Lowella Grip III M.D.   On:  11/12/2016 16:14   Dg Abd 2 Views  Result Date: 11/12/2016 CLINICAL DATA:  Chronic constipation EXAM: ABDOMEN - 2 VIEW COMPARISON:  None. FINDINGS: Scattered large and small bowel gas is noted. Mild small bowel dilatation is seen. No free air is noted. No acute bony abnormality is seen. Degenerative change of the lumbar spine is noted. IMPRESSION: Mild small bowel dilatation is seen. Correlation with physical exam is recommended. This may represent a partial small bowel obstruction or small-bowel ileus. Electronically Signed   By: Inez Catalina M.D.   On: 11/12/2016 14:46   Ct Maxillofacial Wo Contrast  Result Date: 11/12/2016 CLINICAL DATA:  Syncopal episode with fall EXAM: CT HEAD WITHOUT CONTRAST CT MAXILLOFACIAL WITHOUT CONTRAST TECHNIQUE: Multidetector CT imaging of the head and maxillofacial structures were performed using the standard protocol without intravenous contrast. Multiplanar CT image reconstructions of the maxillofacial structures were also generated. COMPARISON:  Head CT January 08, 2016 FINDINGS: CT HEAD FINDINGS Brain: Moderate diffuse atrophy is stable. There is no intracranial mass, hemorrhage, extra-axial fluid collection, or midline shift. There is mild small vessel disease in the centra semiovale bilaterally. Elsewhere gray-white compartments appear normal. No acute infarct evident. Vascular: There is no hyperdense vessel. There is calcification in each carotid siphon. Skull: The bony calvarium appears intact. There is a small benign enostosis arising in the right temporal region, stable. Other: Mastoid air cells are clear. CT MAXILLOFACIAL FINDINGS Osseous: There is no evident fracture or dislocation. No blastic or lytic bone lesions are evident. 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 No intraorbital lesions are  appreciable. Sinuses: Paranasal sinuses are clear. Ostiomeatal unit complexes  are patent bilaterally. There is edema along the inferior aspect of the right nasal turbinate. No nares obstruction. There is minimal rightward deviation of the nasal septum. Soft tissues: There is moderate soft tissue swelling and mild soft tissue air over the left lower to mid face region. No soft tissue mass or abscess present. There is subcutaneous thickening in the area of trauma over the mid face on the left. Salivary glands appear symmetric bilaterally. No adenopathy evident. Tongue and tongue base regions are unremarkable. Visualize pharynx appears normal. There is extensive arthropathy in the visualized cervical spine. IMPRESSION: CT head: Stable moderate generalized atrophy with mild periventricular small vessel disease. No intracranial mass, hemorrhage, or extra-axial fluid collection. No evident acute infarct. Vascular calcification noted in the carotid siphon regions. CT maxillofacial: Soft tissue edema with likely subcutaneous hemorrhage throughout the left lower to mid face region. A small amount of soft tissue air is noted in this region. No acute fracture or dislocation. No intraorbital lesion. Paranasal sinuses are clear. Ostiomeatal unit complexes patent bilaterally. There is slight rightward deviation of the nasal septum. There is extensive arthropathy noted in the visualized cervical spine. 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 Electronically Signed   By: Lowella Grip III M.D.   On: 11/12/2016 16:14    Assessment/Plan 1. Community acquired pneumonia of right lower lobe of lung ([place]) Finish antibiotic  2. Syncope, unspecified syncope type Etiology not firmly diagnosed, but I suspect it was related to the pneumonia.  3. Protein-calorie malnutrition, severe Hopefully this will wil improve if  her appetite improves. She was already in a FTT situation prior to this most recent illness.  4. Hypokalemia Follow lab  5. Facial laceration, initial encounter imroving  6. Anemia of chronic disease hgb up to 9.1   7. Memory loss Unlikely to improve. She may need LTC in SNF due to this as well as FTT  8. Essential hypertension controlled  9. Dementia without behavioral disturbance, unspecified dementia type Progressive and unlikely to improve  10. Acute delirium continnues to have a problem with alertness. Hopefully this will wear off with time.  11. Essential thrombocythemia ([place]) Controlled with anegrelide  12. Anemia, unspecified type stable  13. Debility Engage with PT and OT.

## 2016-11-19 NOTE — Telephone Encounter (Signed)
Appointments rescheduled per scheduling message. New schedule mailed to patient.

## 2016-11-20 ENCOUNTER — Telehealth: Payer: Self-pay

## 2016-11-20 NOTE — Telephone Encounter (Signed)
[name] [name] with hospice called for help with diagnoses for the pt for her hospice referral. Dr Nyoka Cowden gave hospice referral order. Pt is currently at [facility] skilled nursing facility.

## 2016-11-23 ENCOUNTER — Other Ambulatory Visit: Payer: Medicare Other

## 2016-11-23 ENCOUNTER — Ambulatory Visit: Payer: Medicare Other

## 2016-11-23 ENCOUNTER — Ambulatory Visit: Payer: Medicare Other | Admitting: Hematology

## 2016-12-10 DEATH — deceased

## 2016-12-14 ENCOUNTER — Ambulatory Visit: Payer: Medicare Other

## 2016-12-14 ENCOUNTER — Other Ambulatory Visit: Payer: Medicare Other

## 2016-12-14 ENCOUNTER — Ambulatory Visit: Payer: Medicare Other | Admitting: Hematology

## 2017-01-21 ENCOUNTER — Encounter: Payer: Self-pay | Admitting: Internal Medicine

## 2017-02-19 ENCOUNTER — Other Ambulatory Visit: Payer: Self-pay | Admitting: Nurse Practitioner

## 2017-06-17 IMAGING — CT CT HEAD W/O CM
4 of 6 series · 16 of 47 positions shown, 18 images · non-contrast
Comparison: None.

CLINICAL DATA: Memory loss. Fall earlier today. Question transient
loss of consciousness

EXAM:
CT HEAD WITHOUT CONTRAST
CT CERVICAL SPINE WITHOUT CONTRAST
TECHNIQUE: Multidetector CT imaging of the head and cervical spine was
performed following the standard protocol without intravenous
contrast. Multiplanar CT image reconstructions of the cervical spine
were also generated.

[Series 3: head w/o · axial · non-contrast · 0.43mm/px · z∈[-126,-81]mm · 2 of 29 slices shown]
[im 10/29  brain]
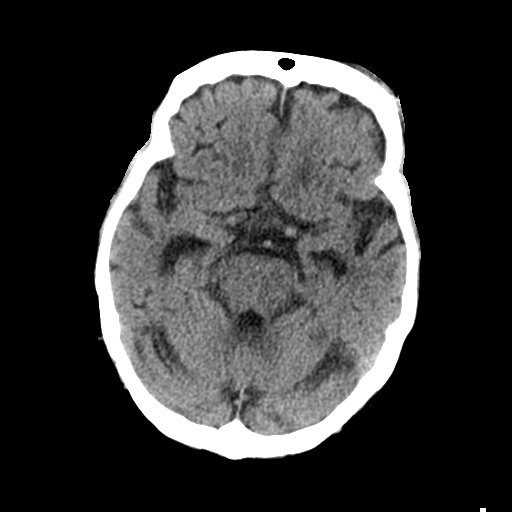
[im 19/29  brain]
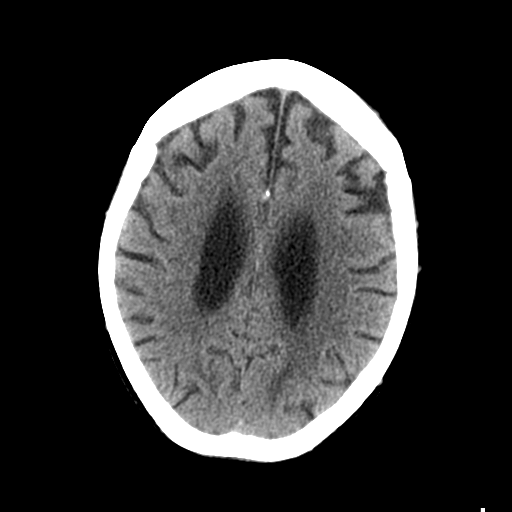

[Series 602: <mpr thick range> · coronal · 0.33mm/px · 3 of 46 slices shown]
[im 16/46  brain]
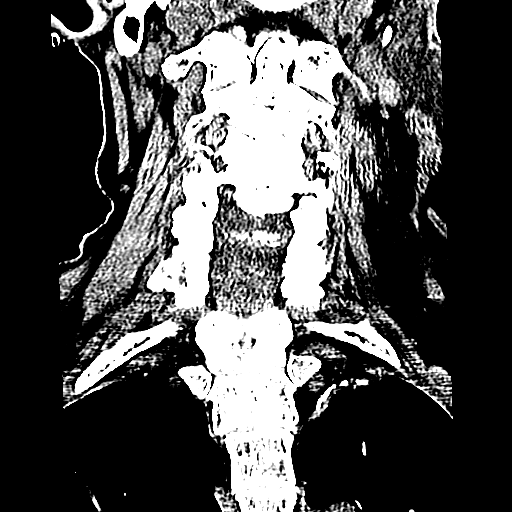
[im 21/46  brain]
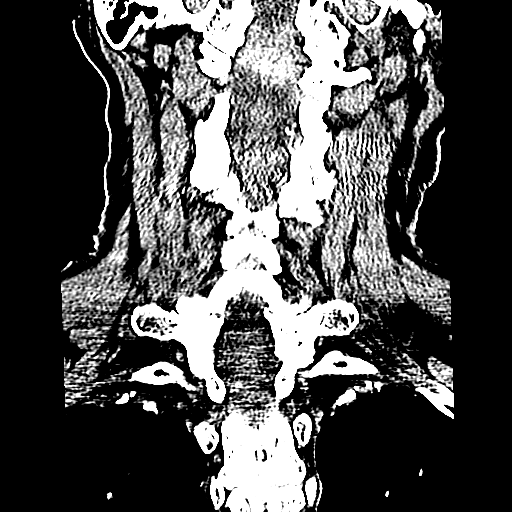
[im 26/46  brain]
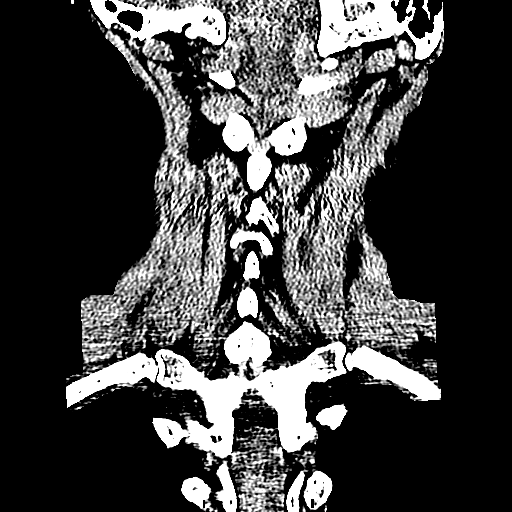

[Series 603: <mpr thick range(1)> · axial · 0.33mm/px · z∈[-322,-206]mm · 8 of 85 slices shown, 10 images]
[im 10/85  brain]
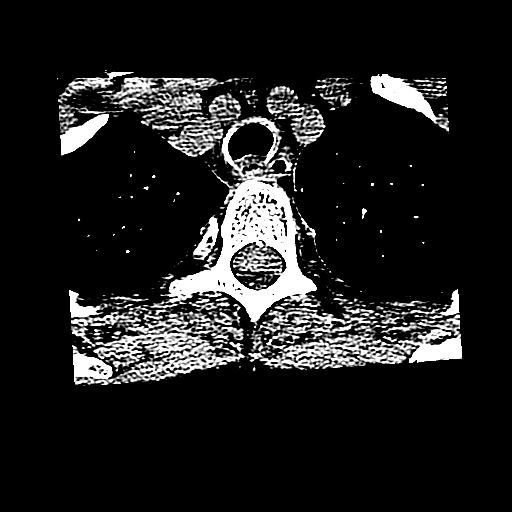
[im 10/85  bone]
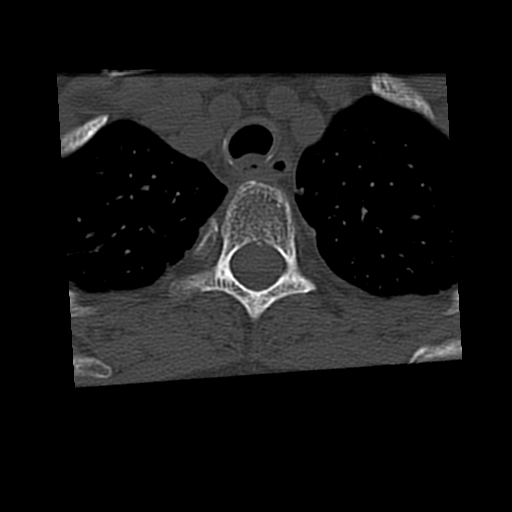
[im 19/85  brain]
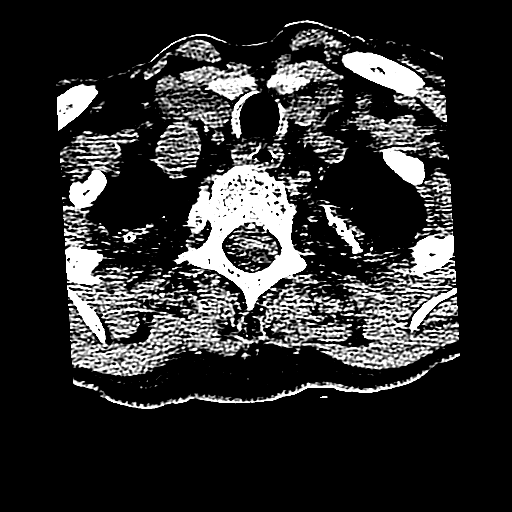
[im 29/85  brain]
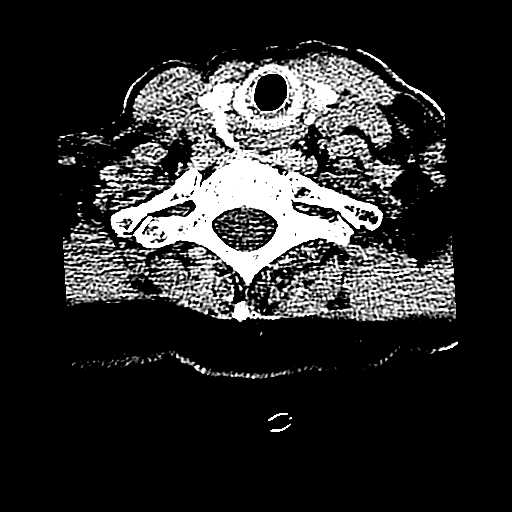
[im 38/85  brain]
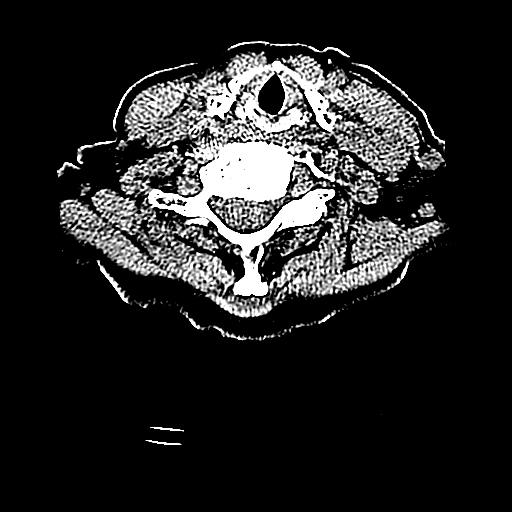
[im 47/85  brain]
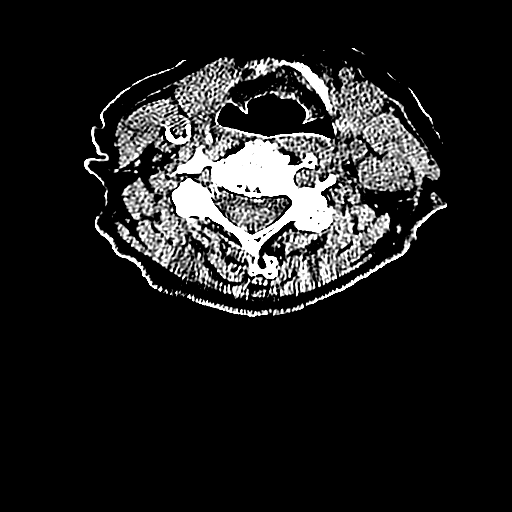
[im 47/85  bone]
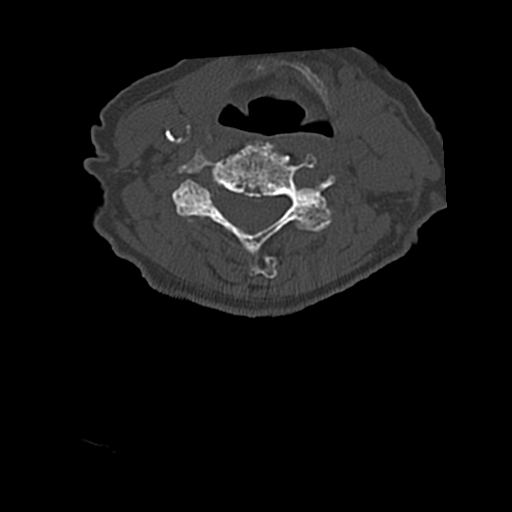
[im 57/85  brain]
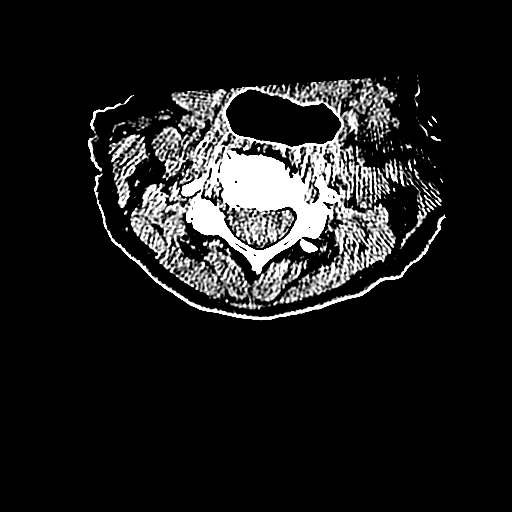
[im 66/85  brain]
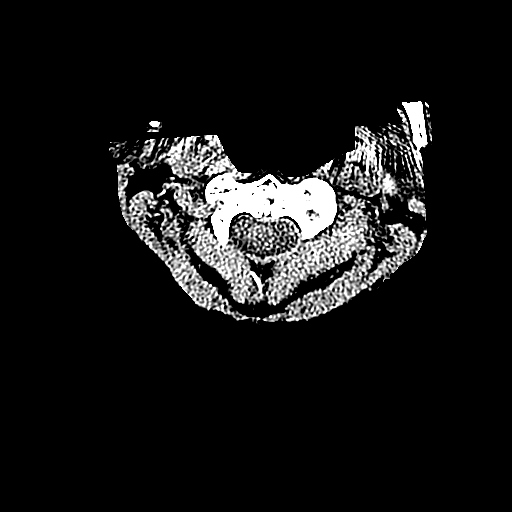
[im 75/85  brain]
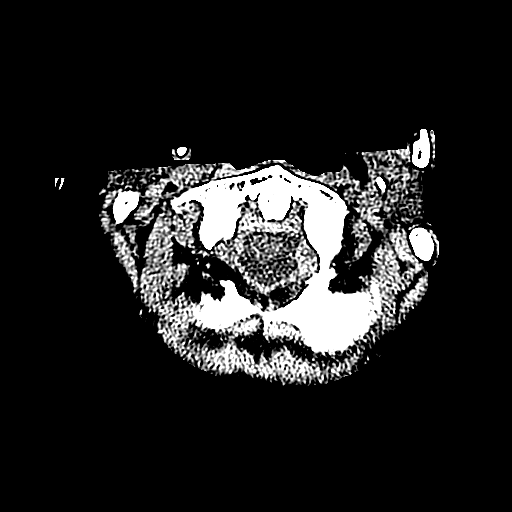

[Series 604: <mpr thick range(2)> · sagittal · 0.33mm/px · 3 of 45 slices shown]
[im 15/45  brain]
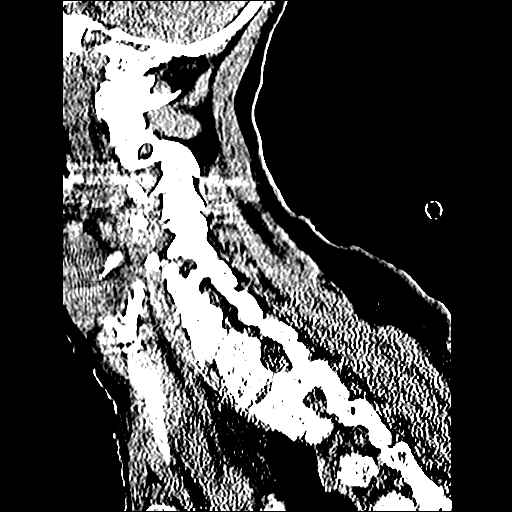
[im 23/45  brain]
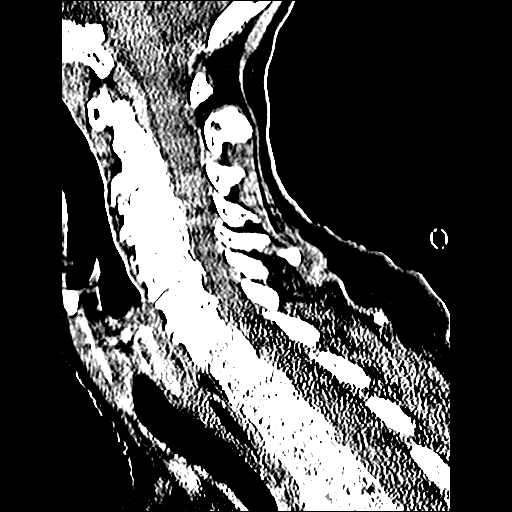
[im 30/45  brain]
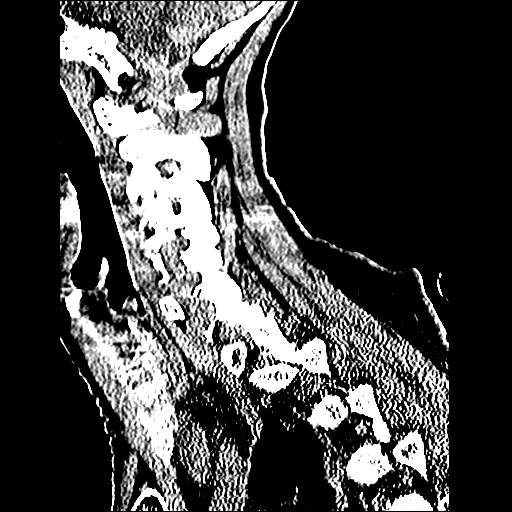

[16 of 47 positions shown; findings below may reference images not displayed]

FINDINGS: CT HEAD FINDINGS

There is moderate diffuse atrophy. There is no intracranial mass,
hemorrhage, extra-axial fluid collection, or midline shift. There is
slight small vessel disease in the centra semiovale bilaterally.
Elsewhere, gray-white compartments appear normal.

No acute infarct evident. There is soft tissue swelling and air
lateral to the superior, lateral left orbital rim. The bony
calvarium in the orbital rim regions appear intact. Mastoid air
cells are clear. No intraorbital lesions are apparent. There is
patchy opacity in several ethmoid air cells bilaterally.

CT CERVICAL SPINE FINDINGS

There is no demonstrable fracture. There is slight anterolisthesis
of C7 on T1. There is no other spondylolisthesis. Prevertebral soft
tissues and predental space regions are normal. There is marked disc
space narrowing at all levels except for C7-T1. There is mild disc
space narrowing at C7-T1. There is apparent ankylosis at C4-5. There
is facet hypertrophy at essentially all levels bilaterally. There is
no disc extrusion or high-grade stenosis.

There is calcification in the both carotid arteries. There is
symmetric apical pleural thickening bilaterally with mild
calcification along the apical pleural surfaces bilaterally. There
are benign-appearing calcifications in each lobe of the thyroid. 11
IMPRESSION: CT head: Atrophy with mild periventricular small vessel disease. No
intracranial mass, hemorrhage, or extra-axial fluid collection. No
acute infarct evident. Soft tissue injury with swelling and air
lateral to the superior, lateral aspect of the left orbit. No
fracture evident. There is patchy ethmoid sinus disease bilaterally.

CT cervical spine: Extensive arthropathy. Slight spondylolisthesis
at C7-T1 is felt to be due to underlying spondylosis. No other
spondylolisthesis. No acute fracture evident. Calcification is noted
in each carotid artery.
